# Patient Record
Sex: Male | Born: 1950 | Race: Black or African American | Hispanic: No | Marital: Married | Smoking: Former smoker
Health system: Southern US, Community
[De-identification: ages and names within clinical notes are randomized; demographics above are authoritative.]

## PROBLEM LIST (undated history)

## (undated) DIAGNOSIS — I2510 Atherosclerotic heart disease of native coronary artery without angina pectoris: Secondary | ICD-10-CM

## (undated) DIAGNOSIS — N529 Male erectile dysfunction, unspecified: Secondary | ICD-10-CM

## (undated) DIAGNOSIS — I739 Peripheral vascular disease, unspecified: Secondary | ICD-10-CM

## (undated) DIAGNOSIS — E785 Hyperlipidemia, unspecified: Secondary | ICD-10-CM

## (undated) DIAGNOSIS — M79609 Pain in unspecified limb: Secondary | ICD-10-CM

## (undated) DIAGNOSIS — I10 Essential (primary) hypertension: Secondary | ICD-10-CM

## (undated) DIAGNOSIS — K5900 Constipation, unspecified: Secondary | ICD-10-CM

## (undated) DIAGNOSIS — G709 Myoneural disorder, unspecified: Secondary | ICD-10-CM

## (undated) HISTORY — DX: Pain in unspecified limb: M79.609

## (undated) HISTORY — DX: Hyperlipidemia, unspecified: E78.5

## (undated) HISTORY — DX: Atherosclerotic heart disease of native coronary artery without angina pectoris: I25.10

## (undated) HISTORY — DX: Male erectile dysfunction, unspecified: N52.9

## (undated) HISTORY — DX: Essential (primary) hypertension: I10

## (undated) HISTORY — PX: BACK SURGERY: SHX140

## (undated) HISTORY — DX: Myoneural disorder, unspecified: G70.9

## (undated) HISTORY — DX: Constipation, unspecified: K59.00

## (undated) HISTORY — DX: Peripheral vascular disease, unspecified: I73.9

## (undated) HISTORY — PX: LUMBAR DISC SURGERY: SHX700

---

## 2002-03-02 ENCOUNTER — Encounter: Admission: RE | Admit: 2002-03-02 | Discharge: 2002-03-02 | Payer: Self-pay | Admitting: Family Medicine

## 2002-03-02 ENCOUNTER — Encounter: Payer: Self-pay | Admitting: Family Medicine

## 2002-03-08 ENCOUNTER — Encounter: Payer: Self-pay | Admitting: Family Medicine

## 2002-03-08 ENCOUNTER — Ambulatory Visit (HOSPITAL_COMMUNITY): Admission: RE | Admit: 2002-03-08 | Discharge: 2002-03-08 | Payer: Self-pay | Admitting: Family Medicine

## 2006-11-13 ENCOUNTER — Emergency Department (HOSPITAL_COMMUNITY): Admission: EM | Admit: 2006-11-13 | Discharge: 2006-11-13 | Payer: Self-pay | Admitting: Family Medicine

## 2010-04-09 ENCOUNTER — Emergency Department (HOSPITAL_COMMUNITY): Admission: EM | Admit: 2010-04-09 | Discharge: 2010-04-09 | Payer: Self-pay | Admitting: Family Medicine

## 2010-04-19 ENCOUNTER — Emergency Department (HOSPITAL_COMMUNITY): Admission: EM | Admit: 2010-04-19 | Discharge: 2010-04-19 | Payer: Self-pay | Admitting: Family Medicine

## 2010-05-01 ENCOUNTER — Ambulatory Visit (HOSPITAL_COMMUNITY): Admission: RE | Admit: 2010-05-01 | Discharge: 2010-05-01 | Payer: Self-pay | Admitting: *Deleted

## 2011-07-09 ENCOUNTER — Inpatient Hospital Stay (INDEPENDENT_AMBULATORY_CARE_PROVIDER_SITE_OTHER)
Admission: RE | Admit: 2011-07-09 | Discharge: 2011-07-09 | Disposition: A | Discharge status: Home / Self Care | Payer: BC Managed Care – PPO | Source: Ambulatory Visit | Attending: Family Medicine | Admitting: Family Medicine

## 2011-07-09 DIAGNOSIS — S058X9A Other injuries of unspecified eye and orbit, initial encounter: Secondary | ICD-10-CM

## 2013-05-17 ENCOUNTER — Encounter: Payer: Self-pay | Admitting: Vascular Surgery

## 2013-05-23 ENCOUNTER — Encounter: Payer: Self-pay | Admitting: Vascular Surgery

## 2013-05-24 ENCOUNTER — Encounter: Payer: Self-pay | Admitting: Vascular Surgery

## 2013-05-24 ENCOUNTER — Ambulatory Visit (INDEPENDENT_AMBULATORY_CARE_PROVIDER_SITE_OTHER): Payer: BC Managed Care – PPO | Admitting: Vascular Surgery

## 2013-05-24 VITALS — BP 139/86 | HR 87 | Resp 16 | Ht 68.0 in | Wt 155.0 lb

## 2013-05-24 DIAGNOSIS — I739 Peripheral vascular disease, unspecified: Secondary | ICD-10-CM

## 2013-05-24 DIAGNOSIS — I70219 Atherosclerosis of native arteries of extremities with intermittent claudication, unspecified extremity: Secondary | ICD-10-CM

## 2013-05-24 DIAGNOSIS — Z01810 Encounter for preprocedural cardiovascular examination: Secondary | ICD-10-CM

## 2013-05-24 NOTE — Progress Notes (Signed)
Subjective:     Patient ID: Tyrone Walton, male   DOB: 05/06/1951, 62 y.o.   MRN: [id number]  HPI this 62 year old male was referred by Kathrin Penner practitioner for evaluation of lower extremity occlusive disease. Patient states that he has been having bilateral calf weakness with ambulation over the last several years which has become worse in the past year. He denies rest pain but does have numbness and tingling in his feet frequently. He has no history of nonhealing ulcers or gangrene. He is able to ambulate 1-2 blocks and this does not seem to be limiting him currently. He also states that he does get chest pain on occasion while mowing the lawn which is relieved by rest. He has no diagnosis of coronary artery disease and has no history of myocardial infarction. He had ABIs performed at [facility] office but those are not available today and nor are they in Epic  Past Medical History  Diagnosis Date  . Peripheral vascular disease   . Hypertension   . Arthritis     Gout  . CAD (coronary artery disease)   . Hyperlipidemia   . Pain in limb   . Constipation   . ED (erectile dysfunction)     History  Substance Use Topics  . Smoking status: Not on file  . Smokeless tobacco: Not on file  . Alcohol Use: Not on file    No family history on file.  No Known Allergies  Current outpatient prescriptions:atorvastatin (LIPITOR) 10 MG tablet, Take 10 mg by mouth daily., Disp: , Rfl: ;  cholecalciferol (VITAMIN D) 400 UNITS TABS tablet, Take 400 Units by mouth every other day., Disp: , Rfl: ;  tadalafil (CIALIS) 20 MG tablet, Take 20 mg by mouth daily as needed for erectile dysfunction., Disp: , Rfl: ;  valsartan-hydrochlorothiazide (DIOVAN-HCT) 320-25 MG per tablet, Take 1 tablet by mouth daily., Disp: , Rfl:   BP 139/86  Pulse 87  Resp 16  Ht 5\' 8"  (1.727 m)  Wt 155 lb (70.308 kg)  BMI 23.57 kg/m2  Body mass index is 23.57 kg/(m^2).          Review of Systems  complains of mild chest discomfort with exertion but denies dyspnea on exertion, PND, orthopnea, hemoptysis, productive cough. All other systems are negative and a complete review of systems except for history of lumbar spine disease    Objective:   Physical Exam BP 139/86  Pulse 87  Resp 16  Ht 5\' 8"  (1.727 m)  Wt 155 lb (70.308 kg)  BMI 23.57 kg/m2  Gen.-alert and oriented x3 in no apparent distress HEENT normal for age Lungs no rhonchi or wheezing Cardiovascular regular rhythm no murmurs carotid pulses 3+ palpable no bruits audible Abdomen soft nontender no palpable masses Musculoskeletal free of  major deformities Skin clear -no rashes Neurologic normal Lower extremities 2+ femoral pulses palpable bilaterally. No popliteal or distal pulses palpable bilaterally. 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 Suspect coronary artery disease with possible angina pectoris    Plan:     #1 we'll obtain Cardiolite nuclear medicine scan #2 encourage patient to ambulate as much as possible over the next 3 months #3 encouraged patient to discontinue smoking because of the diffuse atherosclerosis which he  has #4 return in 3 months with repeat ABIs and duplex scan of aortoiliac and femoral popliteal system bilaterally If patient's symptoms become worse or he develops any infection or ulceration he will be in touch with [place] Patient is to notify Debria Garret nurse practitioner if he has any more chest discomfort for further evaluation and in the meantime we'll be obtaining the Cardiolite exam

## 2013-05-25 NOTE — Addendum Note (Signed)
Addended by: Sharee Pimple on: 05/25/2013 09:15 AM   Modules accepted: Orders

## 2013-06-01 ENCOUNTER — Ambulatory Visit (HOSPITAL_COMMUNITY): Payer: BC Managed Care – PPO | Attending: Cardiovascular Disease | Admitting: Radiology

## 2013-06-01 VITALS — BP 107/64 | Ht 68.0 in | Wt 148.0 lb

## 2013-06-01 DIAGNOSIS — F17200 Nicotine dependence, unspecified, uncomplicated: Secondary | ICD-10-CM | POA: Insufficient documentation

## 2013-06-01 DIAGNOSIS — I70219 Atherosclerosis of native arteries of extremities with intermittent claudication, unspecified extremity: Secondary | ICD-10-CM

## 2013-06-01 DIAGNOSIS — Z01810 Encounter for preprocedural cardiovascular examination: Secondary | ICD-10-CM | POA: Insufficient documentation

## 2013-06-01 DIAGNOSIS — R079 Chest pain, unspecified: Secondary | ICD-10-CM | POA: Insufficient documentation

## 2013-06-01 DIAGNOSIS — I10 Essential (primary) hypertension: Secondary | ICD-10-CM | POA: Insufficient documentation

## 2013-06-01 DIAGNOSIS — E785 Hyperlipidemia, unspecified: Secondary | ICD-10-CM | POA: Insufficient documentation

## 2013-06-01 DIAGNOSIS — I739 Peripheral vascular disease, unspecified: Secondary | ICD-10-CM | POA: Insufficient documentation

## 2013-06-01 MED ORDER — TECHNETIUM TC 99M SESTAMIBI GENERIC - CARDIOLITE
10.0000 | Freq: Once | INTRAVENOUS | Status: AC | PRN
  Administered 2013-06-01: 10 via INTRAVENOUS

## 2013-06-01 MED ORDER — TECHNETIUM TC 99M SESTAMIBI GENERIC - CARDIOLITE
30.0000 | Freq: Once | INTRAVENOUS | Status: AC | PRN
  Administered 2013-06-01: 30 via INTRAVENOUS

## 2013-06-01 MED ORDER — REGADENOSON 0.4 MG/5ML IV SOLN
0.4000 mg | Freq: Once | INTRAVENOUS | Status: AC
  Administered 2013-06-01: 0.4 mg via INTRAVENOUS

## 2013-06-01 NOTE — Progress Notes (Signed)
[facility] SITE 3 NUCLEAR MED [address] [place], [place] [address] [phone]    Cardiology Nuclear Med Study  Tyrone Walton is a 62 y.o. male     MRN : [id number]     DOB: 03/27/1951  Procedure Date: 06/01/2013  Nuclear Med Background Indication for Stress Test:  Evaluation for Ischemia, and Surgical Clearance for  Potential Lower Extremity Vascular Surgery by Josephina Gip, MD History:  No hx of CAD Cardiac Risk Factors: Claudication, Hypertension, Lipids, PVD and Smoker  Symptoms:  Chest Pain   Nuclear Pre-Procedure Caffeine/Decaff Intake:  None > 12 hrs NPO After: 8:30pm   Lungs:  clear O2 Sat: 99% on room air. IV 0.9% NS with Angio Cath:  20g  IV Site: R Antecubital x 1, tolerated well IV Started by:  Irean Hong, RN  Chest Size (in):  42 Cup Size: n/a  Height: 5\' 8"  (1.727 m)  Weight:  148 lb (67.132 kg)  BMI:  Body mass index is 22.51 kg/(m^2). Tech Comments:  n/a    Nuclear Med Study 1 or 2 day study: 1 day  Stress Test Type:  Lexiscan  Reading MD: Charlton Haws, MD  Order Authorizing Provider:  Josephina Gip, MD  Resting Radionuclide: Technetium 51m Sestamibi  Resting Radionuclide Dose: 11.0 mCi   Stress Radionuclide:  Technetium 58m Sestamibi  Stress Radionuclide Dose: 33.0 mCi           Stress Protocol Rest HR: 56 Stress HR: 112  Rest BP: 107/64 Stress BP: 124/59  Exercise Time (min): 2:00 METS: 1.6   Predicted Max HR: 158 bpm % Max HR: 72.15 bpm Rate Pressure Product: 69629   Dose of Adenosine (mg):  n/a Dose of Lexiscan: 0.4 mg  Dose of Atropine (mg): n/a Dose of Dobutamine: n/a mcg/kg/min (at max HR)  Stress Test Technologist: Milana Na, EMT-P  Nuclear Technologist:  Doyne Keel, CNMT     Rest Procedure:  Myocardial perfusion imaging was performed at rest 45 minutes following the intravenous administration of Technetium 37m Sestamibi. Rest ECG: NSR - Normal EKG  Stress Procedure:  The patient received IV Lexiscan 0.4 mg  over 15-seconds with concurrent low level exercise and then Technetium 99m Sestamibi was injected at 30-seconds while the patient continued walking one more minute.  Quantitative spect images were obtained after a 45-minute delay. Stress ECG: No significant change from baseline ECG  QPS Raw Data Images:  Normal; no motion artifact; normal heart/lung ratio. Stress Images:  Normal homogeneous uptake in all areas of the myocardium. Rest Images:  Normal homogeneous uptake in all areas of the myocardium. Subtraction (SDS):  No evidence of ischemia. Transient Ischemic Dilatation (Normal <1.22):  n/a Lung/Heart Ratio (Normal <0.45):  0.35  Quantitative Gated Spect Images QGS EDV:  85 ml QGS ESV:  37 ml  Impression Exercise Capacity:  Lexiscan with low level exercise. BP Response:  Normal blood pressure response. Clinical Symptoms:  Nausea and throat tightness ECG Impression:  No significant ST segment change suggestive of ischemia. Comparison with Prior Nuclear Study: No images to compare  Overall Impression:  Normal stress nuclear study.  LV Ejection Fraction: 56%.  LV Wall Motion:  NL LV Function; NL Wall Motion   Charlton Haws

## 2013-06-06 ENCOUNTER — Encounter (HOSPITAL_COMMUNITY): Payer: BC Managed Care – PPO

## 2013-06-06 ENCOUNTER — Other Ambulatory Visit: Payer: Self-pay | Admitting: *Deleted

## 2013-06-06 DIAGNOSIS — Z48812 Encounter for surgical aftercare following surgery on the circulatory system: Secondary | ICD-10-CM

## 2013-06-06 DIAGNOSIS — I739 Peripheral vascular disease, unspecified: Secondary | ICD-10-CM

## 2013-08-12 ENCOUNTER — Other Ambulatory Visit: Payer: Self-pay | Admitting: Vascular Surgery

## 2013-08-12 DIAGNOSIS — I70219 Atherosclerosis of native arteries of extremities with intermittent claudication, unspecified extremity: Secondary | ICD-10-CM

## 2013-08-23 ENCOUNTER — Ambulatory Visit: Payer: BC Managed Care – PPO | Admitting: Vascular Surgery

## 2013-08-23 ENCOUNTER — Encounter (HOSPITAL_COMMUNITY): Payer: BC Managed Care – PPO

## 2013-08-23 ENCOUNTER — Other Ambulatory Visit (HOSPITAL_COMMUNITY): Payer: BC Managed Care – PPO

## 2013-08-29 ENCOUNTER — Encounter: Payer: Self-pay | Admitting: Vascular Surgery

## 2013-08-30 ENCOUNTER — Ambulatory Visit (INDEPENDENT_AMBULATORY_CARE_PROVIDER_SITE_OTHER): Payer: BC Managed Care – PPO | Admitting: Vascular Surgery

## 2013-08-30 ENCOUNTER — Ambulatory Visit: Payer: BC Managed Care – PPO | Admitting: Vascular Surgery

## 2013-08-30 ENCOUNTER — Ambulatory Visit (INDEPENDENT_AMBULATORY_CARE_PROVIDER_SITE_OTHER)
Admission: RE | Admit: 2013-08-30 | Discharge: 2013-08-30 | Disposition: A | Discharge status: Home / Self Care | Payer: BC Managed Care – PPO | Source: Ambulatory Visit | Attending: Vascular Surgery | Admitting: Vascular Surgery

## 2013-08-30 ENCOUNTER — Ambulatory Visit (HOSPITAL_COMMUNITY)
Admission: RE | Admit: 2013-08-30 | Discharge: 2013-08-30 | Disposition: A | Discharge status: Home / Self Care | Payer: BC Managed Care – PPO | Source: Ambulatory Visit | Attending: Vascular Surgery | Admitting: Vascular Surgery

## 2013-08-30 ENCOUNTER — Encounter (INDEPENDENT_AMBULATORY_CARE_PROVIDER_SITE_OTHER): Payer: Self-pay

## 2013-08-30 ENCOUNTER — Encounter: Payer: Self-pay | Admitting: Vascular Surgery

## 2013-08-30 VITALS — BP 141/86 | HR 66 | Ht 68.0 in | Wt 154.5 lb

## 2013-08-30 DIAGNOSIS — I739 Peripheral vascular disease, unspecified: Secondary | ICD-10-CM

## 2013-08-30 DIAGNOSIS — I70219 Atherosclerosis of native arteries of extremities with intermittent claudication, unspecified extremity: Secondary | ICD-10-CM

## 2013-08-30 DIAGNOSIS — Z48812 Encounter for surgical aftercare following surgery on the circulatory system: Secondary | ICD-10-CM | POA: Insufficient documentation

## 2013-08-30 NOTE — Progress Notes (Signed)
Subjective:     Patient ID: Tyrone Walton, male   DOB: 03-Jul-1951, 62 y.o.   MRN: [id number]  HPI this 62 year old male returns for continued followup regarding his bilateral calf claudication. He states his right leg is worse in his left he is able to ambulate about one block. He denies rest pain or nonhealing ulcers. He does continue to smoke one pack of cigarettes per day. He does have a history of occasional chest discomfort but a Cardiolite study was obtained over the last few months and it was normal with a good ejection fraction and no evidence of ischemia.  Past Medical History  Diagnosis Date  . Peripheral vascular disease   . Hypertension   . Arthritis     Gout  . CAD (coronary artery disease)   . Hyperlipidemia   . Pain in limb   . Constipation   . ED (erectile dysfunction)     History  Substance Use Topics  . Smoking status: Current Every Day Smoker -- 1.00 packs/day for 47 years    Types: Cigarettes  . Smokeless tobacco: Never Used  . Alcohol Use: 7.2 oz/week    12 Cans of beer per week    No family history on file.  No Known Allergies  Current outpatient prescriptions:atorvastatin (LIPITOR) 10 MG tablet, Take 10 mg by mouth daily., Disp: , Rfl: ;  cholecalciferol (VITAMIN D) 400 UNITS TABS tablet, Take 400 Units by mouth every other day., Disp: , Rfl: ;  cyclobenzaprine (FLEXERIL) 5 MG tablet, Take 5 mg by mouth as needed for muscle spasms., Disp: , Rfl: ;  tadalafil (CIALIS) 20 MG tablet, Take 20 mg by mouth daily as needed for erectile dysfunction., Disp: , Rfl:  valsartan-hydrochlorothiazide (DIOVAN-HCT) 320-25 MG per tablet, Take 1 tablet by mouth daily., Disp: , Rfl:   BP 141/86  Pulse 66  Ht 5\' 8"  (1.727 m)  Wt 154 lb 8 oz (70.081 kg)  BMI 23.50 kg/m2  SpO2 100%  Body mass index is 23.5 kg/(m^2).           Review of Systems denies dyspnea on exertion hip or thigh claudication. Occasional chest discomfort. His pain left heel from a callus.  Other systems negative complete review of systems    Objective:   Physical Exam BP 141/86  Pulse 66  Ht 5\' 8"  (1.727 m)  Wt 154 lb 8 oz (70.081 kg)  BMI 23.50 kg/m2  SpO2 100%  Gen.-alert and oriented x3 in no apparent distress HEENT normal for age Lungs no rhonchi or wheezing Cardiovascular regular rhythm no murmurs carotid pulses 3+ palpable no bruits audible Abdomen soft nontender no palpable masses Musculoskeletal free of  major deformities Skin clear -no rashes Neurologic normal Lower extremities -2-3+ femoral pulses palpable bilaterally. No popliteal or distal pulses. No evidence of ischemia of lower extremities. No infection or gangrene.  Today I ordered bilateral ABIs and duplex scan of the lower extremities which are reviewed and interpreted. ABI on the right is 0.76 normal left is 0.6. It appears from duplex scanning of the left superficial femoral artery is totally occluded in the mid thigh in the right superficial femoral artery has diffuse disease less than 50% in severity. Aortoiliac system appears widely patent.        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 Patient will return in 6 months for  repeat ABIs unless his symptoms become disabling and will make concerted effort to discontinue smoking    Plan:

## 2013-09-07 NOTE — Addendum Note (Signed)
Addended by: Melodye Ped C on: 09/07/2013 11:45 AM   Modules accepted: Orders

## 2014-02-24 ENCOUNTER — Encounter: Payer: Self-pay | Admitting: Family

## 2014-02-27 ENCOUNTER — Ambulatory Visit: Payer: BC Managed Care – PPO | Admitting: Family

## 2014-02-27 ENCOUNTER — Encounter (HOSPITAL_COMMUNITY): Payer: BC Managed Care – PPO

## 2014-10-16 ENCOUNTER — Emergency Department (HOSPITAL_COMMUNITY)
Admission: EM | Admit: 2014-10-16 | Discharge: 2014-10-16 | Disposition: A | Discharge status: Home / Self Care | Payer: BC Managed Care – PPO | Attending: Emergency Medicine | Admitting: Emergency Medicine

## 2014-10-16 ENCOUNTER — Encounter (HOSPITAL_COMMUNITY): Payer: Self-pay | Admitting: Cardiology

## 2014-10-16 DIAGNOSIS — E785 Hyperlipidemia, unspecified: Secondary | ICD-10-CM | POA: Insufficient documentation

## 2014-10-16 DIAGNOSIS — Y9289 Other specified places as the place of occurrence of the external cause: Secondary | ICD-10-CM | POA: Insufficient documentation

## 2014-10-16 DIAGNOSIS — Y9389 Activity, other specified: Secondary | ICD-10-CM | POA: Insufficient documentation

## 2014-10-16 DIAGNOSIS — X58XXXA Exposure to other specified factors, initial encounter: Secondary | ICD-10-CM | POA: Insufficient documentation

## 2014-10-16 DIAGNOSIS — Z87438 Personal history of other diseases of male genital organs: Secondary | ICD-10-CM | POA: Insufficient documentation

## 2014-10-16 DIAGNOSIS — Z8719 Personal history of other diseases of the digestive system: Secondary | ICD-10-CM | POA: Diagnosis not present

## 2014-10-16 DIAGNOSIS — Y990 Civilian activity done for income or pay: Secondary | ICD-10-CM | POA: Diagnosis not present

## 2014-10-16 DIAGNOSIS — Z79899 Other long term (current) drug therapy: Secondary | ICD-10-CM | POA: Insufficient documentation

## 2014-10-16 DIAGNOSIS — I2510 Atherosclerotic heart disease of native coronary artery without angina pectoris: Secondary | ICD-10-CM | POA: Diagnosis not present

## 2014-10-16 DIAGNOSIS — Z720 Tobacco use: Secondary | ICD-10-CM | POA: Insufficient documentation

## 2014-10-16 DIAGNOSIS — S3992XA Unspecified injury of lower back, initial encounter: Secondary | ICD-10-CM | POA: Diagnosis present

## 2014-10-16 DIAGNOSIS — I10 Essential (primary) hypertension: Secondary | ICD-10-CM | POA: Insufficient documentation

## 2014-10-16 DIAGNOSIS — Z8739 Personal history of other diseases of the musculoskeletal system and connective tissue: Secondary | ICD-10-CM | POA: Insufficient documentation

## 2014-10-16 DIAGNOSIS — S29012A Strain of muscle and tendon of back wall of thorax, initial encounter: Secondary | ICD-10-CM | POA: Insufficient documentation

## 2014-10-16 MED ORDER — CYCLOBENZAPRINE HCL 10 MG PO TABS: 10.0000 mg | ORAL_TABLET | Freq: Two times a day (BID) | ORAL | Status: DC | PRN

## 2014-10-16 MED ORDER — NAPROXEN 250 MG PO TABS
500.0000 mg | ORAL_TABLET | Freq: Once | ORAL | Status: AC
  Administered 2014-10-16: 500 mg via ORAL
  Filled 2014-10-16: qty 2

## 2014-10-16 MED ORDER — NAPROXEN 500 MG PO TABS: 500.0000 mg | ORAL_TABLET | Freq: Two times a day (BID) | ORAL | Status: DC

## 2014-10-16 MED ORDER — CYCLOBENZAPRINE HCL 10 MG PO TABS
10.0000 mg | ORAL_TABLET | Freq: Once | ORAL | Status: AC
  Administered 2014-10-16: 10 mg via ORAL
  Filled 2014-10-16: qty 1

## 2014-10-16 NOTE — ED Notes (Signed)
Declined W/C at D/C and was escorted to lobby by RN. 

## 2014-10-16 NOTE — ED Provider Notes (Signed)
CSN: [id number]     Arrival date & time 10/16/14  1341 History  This chart was scribed for non-physician practitioner, Clemens Catholic, NP working with Veryl Speak, MD, by Erling Conte, ED Scribe. This patient was seen in room TR05C/TR05C and the patient's care was started at 2:18 PM.    Chief Complaint  Patient presents with  . Back Pain    The history is provided by the patient. No language interpreter was used.    HPI Comments: Tyrone Walton is a 64 y.o. male with a h/o PVD, HTN, arthritis, CAD, and hyperlipidemia, who presents to the Emergency Department complaining of intermittent, moderate, "sharp", "7/10", back pain that began 10 days ago. Pt states that he felt his muscle pull at work 10 days ago while he was moving to pick something up. He states that the pain is exacerbated with certain movements. He notes he took some BC powder which provided relief. He notes that he has been moving furniture over the past week and he went to work yesterday and the back pain began to hurt again. Pt has a h/o swollen prostate He has been taking muscle relaxer's around 4 hours ago, that he got from his brother in law with relief. . He has also been applying a heating pad with some relief. Pt is able to walk. He denies any numbness, weakness, fever, chills, urinary or bowel incontinence, or saddle paresthesia.      Past Medical History  Diagnosis Date  . Peripheral vascular disease   . Hypertension   . Arthritis     Gout  . CAD (coronary artery disease)   . Hyperlipidemia   . Pain in limb   . Constipation   . ED (erectile dysfunction)    Past Surgical History  Procedure Laterality Date  . Back surgery     History reviewed. No pertinent family history. History  Substance Use Topics  . Smoking status: Current Every Day Smoker -- 1.00 packs/day for 47 years    Types: Cigarettes  . Smokeless tobacco: Never Used  . Alcohol Use: 7.2 oz/week    12 Cans of beer per week    Review of  Systems  Constitutional: Negative for fever and chills.  Gastrointestinal:       No bowel incontinence  Genitourinary:       No urinary incontinence  Musculoskeletal: Positive for back pain. Negative for gait problem.  Neurological: Negative for weakness and numbness.      Allergies  Review of patient's allergies indicates no known allergies.  Home Medications   Prior to Admission medications   Medication Sig Start Date End Date Taking? Authorizing Provider  atorvastatin (LIPITOR) 10 MG tablet Take 10 mg by mouth daily.    Historical Provider, MD  cholecalciferol (VITAMIN D) 400 UNITS TABS tablet Take 400 Units by mouth every other day.    Historical Provider, MD  cyclobenzaprine (FLEXERIL) 5 MG tablet Take 5 mg by mouth as needed for muscle spasms.    Historical Provider, MD  tadalafil (CIALIS) 20 MG tablet Take 20 mg by mouth daily as needed for erectile dysfunction.    Historical Provider, MD  valsartan-hydrochlorothiazide (DIOVAN-HCT) 320-25 MG per tablet Take 1 tablet by mouth daily.    Historical Provider, MD   Triage Vitals: BP 142/88 mmHg  Pulse 86  Temp(Src) 97.9 F (36.6 C) (Oral)  Resp 18  Ht 5\' 8"  (1.727 m)  Wt 150 lb (68.04 kg)  BMI 22.81 kg/m2  SpO2 100%  Physical Exam  Constitutional: He is oriented to person, place, and time. He appears well-developed and well-nourished. No distress.  HENT:  Head: Normocephalic and atraumatic.  Eyes: Conjunctivae and EOM are normal.  Neck: Neck supple. No tracheal deviation present.  Cardiovascular: Normal rate.   Pulmonary/Chest: Effort normal. No respiratory distress.  Musculoskeletal: Normal range of motion.  Tenderness to palpation of left latissimus dorsi. 5/5 strength plantar and dorsiflexion  Neurological: He is alert and oriented to person, place, and time.  Skin: Skin is warm and dry.  Psychiatric: He has a normal mood and affect. His behavior is normal.  Nursing note and vitals reviewed.   ED Course   Procedures (including critical care time)  DIAGNOSTIC STUDIES: Oxygen Saturation is 100% on RA, normal by my interpretation.    COORDINATION OF CARE: 2:24 PM-  Advised pt to take prescribed medications and continue applying heat.  Pt advised of plan for treatment and pt agrees.   Labs Review Labs Reviewed - No data to display  Imaging Review No results found.    EKG Interpretation None      MDM   Final diagnoses:  Strain of latissimus dorsi muscle, initial encounter   64 yo with history and exam consistent with muscle strain.  He has exquisite and reproducible tenderness to left latissimus dorsi muscle.  He has no radiation of pain, no abd pain, no bony tenderness, and no complaints of pain elsewhere. Discussed rest, NSAIDS and muscle relaxant.  Pt is well-appearing, in no acute distress and vital signs are stable.  They appear safe to be discharged.  Discharge include follow-up with their PCP.  Return precautions provided.   I personally performed the services described in this documentation, which was scribed in my presence. The recorded information has been reviewed and is accurate.  Filed Vitals:   10/16/14 1351  BP: 142/88  Pulse: 86  Temp: 97.9 F (36.6 C)  TempSrc: Oral  Resp: 18  Height: 5\' 8"  (1.727 m)  Weight: 150 lb (68.04 kg)  SpO2: 100%   Meds given in ED:  Medications  naproxen (NAPROSYN) tablet 500 mg (500 mg Oral Given 10/16/14 1448)  cyclobenzaprine (FLEXERIL) tablet 10 mg (10 mg Oral Given 10/16/14 1448)    Discharge Medication List as of 10/16/2014  2:36 PM    START taking these medications   Details  !! cyclobenzaprine (FLEXERIL) 10 MG tablet Take 1 tablet (10 mg total) by mouth 2 (two) times daily as needed for muscle spasms., Starting 10/16/2014, Until Discontinued, Print    naproxen (NAPROSYN) 500 MG tablet Take 1 tablet (500 mg total) by mouth 2 (two) times daily., Starting 10/16/2014, Until Discontinued, Print     !! - Potential duplicate  medications found. Please discuss with provider.         Britt Bottom, NP 10/18/14 2358  Veryl Speak, MD 10/20/14 0102

## 2014-10-16 NOTE — Discharge Instructions (Signed)
Please follow the directions provided. Be sure to follow-up with your primary care doctor to ensure you're getting better. You may take the naproxen twice a day to help with inflammation. You may take the Flexeril as needed twice a day for muscle spasms. Don't hesitate to return for any new, worsening, or concerning symptoms.  SEEK MEDICAL CARE IF:  You have increasing pain or swelling in the injured area.  You have numbness, tingling, or a significant loss of strength in the injured area.

## 2014-10-16 NOTE — ED Notes (Signed)
Pt reports that he pulled a muscle in his back yesterday at work and has tried OTC medication without much help.

## 2015-02-09 ENCOUNTER — Telehealth (HOSPITAL_COMMUNITY): Payer: Self-pay | Admitting: *Deleted

## 2015-02-15 ENCOUNTER — Telehealth (HOSPITAL_COMMUNITY): Payer: Self-pay | Admitting: *Deleted

## 2015-02-16 ENCOUNTER — Other Ambulatory Visit (HOSPITAL_COMMUNITY): Payer: Self-pay | Admitting: Foot & Ankle Surgery

## 2015-02-16 DIAGNOSIS — I739 Peripheral vascular disease, unspecified: Secondary | ICD-10-CM

## 2015-02-19 ENCOUNTER — Ambulatory Visit (HOSPITAL_COMMUNITY)
Admission: RE | Admit: 2015-02-19 | Discharge: 2015-02-19 | Disposition: A | Discharge status: Home / Self Care | Payer: BC Managed Care – PPO | Source: Ambulatory Visit | Attending: Cardiology | Admitting: Cardiology

## 2015-02-19 DIAGNOSIS — I739 Peripheral vascular disease, unspecified: Secondary | ICD-10-CM | POA: Diagnosis not present

## 2015-02-19 DIAGNOSIS — Z01810 Encounter for preprocedural cardiovascular examination: Secondary | ICD-10-CM | POA: Insufficient documentation

## 2015-02-19 DIAGNOSIS — F17200 Nicotine dependence, unspecified, uncomplicated: Secondary | ICD-10-CM | POA: Diagnosis not present

## 2015-02-19 NOTE — Progress Notes (Signed)
ABI Completed. Bilateral abnormal ABIs with the right side 0.61 and the left side 0.53. Oda Cogan, BS, RDMS, RVT

## 2015-03-13 ENCOUNTER — Encounter: Payer: Self-pay | Admitting: Cardiovascular Disease

## 2015-03-13 ENCOUNTER — Ambulatory Visit (INDEPENDENT_AMBULATORY_CARE_PROVIDER_SITE_OTHER): Payer: BC Managed Care – PPO | Admitting: Cardiovascular Disease

## 2015-03-13 VITALS — BP 132/90 | HR 88 | Ht 68.0 in | Wt 156.2 lb

## 2015-03-13 DIAGNOSIS — I739 Peripheral vascular disease, unspecified: Secondary | ICD-10-CM

## 2015-03-13 DIAGNOSIS — I10 Essential (primary) hypertension: Secondary | ICD-10-CM | POA: Insufficient documentation

## 2015-03-13 DIAGNOSIS — Z720 Tobacco use: Secondary | ICD-10-CM | POA: Insufficient documentation

## 2015-03-13 DIAGNOSIS — E785 Hyperlipidemia, unspecified: Secondary | ICD-10-CM | POA: Insufficient documentation

## 2015-03-13 HISTORY — DX: Tobacco use: Z72.0

## 2015-03-13 NOTE — Patient Instructions (Signed)
  We will see you back in follow up after the tests.   Dr Gwenlyn Found has ordered: Lower extremity arterial doppler- During this test, ultrasound is used to evaluate arterial blood flow in the legs. Allow approximately one hour for this exam.

## 2015-03-13 NOTE — Progress Notes (Signed)
03/13/2015 Tyrone Walton   31-Jul-1951  [id number]  Primary Physician Tyrone Walton ANN, NP Primary Cardiologist: Tyrone Harp MD Tyrone Walton   HPI:  Tyrone Walton is a 64 year old married African-American male who by his wife today. He was referred by Dr. Melony Walton , from [facility] podiatry, for peripheral vascular evaluation. He has a history of treated hypertension and hyperlipidemia. He has seen Dr. Kellie Walton in the past for peripheral evaluation because of claudication but Dopplers were unrevealing. He's had a negative Myoview stress test performed 06/02/13. His cardiac risk factors include treated hypertension, hyponatremia and tobacco abuse smoking one pack per day. His brother died of a myocardial infarction at age 30. He has never had a heart attack or stroke and denies chest pain or shortness of breath. He does complain of right greater than left lower extremity claudication which is lifestyle limiting of less than 100 yards. He has an abnormal right fifth toe nail and was scheduled to have this evaluated by Dr. Melony Walton mobility did not feel good peripheral pulses and referred him here for further evaluation.   Current Outpatient Prescriptions  Medication Sig Dispense Refill  . atorvastatin (LIPITOR) 10 MG tablet Take 10 mg by mouth daily.    . cholecalciferol (VITAMIN D) 400 UNITS TABS tablet Take 400 Units by mouth every other day.    . cyclobenzaprine (FLEXERIL) 5 MG tablet Take 5 mg by mouth as needed for muscle spasms.    . Hydrocodone-Acetaminophen 5-300 MG TABS Take 5-300 mg by mouth 4 (four) times daily as needed. Take 1 tab as needed up to 4 times daily  0  . magnesium oxide (MAG-OX) 400 MG tablet Take 400 mg by mouth daily.    . Multiple Vitamins-Minerals (ONE-A-DAY MENS 50+ ADVANTAGE) TABS Take 1 tablet by mouth daily.    . naproxen (NAPROSYN) 500 MG tablet Take 1 tablet (500 mg total) by mouth 2 (two) times daily. 30 tablet 0  . tadalafil (CIALIS) 20 MG tablet Take  20 mg by mouth daily as needed for erectile dysfunction.    . tamsulosin (FLOMAX) 0.4 MG CAPS capsule Take 0.4 mg by mouth at bedtime.  0  . valsartan-hydrochlorothiazide (DIOVAN-HCT) 320-25 MG per tablet Take 1 tablet by mouth daily.     No current facility-administered medications for this visit.    No Known Allergies  History   Social History  . Marital Status: Married    Spouse Name: N/A  . Number of Children: N/A  . Years of Education: N/A   Occupational History  . Not on file.   Social History Main Topics  . Smoking status: Current Every Day Smoker -- 1.00 packs/day for 47 years    Types: Cigarettes  . Smokeless tobacco: Never Used  . Alcohol Use: 7.2 oz/week    12 Cans of beer per week  . Drug Use: No  . Sexual Activity: Not on file   Other Topics Concern  . Not on file   Social History Narrative     Review of Systems: General: negative for chills, fever, night sweats or weight changes.  Cardiovascular: negative for chest pain, dyspnea on exertion, edema, orthopnea, palpitations, paroxysmal nocturnal dyspnea or shortness of breath Dermatological: negative for rash Respiratory: negative for cough or wheezing Urologic: negative for hematuria Abdominal: negative for nausea, vomiting, diarrhea, bright red blood per rectum, melena, or hematemesis Neurologic: negative for visual changes, syncope, or dizziness All other systems reviewed and are otherwise negative except as noted above.  Blood pressure 132/90, pulse 88, height 5\' 8"  (1.727 m), weight 156 lb 3.2 oz (70.852 kg).  General appearance: alert and no distress Neck: no adenopathy, no carotid bruit, no JVD, supple, symmetrical, trachea midline and thyroid not enlarged, symmetric, no tenderness/mass/nodules Lungs: clear to auscultation bilaterally Heart: regular rate and rhythm, S1, S2 normal, no murmur, click, rub or gallop Extremities: 2+ femorals with loud left and softer right common femoral bruits with  absent pedal pulses  EKG not performed today  ASSESSMENT AND PLAN:   PAD (peripheral artery disease) [name] what was referred to me by Dr. Melony Walton , [facility] podiatry, for evaluation and peripheral arterial disease. He has a right fifth toenail that requires local care.he does complain of right greater than left lower extremity lifestyle limiting claudication. He had Dopplers done in our office 02/19/15 revealed a right ABI 0.6 and a left upward 53. TBI's were not obtainable due to flattened waveforms. He had Dopplers performed 08/30/13 by Dr. Kellie Walton for symptoms of claudication but no significant obstructive lesions were noted. He does have a negative Myoview stress test performed 06/02/13. His risk factors include continued tobacco abuse one pack per day, treated hypertension and hyperlipidemia. I do hear bruits in both groins left lateral right with diminished pedal pulses. I am going to get lower extremity arterial Doppler studies and we'll see him back for further evaluation. In the interim, I do not think any procedure should be performed on his feet as I do not think he has adequate blood flow to heal.       Tyrone Harp MD [facility] 03/13/2015 11:35 AM

## 2015-03-13 NOTE — Assessment & Plan Note (Signed)
Tyrone Walton what was referred to me by Dr. Melony Overly , [facility] podiatry, for evaluation and peripheral arterial disease. He has a right fifth toenail that requires local care.he does complain of right greater than left lower extremity lifestyle limiting claudication. He had Dopplers done in our office 02/19/15 revealed a right ABI 0.6 and a left upward 53. TBI's were not obtainable due to flattened waveforms. He had Dopplers performed 08/30/13 by Dr. Kellie Simmering for symptoms of claudication but no significant obstructive lesions were noted. He does have a negative Myoview stress test performed 06/02/13. His risk factors include continued tobacco abuse one pack per day, treated hypertension and hyperlipidemia. I do hear bruits in both groins left lateral right with diminished pedal pulses. I am going to get lower extremity arterial Doppler studies and we'll see him back for further evaluation. In the interim, I do not think any procedure should be performed on his feet as I do not think he has adequate blood flow to heal.

## 2015-04-06 ENCOUNTER — Ambulatory Visit (HOSPITAL_COMMUNITY)
Admission: RE | Admit: 2015-04-06 | Discharge: 2015-04-06 | Disposition: A | Discharge status: Home / Self Care | Payer: BC Managed Care – PPO | Source: Ambulatory Visit | Attending: Cardiovascular Disease | Admitting: Cardiovascular Disease

## 2015-04-06 DIAGNOSIS — I70213 Atherosclerosis of native arteries of extremities with intermittent claudication, bilateral legs: Secondary | ICD-10-CM | POA: Insufficient documentation

## 2015-04-06 DIAGNOSIS — I739 Peripheral vascular disease, unspecified: Secondary | ICD-10-CM | POA: Diagnosis not present

## 2015-04-09 ENCOUNTER — Telehealth: Payer: Self-pay | Admitting: Cardiovascular Disease

## 2015-04-09 NOTE — Telephone Encounter (Signed)
Pt reminded of upcoming appt. At 11 am on 6/28 with Dr. Gwenlyn Found. Pt told that results had not been finalized for duplex completed on 6/24.  Pt agreed and stated he would work into his schedule to be there.

## 2015-04-09 NOTE — Telephone Encounter (Signed)
Pt returning call from earlier today. He thinks it is concerning his test results.

## 2015-04-10 ENCOUNTER — Encounter: Payer: Self-pay | Admitting: Cardiovascular Disease

## 2015-04-10 ENCOUNTER — Ambulatory Visit (INDEPENDENT_AMBULATORY_CARE_PROVIDER_SITE_OTHER): Payer: BC Managed Care – PPO | Admitting: Cardiovascular Disease

## 2015-04-10 VITALS — BP 118/72 | HR 80 | Ht 68.0 in | Wt 159.0 lb

## 2015-04-10 DIAGNOSIS — Z01818 Encounter for other preprocedural examination: Secondary | ICD-10-CM | POA: Diagnosis not present

## 2015-04-10 DIAGNOSIS — I739 Peripheral vascular disease, unspecified: Secondary | ICD-10-CM

## 2015-04-10 DIAGNOSIS — R5383 Other fatigue: Secondary | ICD-10-CM

## 2015-04-10 DIAGNOSIS — D689 Coagulation defect, unspecified: Secondary | ICD-10-CM | POA: Diagnosis not present

## 2015-04-10 DIAGNOSIS — I70219 Atherosclerosis of native arteries of extremities with intermittent claudication, unspecified extremity: Secondary | ICD-10-CM

## 2015-04-10 LAB — CBC
HCT: 43.5 % (ref 39.0–52.0)
Hemoglobin: 14.5 g/dL (ref 13.0–17.0)
MCH: 30 pg (ref 26.0–34.0)
MCHC: 33.3 g/dL (ref 30.0–36.0)
MCV: 90.1 fL (ref 78.0–100.0)
MPV: 9.1 fL (ref 8.6–12.4)
Platelets: 211 10*3/uL (ref 150–400)
RBC: 4.83 MIL/uL (ref 4.22–5.81)
RDW: 14.1 % (ref 11.5–15.5)
WBC: 7.6 10*3/uL (ref 4.0–10.5)

## 2015-04-10 LAB — BASIC METABOLIC PANEL
BUN: 15 mg/dL (ref 6–23)
CALCIUM: 9.3 mg/dL (ref 8.4–10.5)
CHLORIDE: 102 meq/L (ref 96–112)
CO2: 27 mEq/L (ref 19–32)
CREATININE: 0.99 mg/dL (ref 0.50–1.35)
Glucose, Bld: 83 mg/dL (ref 70–99)
POTASSIUM: 4.2 meq/L (ref 3.5–5.3)
SODIUM: 140 meq/L (ref 135–145)

## 2015-04-10 LAB — TSH: TSH: 0.929 u[IU]/mL (ref 0.350–4.500)

## 2015-04-10 NOTE — Assessment & Plan Note (Signed)
Tyrone Walton returns today for follow-up of his lower extremity Doppler studies which were performed 04/06/15 revealing ABIs in the 0.5-0.6 range with occluded SFAs bilaterally. He is symptomatic.I'm going to arrange for him to undergo peripheral angiography and endovascular therapy for lifestyle limiting claudication.

## 2015-04-10 NOTE — Progress Notes (Signed)
Mr Bodmer returns today for follow-up of his lower extremity Doppler studies which were performed 04/06/15 revealing ABIs in the 0.5-0.6 range with occluded SFAs bilaterally. He is symptomatic.I'm going to arrange for him to undergo peripheral angiography and endovascular therapy for lifestyle limiting claudication. The risks and benefits of the procedure have been explained to the patient's wife and they agree to proceed  Lorretta Harp, M.D., [place], [facility], [place], [place] [address] [place], [place]  [address]  [phone] 04/10/2015 11:46 AM

## 2015-04-10 NOTE — Patient Instructions (Addendum)
Dr. Gwenlyn Found has ordered a peripheral angiogram to be done at [facility] on thursday.  This procedure is going to look at the bloodflow in your lower extremities.  If Dr. Gwenlyn Found is able to open up the arteries, you will have to spend one night in the hospital.  If he is not able to open the arteries, you will be able to go home that same day.   After the procedure, you will not be allowed to drive for 3 days or push, pull, or lift anything greater than 10 lbs for one week.    You will be required to have the following tests prior to the procedure: 1. Blood work-the blood work can be done no more than 7 days prior to the procedure.  It can be done at any [facility] lab.  There is one downstairs on the first floor of this building and one on [employer] ([address]; phone number-[phone]) 2. Chest Xray-the chest xray order has already been placed at [place] [facility].     If you have any concerns regarding cost or copay please contact this number [phone].  *REPS Scott  right groin access

## 2015-04-11 ENCOUNTER — Ambulatory Visit
Admission: RE | Admit: 2015-04-11 | Discharge: 2015-04-11 | Disposition: A | Discharge status: Home / Self Care | Payer: BC Managed Care – PPO | Source: Ambulatory Visit | Attending: Cardiovascular Disease | Admitting: Cardiovascular Disease

## 2015-04-11 ENCOUNTER — Ambulatory Visit: Payer: BC Managed Care – PPO | Admitting: Cardiovascular Disease

## 2015-04-11 DIAGNOSIS — Z01818 Encounter for other preprocedural examination: Secondary | ICD-10-CM

## 2015-04-11 LAB — PROTIME-INR
INR: 0.91 (ref <1.50)
PROTHROMBIN TIME: 12.3 s (ref 11.6–15.2)

## 2015-04-11 LAB — APTT: aPTT: 28 seconds (ref 24–37)

## 2015-04-12 ENCOUNTER — Encounter (HOSPITAL_COMMUNITY)
Admission: RE | Disposition: A | Discharge status: Home / Self Care | Payer: BC Managed Care – PPO | Source: Ambulatory Visit | Attending: Cardiovascular Disease

## 2015-04-12 ENCOUNTER — Encounter (HOSPITAL_COMMUNITY): Payer: Self-pay | Admitting: Cardiovascular Disease

## 2015-04-12 ENCOUNTER — Ambulatory Visit (HOSPITAL_COMMUNITY)
Admission: RE | Admit: 2015-04-12 | Discharge: 2015-04-13 | Disposition: A | Discharge status: Home / Self Care | Payer: BC Managed Care – PPO | Source: Ambulatory Visit | Attending: Cardiovascular Disease | Admitting: Cardiovascular Disease

## 2015-04-12 DIAGNOSIS — I739 Peripheral vascular disease, unspecified: Secondary | ICD-10-CM | POA: Diagnosis present

## 2015-04-12 DIAGNOSIS — I70212 Atherosclerosis of native arteries of extremities with intermittent claudication, left leg: Secondary | ICD-10-CM | POA: Diagnosis not present

## 2015-04-12 DIAGNOSIS — I10 Essential (primary) hypertension: Secondary | ICD-10-CM | POA: Diagnosis present

## 2015-04-12 DIAGNOSIS — Z959 Presence of cardiac and vascular implant and graft, unspecified: Secondary | ICD-10-CM

## 2015-04-12 DIAGNOSIS — Z720 Tobacco use: Secondary | ICD-10-CM | POA: Diagnosis present

## 2015-04-12 DIAGNOSIS — E785 Hyperlipidemia, unspecified: Secondary | ICD-10-CM | POA: Diagnosis present

## 2015-04-12 DIAGNOSIS — I70219 Atherosclerosis of native arteries of extremities with intermittent claudication, unspecified extremity: Secondary | ICD-10-CM | POA: Diagnosis present

## 2015-04-12 HISTORY — PX: PERIPHERAL VASCULAR CATHETERIZATION: SHX172C

## 2015-04-12 LAB — POCT ACTIVATED CLOTTING TIME
ACTIVATED CLOTTING TIME: 177 s
ACTIVATED CLOTTING TIME: 270 s
Activated Clotting Time: 171 seconds
Activated Clotting Time: 233 seconds
Activated Clotting Time: 190 seconds
Activated Clotting Time: 165 seconds

## 2015-04-12 SURGERY — LOWER EXTREMITY ANGIOGRAPHY
Anesthesia: LOCAL

## 2015-04-12 MED ORDER — LIDOCAINE HCL (PF) 1 % IJ SOLN
INTRAMUSCULAR | Status: AC
  Filled 2015-04-12: qty 30

## 2015-04-12 MED ORDER — HYDROCODONE-ACETAMINOPHEN 5-325 MG PO TABS: 1.0000 | ORAL_TABLET | ORAL | Status: DC | PRN

## 2015-04-12 MED ORDER — IODIXANOL 320 MG/ML IV SOLN
INTRAVENOUS | Status: DC | PRN
Start: 2015-04-12 — End: 2015-04-12
  Administered 2015-04-12: 260 mL via INTRA_ARTERIAL

## 2015-04-12 MED ORDER — LIDOCAINE HCL (PF) 1 % IJ SOLN
INTRAMUSCULAR | Status: DC | PRN
  Administered 2015-04-12: 30 mL via INTRADERMAL

## 2015-04-12 MED ORDER — SODIUM CHLORIDE 0.9 % IV SOLN
INTRAVENOUS | Status: AC
  Administered 2015-04-12: 18:00:00 via INTRAVENOUS

## 2015-04-12 MED ORDER — ANGIOPLASTY BOOK
Freq: Once | Status: AC
  Administered 2015-04-12: 20:00:00
  Filled 2015-04-12: qty 1

## 2015-04-12 MED ORDER — ASPIRIN EC 325 MG PO TBEC
325.0000 mg | DELAYED_RELEASE_TABLET | Freq: Every day | ORAL | Status: DC
  Administered 2015-04-12 – 2015-04-13 (×2): 325 mg via ORAL
  Filled 2015-04-12 (×2): qty 1

## 2015-04-12 MED ORDER — CYCLOBENZAPRINE HCL 10 MG PO TABS: 5.0000 mg | ORAL_TABLET | ORAL | Status: DC | PRN

## 2015-04-12 MED ORDER — SODIUM CHLORIDE 0.9 % IJ SOLN: 3.0000 mL | INTRAMUSCULAR | Status: DC | PRN

## 2015-04-12 MED ORDER — NITROGLYCERIN 1 MG/10 ML FOR IR/CATH LAB
INTRA_ARTERIAL | Status: AC
  Filled 2015-04-12: qty 10

## 2015-04-12 MED ORDER — VARENICLINE TARTRATE 1 MG PO TABS
1.0000 mg | ORAL_TABLET | Freq: Two times a day (BID) | ORAL | Status: DC
  Administered 2015-04-12 – 2015-04-13 (×3): 1 mg via ORAL
  Filled 2015-04-12 (×4): qty 1

## 2015-04-12 MED ORDER — MORPHINE SULFATE 2 MG/ML IJ SOLN: 2.0000 mg | INTRAMUSCULAR | Status: DC | PRN

## 2015-04-12 MED ORDER — SODIUM CHLORIDE 0.9 % WEIGHT BASED INFUSION: 1.0000 mL/kg/h | INTRAVENOUS | Status: DC

## 2015-04-12 MED ORDER — FENTANYL CITRATE (PF) 100 MCG/2ML IJ SOLN
INTRAMUSCULAR | Status: AC
  Filled 2015-04-12: qty 2

## 2015-04-12 MED ORDER — CLOPIDOGREL BISULFATE 75 MG PO TABS
75.0000 mg | ORAL_TABLET | Freq: Every day | ORAL | Status: DC
  Administered 2015-04-13: 75 mg via ORAL
  Filled 2015-04-12: qty 1

## 2015-04-12 MED ORDER — HEPARIN SODIUM (PORCINE) 1000 UNIT/ML IJ SOLN
INTRAMUSCULAR | Status: DC | PRN
  Administered 2015-04-12: 3000 [IU] via INTRAVENOUS
  Administered 2015-04-12: 5000 [IU] via INTRAVENOUS
  Administered 2015-04-12: 3000 [IU] via INTRAVENOUS

## 2015-04-12 MED ORDER — CLOPIDOGREL BISULFATE 300 MG PO TABS
ORAL_TABLET | ORAL | Status: DC | PRN
  Administered 2015-04-12: 300 mg via ORAL

## 2015-04-12 MED ORDER — HEPARIN SODIUM (PORCINE) 1000 UNIT/ML IJ SOLN
INTRAMUSCULAR | Status: AC
  Filled 2015-04-12: qty 1

## 2015-04-12 MED ORDER — MIDAZOLAM HCL 2 MG/2ML IJ SOLN
INTRAMUSCULAR | Status: DC | PRN
  Administered 2015-04-12: 1 mg via INTRAVENOUS

## 2015-04-12 MED ORDER — HEPARIN (PORCINE) IN NACL 2-0.9 UNIT/ML-% IJ SOLN
INTRAMUSCULAR | Status: AC
  Filled 2015-04-12: qty 1000

## 2015-04-12 MED ORDER — NITROGLYCERIN 1 MG/10 ML FOR IR/CATH LAB
INTRA_ARTERIAL | Status: DC | PRN
  Administered 2015-04-12: 200 ug via INTRA_ARTERIAL

## 2015-04-12 MED ORDER — TAMSULOSIN HCL 0.4 MG PO CAPS
0.4000 mg | ORAL_CAPSULE | Freq: Every day | ORAL | Status: DC
  Administered 2015-04-12: 0.4 mg via ORAL
  Filled 2015-04-12: qty 1

## 2015-04-12 MED ORDER — ASPIRIN 81 MG PO CHEW
CHEWABLE_TABLET | ORAL | Status: AC
  Filled 2015-04-12: qty 1

## 2015-04-12 MED ORDER — MIDAZOLAM HCL 2 MG/2ML IJ SOLN
INTRAMUSCULAR | Status: AC
  Filled 2015-04-12: qty 2

## 2015-04-12 MED ORDER — ONDANSETRON HCL 4 MG/2ML IJ SOLN: 4.0000 mg | Freq: Four times a day (QID) | INTRAMUSCULAR | Status: DC | PRN

## 2015-04-12 MED ORDER — CLOPIDOGREL BISULFATE 300 MG PO TABS
ORAL_TABLET | ORAL | Status: AC
  Filled 2015-04-12: qty 1

## 2015-04-12 MED ORDER — SODIUM CHLORIDE 0.9 % WEIGHT BASED INFUSION
3.0000 mL/kg/h | INTRAVENOUS | Status: DC
  Administered 2015-04-12: 3 mL/kg/h via INTRAVENOUS

## 2015-04-12 MED ORDER — FENTANYL CITRATE (PF) 100 MCG/2ML IJ SOLN
INTRAMUSCULAR | Status: DC | PRN
  Administered 2015-04-12: 25 ug via INTRAVENOUS

## 2015-04-12 MED ORDER — ACETAMINOPHEN 325 MG PO TABS: 650.0000 mg | ORAL_TABLET | ORAL | Status: DC | PRN

## 2015-04-12 MED ORDER — ASPIRIN 81 MG PO CHEW
81.0000 mg | CHEWABLE_TABLET | ORAL | Status: AC
  Administered 2015-04-12: 81 mg via ORAL

## 2015-04-12 MED ORDER — ATORVASTATIN CALCIUM 10 MG PO TABS
10.0000 mg | ORAL_TABLET | Freq: Every day | ORAL | Status: DC
  Administered 2015-04-12 – 2015-04-13 (×2): 10 mg via ORAL
  Filled 2015-04-12 (×3): qty 1

## 2015-04-12 SURGICAL SUPPLY — 25 items
BALLOON POWERFLX PRO 5X20X80 (BALLOONS) ×3 IMPLANT
BALLOON POWERFLX PRO 7X40X135 (BALLOONS) ×1 IMPLANT
CATH ANGIO 5F PIGTAIL 65CM (CATHETERS) ×4 IMPLANT
CATH CROSS OVER TEMPO 5F (CATHETERS) ×2 IMPLANT
CATH STRAIGHT 5FR 65CM (CATHETERS) ×1 IMPLANT
CATH VIANCE CROSS STAND 150CM (MICROCATHETER) ×4
CATH VIANCE CROSS STD 150CM (MICROCATHETER) ×2 IMPLANT
GUIDEWIRE ASTATO XS 20G 300CM (WIRE) ×1 IMPLANT
HAND CONTROLLER AVANTA (MISCELLANEOUS) ×1 IMPLANT
KIT ENCORE 26 ADVANTAGE (KITS) ×1 IMPLANT
KIT PV (KITS) ×4 IMPLANT
SET AVANTA MULTI PATIENT (MISCELLANEOUS) IMPLANT
SHEATH AVANTA HAND CONTROLLER (MISCELLANEOUS) ×1 IMPLANT
SHEATH HIGHFLEX ANSEL 7FR 55CM (SHEATH) ×6 IMPLANT
SHEATH PINNACLE 5F 10CM (SHEATH) ×2 IMPLANT
SHEATH PINNACLE 7F 10CM (SHEATH) ×1 IMPLANT
STENT SMART CONTROL 8X40X120 (Permanent Stent) ×2 IMPLANT
STOPCOCK MORSE 400PSI 3WAY (MISCELLANEOUS) ×2 IMPLANT
TAPE RADIOPAQUE TURBO (MISCELLANEOUS) ×1 IMPLANT
TRANSDUCER W/STOPCOCK (MISCELLANEOUS) ×4 IMPLANT
TRAY PV CATH (CUSTOM PROCEDURE TRAY) ×4 IMPLANT
TUBING CIL FLEX 10 FLL-RA (TUBING) ×1 IMPLANT
WIRE HI TORQ VERSACORE J 260CM (WIRE) ×2 IMPLANT
WIRE HITORQ VERSACORE ST 145CM (WIRE) ×2 IMPLANT
WIRE VERSACORE LOC 115CM (WIRE) ×1 IMPLANT

## 2015-04-12 NOTE — H&P (View-Only) (Signed)
Tyrone Walton returns today for follow-up of his lower extremity Doppler studies which were performed 04/06/15 revealing ABIs in the 0.5-0.6 range with occluded SFAs bilaterally. He is symptomatic.I'm going to arrange for him to undergo peripheral angiography and endovascular therapy for lifestyle limiting claudication. The risks and benefits of the procedure have been explained to the patient's wife and they agree to proceed  Lorretta Harp, M.D., [place], [facility], [place], [address] [place], [place]  [address]  [phone] 04/10/2015 11:46 AM

## 2015-04-12 NOTE — Interval H&P Note (Signed)
History and Physical Interval Note:  04/12/2015 7:39 AM  Tyrone Walton  has presented today for surgery, with the diagnosis of pvd  The various methods of treatment have been discussed with the patient and family. After consideration of risks, benefits and other options for treatment, the patient has consented to  Procedure(s): Lower Extremity Angiography (N/A) as a surgical intervention .  The patient's history has been reviewed, patient examined, no change in status, stable for surgery.  I have reviewed the patient's chart and labs.  Questions were answered to the patient's satisfaction.     Quay Burow

## 2015-04-12 NOTE — Research (Signed)
SAFE-DCB Informed Consent   Subject Name: Tyrone Walton  Subject met inclusion and exclusion criteria.  The informed consent form, study requirements and expectations were reviewed with the subject and questions and concerns were addressed prior to the signing of the consent form.  The subject verbalized understanding of the trial requirements.  The subject agreed to participate in the SAFE-DCB trial and signed the informed consent.  The informed consent was obtained prior to performance of any protocol-specific procedures for the subject.  A copy of the signed informed consent was given to the subject and a copy was placed in the subject's medical record.  [name] 04/12/2015, 7:05 AM

## 2015-04-12 NOTE — Progress Notes (Signed)
Site area: right groin a 7 french sheath was moved  Site Prior to Removal:  Level 0  Pressure Applied For 20 MINUTES    Minutes Beginning at 1145a  Manual:   Yes.    Patient Status During Pull:  stable  Post Pull Groin Site:  Level 0  Post Pull Instructions Given:  Yes.    Post Pull Pulses Present:  Yes.    Dressing Applied:  Yes.    Comments:  VS remain stable during sheath pull.  Pt denies any this comfort at this time.

## 2015-04-13 ENCOUNTER — Encounter (HOSPITAL_COMMUNITY): Payer: Self-pay | Admitting: Cardiology

## 2015-04-13 DIAGNOSIS — I739 Peripheral vascular disease, unspecified: Secondary | ICD-10-CM

## 2015-04-13 DIAGNOSIS — Z959 Presence of cardiac and vascular implant and graft, unspecified: Secondary | ICD-10-CM

## 2015-04-13 DIAGNOSIS — I10 Essential (primary) hypertension: Secondary | ICD-10-CM

## 2015-04-13 DIAGNOSIS — I70219 Atherosclerosis of native arteries of extremities with intermittent claudication, unspecified extremity: Secondary | ICD-10-CM

## 2015-04-13 DIAGNOSIS — I70212 Atherosclerosis of native arteries of extremities with intermittent claudication, left leg: Secondary | ICD-10-CM | POA: Diagnosis not present

## 2015-04-13 LAB — CBC
HCT: 39.5 % (ref 39.0–52.0)
Hemoglobin: 13.4 g/dL (ref 13.0–17.0)
MCH: 29.6 pg (ref 26.0–34.0)
MCHC: 33.9 g/dL (ref 30.0–36.0)
MCV: 87.4 fL (ref 78.0–100.0)
Platelets: 186 10*3/uL (ref 150–400)
RBC: 4.52 MIL/uL (ref 4.22–5.81)
RDW: 14.3 % (ref 11.5–15.5)
WBC: 8.4 10*3/uL (ref 4.0–10.5)

## 2015-04-13 LAB — BASIC METABOLIC PANEL
Anion gap: 6 (ref 5–15)
BUN: 8 mg/dL (ref 6–20)
CALCIUM: 9 mg/dL (ref 8.9–10.3)
CO2: 26 mmol/L (ref 22–32)
Chloride: 107 mmol/L (ref 101–111)
Creatinine, Ser: 0.88 mg/dL (ref 0.61–1.24)
GFR calc Af Amer: >60 mL/min (ref >60)
GFR calc non Af Amer: >60 mL/min (ref >60)
Glucose, Bld: 107 mg/dL — ABNORMAL HIGH (ref 65–99)
POTASSIUM: 3.6 mmol/L (ref 3.5–5.1)
SODIUM: 139 mmol/L (ref 135–145)

## 2015-04-13 MED ORDER — CLOPIDOGREL BISULFATE 75 MG PO TABS: 75.0000 mg | ORAL_TABLET | Freq: Every day | ORAL | Status: DC

## 2015-04-13 MED ORDER — ACETAMINOPHEN 325 MG PO TABS: 650.0000 mg | ORAL_TABLET | ORAL | Status: AC | PRN

## 2015-04-13 MED ORDER — ASPIRIN 325 MG PO TBEC
325.0000 mg | DELAYED_RELEASE_TABLET | Freq: Every day | ORAL | Status: DC
Start: 2015-04-13 — End: 2015-05-11

## 2015-04-13 MED FILL — Heparin Sodium (Porcine) 2 Unit/ML in Sodium Chloride 0.9%: INTRAMUSCULAR | Qty: 1000 | Status: AC

## 2015-04-13 NOTE — Discharge Summary (Signed)
Physician Discharge Summary       Patient ID: Tyrone Walton MRN: [id number] DOB/AGE: 06/17/1951 64 y.o.  Admit date: 04/12/2015 Discharge date: 04/13/2015 Primary Cardiologist:Dr. Gwenlyn Walton   Discharge Diagnoses:  Principal Problem:   Claudication Active Problems:   PAD (peripheral artery disease)   S/P arterial stent-04/12/15 PTA/Stent of Lt ext iliac   Atherosclerosis of native arteries of extremity with intermittent claudication   Essential hypertension   Hyperlipidemia   Tobacco abuse   Discharged Condition: good  Procedures: 04/12/15 PV angiogram by Dr. Gwenlyn Walton and successful PTA and stenting of the left external iliac artery with failed attempt at crossing the left SFA CTO because of heavy calcification.  Plans to return to discuss potential intervention on his right SFA.   Hospital Course: 64 year old married African-American male who by his wife today. He was referred by Dr. Melony Walton , from [facility] podiatry, for peripheral vascular evaluation. He has a history of treated hypertension and hyperlipidemia. He has seen Dr. Kellie Walton in the past for peripheral evaluation because of claudication but Dopplers were unrevealing. He's had a negative Myoview stress test performed 06/02/13. His cardiac risk factors include treated hypertension, hyponatremia and tobacco abuse smoking one pack per day. His brother died of a myocardial infarction at age 57. He has never had a heart attack or stroke and denies chest pain or shortness of breath. He does complain of right greater than left lower extremity claudication which is lifestyle limiting of less than 100 yards. He has an abnormal right fifth toe nail and was scheduled to have this evaluated by Dr. Melony Walton did not feel good peripheral pulses and referred him for further evaluation. Lower extremity arterial Doppler studies in our office confirmed that he had occluded SFAs bilaterally. Based on this, it was decided to proceed with angiography and potential  intervention for lifestyle limiting claudication. Pt underwent successful PTA and stenting of the left external iliac artery with failed attempt at crossing the left SFA CTO because of heavy calcification   Consults: None  Significant Diagnostic Studies:  BMP Latest Ref Rng 04/13/2015 04/10/2015  Glucose 65 - 99 mg/dL 107(H) 83  BUN 6 - 20 mg/dL 8 15  Creatinine 0.61 - 1.24 mg/dL 0.88 0.99  Sodium 135 - 145 mmol/L 139 140  Potassium 3.5 - 5.1 mmol/L 3.6 4.2  Chloride 101 - 111 mmol/L 107 102  CO2 22 - 32 mmol/L 26 27  Calcium 8.9 - 10.3 mg/dL 9.0 9.3   CBC Latest Ref Rng 04/13/2015 04/10/2015  WBC 4.0 - 10.5 K/uL 8.4 7.6  Hemoglobin 13.0 - 17.0 g/dL 13.4 14.5  Hematocrit 39.0 - 52.0 % 39.5 43.5  Platelets 150 - 400 K/uL 186 211    PV ANGIOGRAM: Angiographic Data:   1: Abdominal aorta-renal arteries were widely patent. The infrarenal about aorta was free of significant atherosclerotic changes.  2: Left lower extremity-80% focal left external iliac artery stenosis, occluded left SFA over the proximal and mid vessel. This was highly calcified fluoroscopically. The distal SFA filled by collaterals from the profunda femoris in the abductor canal. There was two-vessel runoff with occluded anterior tibial  3: Right lower extremity-there was a 50% right common and external iliac artery stenosis. Pullback gradient using a 5 [place] and VERY after intra-articular left) did not show a significant gradient. The right SFA was occluded over midportion was fluoroscopically calcified. The distal SFA in the adductor canal filled by profunda femoris collaterals. There is two-vessel runoff with occluded anterior tibial  IMPRESSION:Tyrone Walton  has a high-grade left external iliac artery which we will proceed with PTA and stenting followed by occluded SFAs bilaterally with heavy calcium fluoroscopically. We will attempt to cross the left SFA using a Viance CTO catheter and a successful perform directional  atherectomy, PTA plus or minus stenting.  He had successful PTA and stenting of the left external iliac artery with failed attempt at crossing the left SFA CTO because of heavy calcification.   Discharge Exam: Blood pressure 101/57, pulse 82, temperature 97.9 F (36.6 C), temperature source Oral, resp. rate 18, height 5\' 8"  (1.727 m), weight 160 lb 0.9 oz (72.6 kg), SpO2 97 %.  Disposition: 01-Home or Self Care     Medication List    ASK your doctor about these medications        atorvastatin 10 MG tablet  Commonly known as:  LIPITOR  Take 10 mg by mouth daily.     CHANTIX 0.5 MG tablet  Generic drug:  varenicline  Take 2 tablets by mouth 2 (two) times daily.     cholecalciferol 400 UNITS Tabs tablet  Commonly known as:  VITAMIN D  Take 400 Units by mouth daily.     FLEXERIL 5 MG tablet  Generic drug:  cyclobenzaprine  Take 5 mg by mouth as needed for muscle spasms.     Hydrocodone-Acetaminophen 5-300 MG Tabs  Take 5-300 mg by mouth 4 (four) times daily as needed. Take 1 tab as needed up to 4 times daily     magnesium oxide 400 MG tablet  Commonly known as:  MAG-OX  Take 400 mg by mouth daily.     naproxen 500 MG tablet  Commonly known as:  NAPROSYN  Take 1 tablet (500 mg total) by mouth 2 (two) times daily.     ONE-A-DAY MENS 50+ ADVANTAGE Tabs  Take 1 tablet by mouth daily.     tadalafil 20 MG tablet  Commonly known as:  CIALIS  Take 20 mg by mouth daily as needed for erectile dysfunction.     tamsulosin 0.4 MG Caps capsule  Commonly known as:  FLOMAX  Take 0.4 mg by mouth at bedtime.     valsartan-hydrochlorothiazide 320-25 MG per tablet  Commonly known as:  DIOVAN-HCT  Take 1 tablet by mouth daily.          Discharge Instructions: Call [place] at [phone] if any bleeding, swelling or drainage at cath site.  May shower, no tub baths for 48 hours for groin sticks. No lifting over 5 pounds for 3 days.  No Driving for 3 days.     Heart Healthy diet  Stop smoking  Follow up with Dr. Adora Walton to discuss another procedure.  We started you on Plavix and ASA to keep the stent open.  It is better not to take naprosyn in addition and you need the ASA and plavix, better to use tylenol for arthritis pain.     Signed: Isaiah Serge Nurse Practitioner-Certified [facility]: [facility] 04/13/2015, 8:24 AM  Time spent on discharge : > 30 minutes.

## 2015-04-13 NOTE — Discharge Instructions (Signed)
Call [place] at [phone] if any bleeding, swelling or drainage at cath site.  May shower, no tub baths for 48 hours for groin sticks. No lifting over 5 pounds for 3 days.  No Driving for 3 days.    Heart Healthy diet  Stop smoking  Follow up with Dr. Adora Fridge to discuss another procedure.  We started you on Plavix and ASA to keep the stent open.  It is better not to take naprosyn in addition and you need the ASA and plavix, better to use tylenol for arthritis pain.

## 2015-04-13 NOTE — Progress Notes (Signed)
64 year old married African-American Walton who by his wife today. He was referred by Dr. Melony Overly , from [facility] podiatry, for peripheral vascular evaluation. He has a history of treated hypertension and hyperlipidemia. He has seen Dr. Kellie Simmering in the past for peripheral evaluation because of claudication but Dopplers were unrevealing. He's had a negative Myoview stress test performed 06/02/13. His cardiac risk factors include treated hypertension, hyponatremia and tobacco abuse smoking one pack per day. His brother died of a myocardial infarction at age 14. He has never had a heart attack or stroke and denies chest pain or shortness of breath. He does complain of right greater than left lower extremity claudication which is lifestyle limiting of less than 100 yards. He has an abnormal right fifth toe nail and was scheduled to have this evaluated by Dr. Melony Overly did not feel good peripheral pulses and referred him for further evaluation. Lower extremity arterial Doppler studies in our office confirmed that he had occluded SFAs bilaterally. Based on this, it was decided to proceed with angiography and potential intervention for lifestyle limiting claudication.  Pt underwent successful PTA and stenting of the left external iliac artery with failed attempt at crossing the left SFA CTO because of heavy calcification    Subjective: No complaints  Objective: Vital signs in last 24 hours: Temp:  [97.5 F (36.4 C)-98 F (36.7 C)] 97.8 F (36.6 C) (07/01 0437) Pulse Rate:  [54-89] 81 (07/01 0437) Resp:  [9-24] 18 (07/01 0437) BP: (101-149)/(61-89) 105/62 mmHg (07/01 0437) SpO2:  [96 %-100 %] 98 % (07/01 0437) Weight:  [160 lb 0.9 oz (72.6 kg)] 160 lb 0.9 oz (72.6 kg) (07/01 0030) Weight change: 10 lb 0.9 oz (4.56 kg)   Intake/Output from previous day:  -558 06/30 0701 - 07/01 0700 In: 941.3 [P.O.:360; I.V.:581.3] Out: 1500 [Urine:1500] Intake/Output this shift:    PE: General:Pleasant affect,  NAD Skin:Warm and dry, brisk capillary refill HEENT:normocephalic, sclera clear, mucus membranes moist Heart:S1S2 RRR without murmur, gallup, rub or click Lungs:clear without rales, rhonchi, or wheezes VQQ:VZDG, non tender, + BS, do not palpate liver spleen or masses Ext:no lower ext edema, ?+ pedal pulses bil, 2+ radial pulses, rt groin cath site without hematoma.   Neuro:alert and oriented X 3, MAE, follows commands, + facial symmetry  tele:  SR with PACs  Lab Results:  Recent Labs  04/10/15 1209 04/13/15 0246  WBC 7.6 8.4  HGB 14.5 13.4  HCT 43.5 39.5  PLT 211 186   BMET  Recent Labs  04/10/15 1209 04/13/15 0246  NA 140 139  K 4.2 3.6  CL 102 107  CO2 27 26  GLUCOSE 83 107*  BUN 15 8  CREATININE 0.99 0.88  CALCIUM 9.3 9.0   No results for input(s): TROPONINI in the last 72 hours.  Invalid input(s): CK, MB  No results found for: CHOL, HDL, LDLCALC, LDLDIRECT, TRIG, CHOLHDL No results found for: HGBA1C   Lab Results  Component Value Date   TSH 0.929 04/10/2015      Studies/Results: Dg Chest 2 View  04/11/2015   CLINICAL DATA:  Pre operative respiratory exam.  EXAM: CHEST  2 VIEW  COMPARISON:  None.  FINDINGS: The heart size and mediastinal contours are within normal limits. Both lungs are clear. The visualized skeletal structures are unremarkable. Slight calcification in the thoracic aorta.  IMPRESSION: No significant abnormality.   Electronically Signed   By: Lorriane Shire M.D.   On: 04/11/2015 09:24    Medications:  I have reviewed the patient's current medications. Scheduled Meds: . aspirin EC  325 mg Oral Daily  . atorvastatin  10 mg Oral Daily  . clopidogrel  75 mg Oral Q breakfast  . tamsulosin  0.4 mg Oral QHS  . varenicline  1 mg Oral BID   Continuous Infusions:  PRN Meds:.acetaminophen, cyclobenzaprine, HYDROcodone-acetaminophen, morphine injection, ondansetron (ZOFRAN) IV  Assessment/Plan: Active Problems:   PAD (peripheral artery  disease)   Atherosclerosis of native arteries of extremity with intermittent claudication   Essential hypertension   Hyperlipidemia   Tobacco abuse   Claudication   S/P arterial stent-04/12/15 PTA/Stent of Lt ext iliac     He'll be discharged today and a follow-up visit will be arranged with Dr. Adora Fridge  in one to 2 weeks. At that time they will discuss the possibility of performing potential intervention on his right SFA.  Dopplers will be done after second procedure.    Time spent with pt. : 15 minutes. [facility]  Nurse Practitioner Certified Pager [phone] or after 5pm and on weekends call [phone] 04/13/2015, 7:22 AM

## 2015-04-19 ENCOUNTER — Encounter: Payer: Self-pay | Admitting: *Deleted

## 2015-04-24 ENCOUNTER — Ambulatory Visit: Payer: BC Managed Care – PPO | Admitting: Cardiovascular Disease

## 2015-04-27 ENCOUNTER — Encounter: Payer: Self-pay | Admitting: Cardiovascular Disease

## 2015-04-27 ENCOUNTER — Ambulatory Visit (INDEPENDENT_AMBULATORY_CARE_PROVIDER_SITE_OTHER): Payer: BC Managed Care – PPO | Admitting: Cardiovascular Disease

## 2015-04-27 VITALS — BP 118/72 | HR 84 | Ht 68.0 in | Wt 161.3 lb

## 2015-04-27 DIAGNOSIS — I739 Peripheral vascular disease, unspecified: Secondary | ICD-10-CM

## 2015-04-27 DIAGNOSIS — D689 Coagulation defect, unspecified: Secondary | ICD-10-CM | POA: Diagnosis not present

## 2015-04-27 DIAGNOSIS — Z9889 Other specified postprocedural states: Secondary | ICD-10-CM

## 2015-04-27 DIAGNOSIS — Z01818 Encounter for other preprocedural examination: Secondary | ICD-10-CM

## 2015-04-27 DIAGNOSIS — Z959 Presence of cardiac and vascular implant and graft, unspecified: Secondary | ICD-10-CM

## 2015-04-27 DIAGNOSIS — R5381 Other malaise: Secondary | ICD-10-CM | POA: Diagnosis not present

## 2015-04-27 DIAGNOSIS — R5383 Other fatigue: Secondary | ICD-10-CM

## 2015-04-27 NOTE — Assessment & Plan Note (Signed)
History of lifestyle limiting claudication with Dopplers performed 04/06/15 revealing ABIs in the 0.5-0.6 range bilaterally with occluded SFAs. I angiogram to him 04/12/15 revealing a high-grade left external iliac artery stenosis which I stented. He had a long segmental calcified occluded left SFA which I was unable to cross. He does have a segmented segmentally occluded right SFA is calcified as well. Since his procedure his left lower external claudication has improved. He has continued right lower extremity claudication which is less than style limiting. I'm going to arrange for him to undergo potential intervention in 2 weeks

## 2015-04-27 NOTE — Patient Instructions (Addendum)
Dr. Gwenlyn Found has ordered a peripheral angiogram to be done at [facility] on Thursday, July 28th.  This procedure is going to look at the bloodflow in your lower extremities.  If Dr. Gwenlyn Found is able to open up the arteries, you will have to spend one night in the hospital.  If he is not able to open the arteries, you will be able to go home that same day.    After the procedure, you will not be allowed to drive for 3 days or push, pull, or lift anything greater than 10 lbs for one week.    You will be required to have the following tests prior to the procedure:  1. Blood work-the blood work can be done no more than 7 days prior to the procedure.  It can be done at any [facility] lab.  There is one downstairs on the first floor of this building and one on [place] ([address]. phone - [phone]   *REPS scott   right groin access

## 2015-04-27 NOTE — Progress Notes (Signed)
04/27/2015 Merryl Hacker   [birth year]  [id number]  Primary Physician Cletis Athens ANN, NP Primary Cardiologist: Lorretta Harp MD Renae Gloss   HPI:  Mr. Hinze is a 64 year old married African-American male who by his wife today. He was referred by Dr. Melony Overly , from [facility] podiatry, for peripheral vascular evaluation. He has a history of treated hypertension and hyperlipidemia. He has seen Dr. Kellie Simmering in the past for peripheral evaluation because of claudication but Dopplers were unrevealing. He's had a negative Myoview stress test performed 06/02/13. His cardiac risk factors include treated hypertension, hyponatremia and tobacco abuse smoking one pack per day. His brother died of a myocardial infarction at age 74. He has never had a heart attack or stroke and denies chest pain or shortness of breath. He does complain of right greater than left lower extremity claudication which is lifestyle limiting of less than 100 yards. He has an abnormal right fifth toe nail and was scheduled to have this evaluated by Dr. Melony Overly did not feel good peripheral pulses and referred him for further evaluation. Lower extremity arterial Doppler studies in our office confirmed that he had occluded SFAs bilaterally. Based on this, it was decided to proceed with angiography and potential intervention for lifestyle limiting claudication. This occurred on 04/12/15. I did end up spending his left external iliac artery reducing an 80% stenosis to 0% residual. He long segmentally occluded calcified left SFA which I was unable to cross. Since discharge his left lower external claudication has significantly improved. He has residual right lower extremity discomfort with ambulation and has a segmentally occluded and calcified right SFA. We will plan on staging his right SFA intervention for several weeks from now.   Current Outpatient Prescriptions  Medication Sig Dispense Refill  . acetaminophen (TYLENOL) 325  MG tablet Take 2 tablets (650 mg total) by mouth every 4 (four) hours as needed for headache or mild pain.    Marland Kitchen aspirin EC 325 MG EC tablet Take 1 tablet (325 mg total) by mouth daily. 30 tablet 0  . atorvastatin (LIPITOR) 10 MG tablet Take 10 mg by mouth daily.    . CHANTIX 0.5 MG tablet Take 2 tablets by mouth 2 (two) times daily.  0  . cholecalciferol (VITAMIN D) 400 UNITS TABS tablet Take 400 Units by mouth daily.     . clopidogrel (PLAVIX) 75 MG tablet Take 1 tablet (75 mg total) by mouth daily with breakfast. 30 tablet 6  . cyclobenzaprine (FLEXERIL) 5 MG tablet Take 5 mg by mouth as needed for muscle spasms.    . Hydrocodone-Acetaminophen 5-300 MG TABS Take 5-300 mg by mouth 4 (four) times daily as needed. Take 1 tab as needed up to 4 times daily  0  . magnesium oxide (MAG-OX) 400 MG tablet Take 400 mg by mouth daily.    . Multiple Vitamins-Minerals (ONE-A-DAY MENS 50+ ADVANTAGE) TABS Take 1 tablet by mouth daily.    . tadalafil (CIALIS) 20 MG tablet Take 20 mg by mouth daily as needed for erectile dysfunction.    . tamsulosin (FLOMAX) 0.4 MG CAPS capsule Take 0.4 mg by mouth at bedtime.  0  . valsartan-hydrochlorothiazide (DIOVAN-HCT) 320-25 MG per tablet Take 1 tablet by mouth daily.     No current facility-administered medications for this visit.    No Known Allergies  History   Social History  . Marital Status: Married    Spouse Name: N/A  . Number of Children: N/A  . Years  of Education: N/A   Occupational History  . Not on file.   Social History Main Topics  . Smoking status: Current Every Day Smoker -- 0.50 packs/day for 49 years    Types: Cigarettes  . Smokeless tobacco: Never Used  . Alcohol Use: 7.2 oz/week    12 Cans of beer per week     Comment: 04/12/2015 "I only drink on the weekends"  . Drug Use: No  . Sexual Activity: Yes   Other Topics Concern  . Not on file   Social History Narrative     Review of Systems: General: negative for chills, fever,  night sweats or weight changes.  Cardiovascular: negative for chest pain, dyspnea on exertion, edema, orthopnea, palpitations, paroxysmal nocturnal dyspnea or shortness of breath Dermatological: negative for rash Respiratory: negative for cough or wheezing Urologic: negative for hematuria Abdominal: negative for nausea, vomiting, diarrhea, bright red blood per rectum, melena, or hematemesis Neurologic: negative for visual changes, syncope, or dizziness All other systems reviewed and are otherwise negative except as noted above.    Blood pressure 118/72, pulse 84, height 5\' 8"  (1.727 m), weight 161 lb 4.8 oz (73.165 kg).  General appearance: alert and no distress Neck: no adenopathy, no carotid bruit, no JVD, supple, symmetrical, trachea midline and thyroid not enlarged, symmetric, no tenderness/mass/nodules Lungs: clear to auscultation bilaterally Heart: regular rate and rhythm, S1, S2 normal, no murmur, click, rub or gallop Extremities: extremities normal, atraumatic, no cyanosis or edema  EKG not performed today  ASSESSMENT AND PLAN:   S/P arterial stent-04/12/15 PTA/Stent of Lt ext iliac History of lifestyle limiting claudication with Dopplers performed 04/06/15 revealing ABIs in the 0.5-0.6 range bilaterally with occluded SFAs. I angiogram to him 04/12/15 revealing a high-grade left external iliac artery stenosis which I stented. He had a long segmental calcified occluded left SFA which I was unable to cross. He does have a segmented segmentally occluded right SFA is calcified as well. Since his procedure his left lower external claudication has improved. He has continued right lower extremity claudication which is less than style limiting. I'm going to arrange for him to undergo potential intervention in 2 weeks      Lorretta Harp MD [facility], [facility] 04/27/2015 10:46 AM

## 2015-05-04 ENCOUNTER — Telehealth: Payer: Self-pay | Admitting: Cardiovascular Disease

## 2015-05-04 NOTE — Telephone Encounter (Signed)
Please call,pt is on aspirin and Plavix, he \\woke  up with 2 bruises on his arm. He also have been getting dizzy.He scheduled for another procedure on his leg,Thursday(05-10-15).She wants to know what to do about his blood thinner.

## 2015-05-04 NOTE — Telephone Encounter (Signed)
Returned call to patient's wife she wanted to let Dr.Berry know husband has 1 bruise on each arm appox size of half dollar.Advised patient will bruise more easily on plavix and aspirin.Wife reassured.Advised to continue as prescribed.Stated he was having dizziness after taking plavix, but he started eating before taking and dizziness better.Advised to keep appointment for PV angiogram as planned.Message sent to [facility] for review.

## 2015-05-09 LAB — PROTIME-INR
INR: 0.98 (ref <1.50)
PROTHROMBIN TIME: 13 s (ref 11.6–15.2)

## 2015-05-09 LAB — APTT: aPTT: 29 seconds (ref 24–37)

## 2015-05-10 ENCOUNTER — Ambulatory Visit (HOSPITAL_COMMUNITY)
Admission: RE | Admit: 2015-05-10 | Discharge: 2015-05-11 | Disposition: A | Discharge status: Home / Self Care | Payer: BC Managed Care – PPO | Source: Ambulatory Visit | Attending: Cardiovascular Disease | Admitting: Cardiovascular Disease

## 2015-05-10 ENCOUNTER — Encounter (HOSPITAL_COMMUNITY): Payer: Self-pay | Admitting: Cardiovascular Disease

## 2015-05-10 ENCOUNTER — Encounter (HOSPITAL_COMMUNITY)
Admission: RE | Disposition: A | Discharge status: Home / Self Care | Payer: BC Managed Care – PPO | Source: Ambulatory Visit | Attending: Cardiovascular Disease

## 2015-05-10 DIAGNOSIS — I10 Essential (primary) hypertension: Secondary | ICD-10-CM | POA: Diagnosis not present

## 2015-05-10 DIAGNOSIS — I70211 Atherosclerosis of native arteries of extremities with intermittent claudication, right leg: Secondary | ICD-10-CM | POA: Diagnosis not present

## 2015-05-10 DIAGNOSIS — I708 Atherosclerosis of other arteries: Secondary | ICD-10-CM | POA: Insufficient documentation

## 2015-05-10 DIAGNOSIS — Z79891 Long term (current) use of opiate analgesic: Secondary | ICD-10-CM | POA: Insufficient documentation

## 2015-05-10 DIAGNOSIS — Z7902 Long term (current) use of antithrombotics/antiplatelets: Secondary | ICD-10-CM | POA: Diagnosis not present

## 2015-05-10 DIAGNOSIS — Z720 Tobacco use: Secondary | ICD-10-CM | POA: Diagnosis present

## 2015-05-10 DIAGNOSIS — Z79899 Other long term (current) drug therapy: Secondary | ICD-10-CM | POA: Insufficient documentation

## 2015-05-10 DIAGNOSIS — Z7982 Long term (current) use of aspirin: Secondary | ICD-10-CM | POA: Insufficient documentation

## 2015-05-10 DIAGNOSIS — F17210 Nicotine dependence, cigarettes, uncomplicated: Secondary | ICD-10-CM | POA: Diagnosis not present

## 2015-05-10 DIAGNOSIS — Z959 Presence of cardiac and vascular implant and graft, unspecified: Secondary | ICD-10-CM

## 2015-05-10 DIAGNOSIS — I739 Peripheral vascular disease, unspecified: Secondary | ICD-10-CM | POA: Diagnosis present

## 2015-05-10 DIAGNOSIS — E785 Hyperlipidemia, unspecified: Secondary | ICD-10-CM | POA: Diagnosis not present

## 2015-05-10 DIAGNOSIS — R0602 Shortness of breath: Secondary | ICD-10-CM | POA: Diagnosis not present

## 2015-05-10 DIAGNOSIS — I70212 Atherosclerosis of native arteries of extremities with intermittent claudication, left leg: Secondary | ICD-10-CM | POA: Insufficient documentation

## 2015-05-10 HISTORY — PX: PERIPHERAL VASCULAR CATHETERIZATION: SHX172C

## 2015-05-10 LAB — CBC
HEMATOCRIT: 38.1 % — AB (ref 39.0–52.0)
Hemoglobin: 12.5 g/dL — ABNORMAL LOW (ref 13.0–17.0)
MCH: 29.8 pg (ref 26.0–34.0)
MCHC: 32.8 g/dL (ref 30.0–36.0)
MCV: 90.9 fL (ref 78.0–100.0)
MPV: 8.6 fL (ref 8.6–12.4)
PLATELETS: 208 10*3/uL (ref 150–400)
RBC: 4.19 MIL/uL — ABNORMAL LOW (ref 4.22–5.81)
RDW: 15.1 % (ref 11.5–15.5)
WBC: 6.5 10*3/uL (ref 4.0–10.5)

## 2015-05-10 LAB — BASIC METABOLIC PANEL
BUN: 15 mg/dL (ref 7–25)
CHLORIDE: 103 meq/L (ref 98–110)
CO2: 26 mEq/L (ref 20–31)
CREATININE: 1.03 mg/dL (ref 0.70–1.25)
Calcium: 9.3 mg/dL (ref 8.6–10.3)
GLUCOSE: 97 mg/dL (ref 65–99)
Potassium: 4 mEq/L (ref 3.5–5.3)
Sodium: 141 mEq/L (ref 135–146)

## 2015-05-10 LAB — POCT ACTIVATED CLOTTING TIME
ACTIVATED CLOTTING TIME: 196 s
ACTIVATED CLOTTING TIME: 177 s
Activated Clotting Time: 226 seconds

## 2015-05-10 SURGERY — PERIPHERAL VASCULAR INTERVENTION
Laterality: Right

## 2015-05-10 MED ORDER — SODIUM CHLORIDE 0.9 % IV SOLN: 250.0000 mL | INTRAVENOUS | Status: DC | PRN

## 2015-05-10 MED ORDER — CHOLECALCIFEROL 10 MCG (400 UNIT) PO TABS
400.0000 [IU] | ORAL_TABLET | Freq: Every day | ORAL | Status: DC
  Administered 2015-05-11: 09:00:00 400 [IU] via ORAL
  Filled 2015-05-10: qty 1

## 2015-05-10 MED ORDER — CLOPIDOGREL BISULFATE 75 MG PO TABS: 75.0000 mg | ORAL_TABLET | Freq: Every day | ORAL | Status: DC

## 2015-05-10 MED ORDER — HEPARIN SODIUM (PORCINE) 1000 UNIT/ML IJ SOLN
INTRAMUSCULAR | Status: AC
Start: 2015-05-10 — End: 2015-05-10
  Filled 2015-05-10: qty 1

## 2015-05-10 MED ORDER — SODIUM CHLORIDE 0.9 % IJ SOLN: 3.0000 mL | Freq: Two times a day (BID) | INTRAMUSCULAR | Status: DC

## 2015-05-10 MED ORDER — CLOPIDOGREL BISULFATE 75 MG PO TABS
75.0000 mg | ORAL_TABLET | Freq: Every day | ORAL | Status: DC
  Administered 2015-05-11: 75 mg via ORAL
  Filled 2015-05-10 (×2): qty 1

## 2015-05-10 MED ORDER — MORPHINE SULFATE 2 MG/ML IJ SOLN: 2.0000 mg | INTRAMUSCULAR | Status: DC | PRN

## 2015-05-10 MED ORDER — ASPIRIN 81 MG PO CHEW
81.0000 mg | CHEWABLE_TABLET | Freq: Every day | ORAL | Status: DC
  Administered 2015-05-11: 09:00:00 81 mg via ORAL
  Filled 2015-05-10: qty 1

## 2015-05-10 MED ORDER — ONDANSETRON HCL 4 MG/2ML IJ SOLN: 4.0000 mg | Freq: Four times a day (QID) | INTRAMUSCULAR | Status: DC | PRN

## 2015-05-10 MED ORDER — ACETAMINOPHEN 325 MG PO TABS: 650.0000 mg | ORAL_TABLET | ORAL | Status: DC | PRN

## 2015-05-10 MED ORDER — ASPIRIN 81 MG PO CHEW
CHEWABLE_TABLET | ORAL | Status: AC
  Administered 2015-05-10: 81 mg via ORAL
  Filled 2015-05-10: qty 1

## 2015-05-10 MED ORDER — SODIUM CHLORIDE 0.9 % IJ SOLN: 3.0000 mL | INTRAMUSCULAR | Status: DC | PRN

## 2015-05-10 MED ORDER — HEPARIN SODIUM (PORCINE) 1000 UNIT/ML IJ SOLN
INTRAMUSCULAR | Status: DC | PRN
  Administered 2015-05-10: 5000 [IU] via INTRAVENOUS
  Administered 2015-05-10: 3000 [IU] via INTRAVENOUS

## 2015-05-10 MED ORDER — CYCLOBENZAPRINE HCL 10 MG PO TABS: 5.0000 mg | ORAL_TABLET | ORAL | Status: DC | PRN

## 2015-05-10 MED ORDER — VARENICLINE TARTRATE 0.5 MG PO TABS
0.5000 mg | ORAL_TABLET | Freq: Two times a day (BID) | ORAL | Status: DC
  Administered 2015-05-10 – 2015-05-11 (×2): 0.5 mg via ORAL
  Filled 2015-05-10 (×3): qty 1

## 2015-05-10 MED ORDER — SODIUM CHLORIDE 0.9 % WEIGHT BASED INFUSION
3.0000 mL/kg/h | INTRAVENOUS | Status: DC
  Administered 2015-05-10: 3 mL/kg/h via INTRAVENOUS

## 2015-05-10 MED ORDER — ASPIRIN 81 MG PO CHEW
81.0000 mg | CHEWABLE_TABLET | ORAL | Status: AC
  Administered 2015-05-10: 81 mg via ORAL

## 2015-05-10 MED ORDER — VALSARTAN-HYDROCHLOROTHIAZIDE 320-25 MG PO TABS
1.0000 | ORAL_TABLET | Freq: Every day | ORAL | Status: DC
  Administered 2015-05-11: 10:00:00 1 via ORAL
  Filled 2015-05-10: qty 1

## 2015-05-10 MED ORDER — HEPARIN (PORCINE) IN NACL 2-0.9 UNIT/ML-% IJ SOLN
INTRAMUSCULAR | Status: AC
  Filled 2015-05-10: qty 1000

## 2015-05-10 MED ORDER — SODIUM CHLORIDE 0.9 % WEIGHT BASED INFUSION: 3.0000 mL/kg/h | INTRAVENOUS | Status: AC

## 2015-05-10 MED ORDER — FENTANYL CITRATE (PF) 100 MCG/2ML IJ SOLN
INTRAMUSCULAR | Status: DC | PRN
  Administered 2015-05-10: 25 ug via INTRAVENOUS

## 2015-05-10 MED ORDER — ATORVASTATIN CALCIUM 10 MG PO TABS
10.0000 mg | ORAL_TABLET | Freq: Every day | ORAL | Status: DC
  Administered 2015-05-11: 10 mg via ORAL
  Filled 2015-05-10: qty 1

## 2015-05-10 MED ORDER — MIDAZOLAM HCL 2 MG/2ML IJ SOLN
INTRAMUSCULAR | Status: AC
  Filled 2015-05-10: qty 2

## 2015-05-10 MED ORDER — LIDOCAINE HCL (PF) 1 % IJ SOLN
INTRAMUSCULAR | Status: DC | PRN
  Administered 2015-05-10: 30 mL

## 2015-05-10 MED ORDER — TAMSULOSIN HCL 0.4 MG PO CAPS
0.4000 mg | ORAL_CAPSULE | Freq: Every day | ORAL | Status: DC
  Administered 2015-05-10: 22:00:00 0.4 mg via ORAL
  Filled 2015-05-10: qty 1

## 2015-05-10 MED ORDER — ASPIRIN EC 325 MG PO TBEC: 325.0000 mg | DELAYED_RELEASE_TABLET | Freq: Every day | ORAL | Status: DC

## 2015-05-10 MED ORDER — LIDOCAINE HCL (PF) 1 % IJ SOLN
INTRAMUSCULAR | Status: AC
  Filled 2015-05-10: qty 30

## 2015-05-10 MED ORDER — MIDAZOLAM HCL 2 MG/2ML IJ SOLN
INTRAMUSCULAR | Status: DC | PRN
  Administered 2015-05-10: 1 mg via INTRAVENOUS

## 2015-05-10 MED ORDER — SODIUM CHLORIDE 0.9 % WEIGHT BASED INFUSION: 1.0000 mL/kg/h | INTRAVENOUS | Status: DC

## 2015-05-10 MED ORDER — FENTANYL CITRATE (PF) 100 MCG/2ML IJ SOLN
INTRAMUSCULAR | Status: AC
  Filled 2015-05-10: qty 2

## 2015-05-10 SURGICAL SUPPLY — 19 items
BALLN POWERFLX PRO 6X40X80 (BALLOONS) ×3 IMPLANT
BALLOON POWERFLX PRO 4X40X80 (BALLOONS) ×4 IMPLANT
CATH CROSS OVER TEMPO 5F (CATHETERS) ×3 IMPLANT
CATH STRAIGHT 5FR 65CM (CATHETERS) ×3 IMPLANT
GUIDEWIRE ANGLED .035X150CM (WIRE) ×4 IMPLANT
KIT ENCORE 26 ADVANTAGE (KITS) ×3 IMPLANT
KIT PV (KITS) ×4 IMPLANT
SHEATH HIGHFLEX ANSEL 7FR 55CM (SHEATH) ×3 IMPLANT
SHEATH PINNACLE 5F 10CM (SHEATH) ×4 IMPLANT
SHEATH PINNACLE 7F 10CM (SHEATH) ×3 IMPLANT
STENT SMART CONTROL 8X40X120 (Permanent Stent) ×3 IMPLANT
STENT SMART CONTROL 8X80X120 (Permanent Stent) ×4 IMPLANT
SYR MEDRAD MARK V 150ML (SYRINGE) ×4 IMPLANT
TAPE RADIOPAQUE TURBO (MISCELLANEOUS) ×4 IMPLANT
TRANSDUCER W/STOPCOCK (MISCELLANEOUS) ×4 IMPLANT
TRAY PV CATH (CUSTOM PROCEDURE TRAY) ×4 IMPLANT
WIRE HITORQ VERSACORE ST 145CM (WIRE) ×3 IMPLANT
WIRE ROSEN-J .035X180CM (WIRE) ×3 IMPLANT
WIRE VERSACORE LOC 115CM (WIRE) ×8 IMPLANT

## 2015-05-10 NOTE — H&P (View-Only) (Signed)
04/27/2015 Tyrone Walton   02-04-1951  [id number]  Primary Physician Tyrone Walton ANN, NP Primary Cardiologist: Tyrone Harp MD Tyrone Walton   HPI:  [name] is a 64 year old married African-American male who by his wife today. He was referred by Dr. Melony Walton , from [facility] podiatry, for peripheral vascular evaluation. He has a history of treated hypertension and hyperlipidemia. He has seen Dr. Kellie Walton in the past for peripheral evaluation because of claudication but Dopplers were unrevealing. He's had a negative Myoview stress test performed 06/02/13. His cardiac risk factors include treated hypertension, hyponatremia and tobacco abuse smoking one pack per day. His brother died of a myocardial infarction at age 62. He has never had a heart attack or stroke and denies chest pain or shortness of breath. He does complain of right greater than left lower extremity claudication which is lifestyle limiting of less than 100 yards. He has an abnormal right fifth toe nail and was scheduled to have this evaluated by Dr. Melony Walton did not feel good peripheral pulses and referred him for further evaluation. Lower extremity arterial Doppler studies in our office confirmed that he had occluded SFAs bilaterally. Based on this, it was decided to proceed with angiography and potential intervention for lifestyle limiting claudication. This occurred on 04/12/15. I did end up spending his left external iliac artery reducing an 80% stenosis to 0% residual. He long segmentally occluded calcified left SFA which I was unable to cross. Since discharge his left lower external claudication has significantly improved. He has residual right lower extremity discomfort with ambulation and has a segmentally occluded and calcified right SFA. We will plan on staging his right SFA intervention for several weeks from now.   Current Outpatient Prescriptions  Medication Sig Dispense Refill  . acetaminophen (TYLENOL) 325  MG tablet Take 2 tablets (650 mg total) by mouth every 4 (four) hours as needed for headache or mild pain.    Marland Kitchen aspirin EC 325 MG EC tablet Take 1 tablet (325 mg total) by mouth daily. 30 tablet 0  . atorvastatin (LIPITOR) 10 MG tablet Take 10 mg by mouth daily.    . CHANTIX 0.5 MG tablet Take 2 tablets by mouth 2 (two) times daily.  0  . cholecalciferol (VITAMIN D) 400 UNITS TABS tablet Take 400 Units by mouth daily.     . clopidogrel (PLAVIX) 75 MG tablet Take 1 tablet (75 mg total) by mouth daily with breakfast. 30 tablet 6  . cyclobenzaprine (FLEXERIL) 5 MG tablet Take 5 mg by mouth as needed for muscle spasms.    . Hydrocodone-Acetaminophen 5-300 MG TABS Take 5-300 mg by mouth 4 (four) times daily as needed. Take 1 tab as needed up to 4 times daily  0  . magnesium oxide (MAG-OX) 400 MG tablet Take 400 mg by mouth daily.    . Multiple Vitamins-Minerals (ONE-A-DAY MENS 50+ ADVANTAGE) TABS Take 1 tablet by mouth daily.    . tadalafil (CIALIS) 20 MG tablet Take 20 mg by mouth daily as needed for erectile dysfunction.    . tamsulosin (FLOMAX) 0.4 MG CAPS capsule Take 0.4 mg by mouth at bedtime.  0  . valsartan-hydrochlorothiazide (DIOVAN-HCT) 320-25 MG per tablet Take 1 tablet by mouth daily.     No current facility-administered medications for this visit.    No Known Allergies  History   Social History  . Marital Status: Married    Spouse Name: N/A  . Number of Children: N/A  . Years  of Education: N/A   Occupational History  . Not on file.   Social History Main Topics  . Smoking status: Current Every Day Smoker -- 0.50 packs/day for 49 years    Types: Cigarettes  . Smokeless tobacco: Never Used  . Alcohol Use: 7.2 oz/week    12 Cans of beer per week     Comment: 04/12/2015 "I only drink on the weekends"  . Drug Use: No  . Sexual Activity: Yes   Other Topics Concern  . Not on file   Social History Narrative     Review of Systems: General: negative for chills, fever,  night sweats or weight changes.  Cardiovascular: negative for chest pain, dyspnea on exertion, edema, orthopnea, palpitations, paroxysmal nocturnal dyspnea or shortness of breath Dermatological: negative for rash Respiratory: negative for cough or wheezing Urologic: negative for hematuria Abdominal: negative for nausea, vomiting, diarrhea, bright red blood per rectum, melena, or hematemesis Neurologic: negative for visual changes, syncope, or dizziness All other systems reviewed and are otherwise negative except as noted above.    Blood pressure 118/72, pulse 84, height 5\' 8"  (1.727 m), weight 161 lb 4.8 oz (73.165 kg).  General appearance: alert and no distress Neck: no adenopathy, no carotid bruit, no JVD, supple, symmetrical, trachea midline and thyroid not enlarged, symmetric, no tenderness/mass/nodules Lungs: clear to auscultation bilaterally Heart: regular rate and rhythm, S1, S2 normal, no murmur, click, rub or gallop Extremities: extremities normal, atraumatic, no cyanosis or edema  EKG not performed today  ASSESSMENT AND PLAN:   S/P arterial stent-04/12/15 PTA/Stent of Lt ext iliac History of lifestyle limiting claudication with Dopplers performed 04/06/15 revealing ABIs in the 0.5-0.6 range bilaterally with occluded SFAs. I angiogram to him 04/12/15 revealing a high-grade left external iliac artery stenosis which I stented. He had a long segmental calcified occluded left SFA which I was unable to cross. He does have a segmented segmentally occluded right SFA is calcified as well. Since his procedure his left lower external claudication has improved. He has continued right lower extremity claudication which is less than style limiting. I'm going to arrange for him to undergo potential intervention in 2 weeks      Tyrone Harp MD [facility], [facility] 04/27/2015 10:46 AM

## 2015-05-10 NOTE — Progress Notes (Signed)
Eating [place] sandwich. Wife visiting. Waiting on 6500 bed.

## 2015-05-10 NOTE — Progress Notes (Signed)
Site area: lt groin Site Prior to Removal:  Level 0 Pressure Applied For:  30 minutes Manual:   yes Patient Status During Pull:  stable Post Pull Site:  Level  0 Post Pull Instructions Given:  yes Post Pull Pulses Present: yes Dressing Applied:  tegaderm Bedrest begins @  1010 Comments:  0

## 2015-05-10 NOTE — Interval H&P Note (Signed)
History and Physical Interval Note:  05/10/2015 7:30 AM  Merryl Hacker  has presented today for surgery, with the diagnosis of pvd   The various methods of treatment have been discussed with the patient and family. After consideration of risks, benefits and other options for treatment, the patient has consented to  Procedure(s): Lower Extremity Angiography (N/A) as a surgical intervention .  The patient's history has been reviewed, patient examined, no change in status, stable for surgery.  I have reviewed the patient's chart and labs.  Questions were answered to the patient's satisfaction.     Quay Burow

## 2015-05-10 NOTE — Research (Signed)
SAFE-DCB research study reviewed with patient, questions encouraged and answered. Patient declined to participate.

## 2015-05-11 ENCOUNTER — Other Ambulatory Visit: Payer: Self-pay | Admitting: Physician Assistant

## 2015-05-11 DIAGNOSIS — I708 Atherosclerosis of other arteries: Secondary | ICD-10-CM | POA: Diagnosis not present

## 2015-05-11 DIAGNOSIS — E785 Hyperlipidemia, unspecified: Secondary | ICD-10-CM | POA: Diagnosis not present

## 2015-05-11 DIAGNOSIS — Z9889 Other specified postprocedural states: Secondary | ICD-10-CM

## 2015-05-11 DIAGNOSIS — I10 Essential (primary) hypertension: Secondary | ICD-10-CM | POA: Diagnosis not present

## 2015-05-11 DIAGNOSIS — I739 Peripheral vascular disease, unspecified: Secondary | ICD-10-CM | POA: Diagnosis not present

## 2015-05-11 DIAGNOSIS — I70212 Atherosclerosis of native arteries of extremities with intermittent claudication, left leg: Secondary | ICD-10-CM | POA: Diagnosis not present

## 2015-05-11 LAB — BASIC METABOLIC PANEL
Anion gap: 6 (ref 5–15)
BUN: 9 mg/dL (ref 6–20)
CALCIUM: 8.8 mg/dL — AB (ref 8.9–10.3)
CHLORIDE: 107 mmol/L (ref 101–111)
CO2: 25 mmol/L (ref 22–32)
CREATININE: 0.84 mg/dL (ref 0.61–1.24)
GFR calc Af Amer: >60 mL/min (ref >60)
Glucose, Bld: 96 mg/dL (ref 65–99)
Potassium: 3.8 mmol/L (ref 3.5–5.1)
SODIUM: 138 mmol/L (ref 135–145)

## 2015-05-11 LAB — CBC
HCT: 34.8 % — ABNORMAL LOW (ref 39.0–52.0)
Hemoglobin: 11.6 g/dL — ABNORMAL LOW (ref 13.0–17.0)
MCH: 29.8 pg (ref 26.0–34.0)
MCHC: 33.3 g/dL (ref 30.0–36.0)
MCV: 89.5 fL (ref 78.0–100.0)
Platelets: 191 10*3/uL (ref 150–400)
RBC: 3.89 MIL/uL — AB (ref 4.22–5.81)
RDW: 15.3 % (ref 11.5–15.5)
WBC: 8.7 10*3/uL (ref 4.0–10.5)

## 2015-05-11 MED ORDER — ASPIRIN 81 MG PO CHEW: 81.0000 mg | CHEWABLE_TABLET | Freq: Every day | ORAL | Status: AC

## 2015-05-11 MED FILL — Heparin Sodium (Porcine) 2 Unit/ML in Sodium Chloride 0.9%: INTRAMUSCULAR | Qty: 1000 | Status: AC

## 2015-05-11 NOTE — Progress Notes (Signed)
Patient Name: Tyrone Walton Date of Encounter: 05/11/2015  Primary Cardiologist: Dr. Gwenlyn Found   Active Problems:   Claudication   S/P arterial stent-04/12/15 PTA/Stent of Lt ext iliac    SUBJECTIVE  Denies significant CP, mild SOB.   CURRENT MEDS . aspirin  81 mg Oral Daily  . atorvastatin  10 mg Oral Daily  . cholecalciferol  400 Units Oral Daily  . clopidogrel  75 mg Oral Q breakfast  . sodium chloride  3 mL Intravenous Q12H  . tamsulosin  0.4 mg Oral QHS  . valsartan-hydrochlorothiazide  1 tablet Oral Daily  . varenicline  0.5 mg Oral BID    OBJECTIVE  Filed Vitals:   05/10/15 1601 05/10/15 2036 05/11/15 0023 05/11/15 0432  BP: 110/66 104/61 86/57 98/48   Pulse: 83 73 68 71  Temp:  97.8 F (36.6 C) 97.8 F (36.6 C) 98.1 F (36.7 C)  TempSrc:  Oral Oral Oral  Resp: 18 17 16 20   Height:      Weight:   162 lb 11.2 oz (73.8 kg)   SpO2: 100% 98% 98% 96%    Intake/Output Summary (Last 24 hours) at 05/11/15 0701 Last data filed at 05/11/15 0634  Gross per 24 hour  Intake   1476 ml  Output   2200 ml  Net   -724 ml   Filed Weights   05/10/15 0551 05/11/15 0023  Weight: 161 lb (73.029 kg) 162 lb 11.2 oz (73.8 kg)    PHYSICAL EXAM  General: Pleasant, NAD. Neuro: Alert and oriented X 3. Moves all extremities spontaneously. Psych: Normal affect. HEENT:  Normal  Neck: Supple without bruits or JVD. Lungs:  Resp regular and unlabored. Decreased breath sound in L base, intermittent rale in R base. Heart: RRR no s3, s4, or murmurs. L groin cath site stable without bleeding or hematoma Abdomen: Soft, non-tender, non-distended, BS + x 4.  Extremities: No clubbing, cyanosis or edema. DP/PT/Radials 2+ and equal bilaterally.  Accessory Clinical Findings  CBC  Recent Labs  05/09/15 0836 05/11/15 0305  WBC 6.5 8.7  HGB 12.5* 11.6*  HCT 38.1* 34.8*  MCV 90.9 89.5  PLT 208 993   Basic Metabolic Panel  Recent Labs  05/09/15 0836 05/11/15 0305  NA 141 138    K 4.0 3.8  CL 103 107  CO2 26 25  GLUCOSE 97 96  BUN 15 9  CREATININE 1.03 0.84  CALCIUM 9.3 8.8*    TELE NSR    ECG  NSR without significant ST-T wave changes   Radiology/Studies  Dg Chest 2 View  04/11/2015   CLINICAL DATA:  Pre operative respiratory exam.  EXAM: CHEST  2 VIEW  COMPARISON:  None.  FINDINGS: The heart size and mediastinal contours are within normal limits. Both lungs are clear. The visualized skeletal structures are unremarkable. Slight calcification in the thoracic aorta.  IMPRESSION: No significant abnormality.   Electronically Signed   By: Lorriane Shire M.D.   On: 04/11/2015 09:24    ASSESSMENT AND PLAN  64 yo AA male with PMH of HTN and HLD referred to Dr. Gwenlyn Found for evaluation of PAD. Office LE arterial doppler revealed occluded SFAs bilaterally. Proceeded with outpatient LE angiography on 04/12/2015 stenting to L external iliac artery with long segmentally occluded calcified L SFA which unable to cross. Underwent staged R LE angiography on 7/28  1. PVD with claudication  - RLE angiography s/p PTA and stents to R external iliac artery overlapping smart nitinol self expanding stents (  8 mm x 80 mm, 8 mm x 40 mm)  - continue ASA, plavix, lipitor, Diovan-HCT  - stable for discharge, will order LE arterial doppler in 1 wk and 2-3 weeks cardiology followup  2. HTN  3. HLD  4. Tobacco abuse: counseled on cessation  5. Intermittent rale in R base: likely related to IVF perioperatively, his last myoview in 2014 showed EF 56%. He is on Diovan-HCTZ, expect symptom to improve after resume home med.   Signed, Almyra Deforest PA-C Pager: [phone]

## 2015-05-11 NOTE — Discharge Instructions (Signed)
No driving for 24 hours. No lifting over 5 lbs for 1 week. No sexual activity for 1 week. Keep procedure site clean & dry. If you notice increased pain, swelling, bleeding or pus, call/return!  You may shower, but no soaking baths/hot tubs/pools for 1 week.  ° ° °

## 2015-05-11 NOTE — Discharge Summary (Signed)
Discharge Summary   Patient ID: Tyrone Walton,  MRN: [id number], DOB/AGE: September 16, 1951 64 y.o.  Admit date: 05/10/2015 Discharge date: 05/11/2015  Primary Care Provider: [name] Primary Cardiologist: Dr. Gwenlyn Found  Discharge Diagnoses Principal Problem:   Claudication Active Problems:   PAD (peripheral artery disease)   Essential hypertension   Hyperlipidemia   Tobacco abuse   S/P arterial stent-04/12/15 PTA/Stent of Lt ext iliac   Allergies No Known Allergies  Procedures  Cardiac catheterization Procedures Performed: 1. Right iliac angiogram 2. PTA and stent right external iliac artery  IMPRESSION:successful right external iliac artery PTA and stenting of high-grade tandem stenoses with mitral supple extending stents. The patient was already on stool and type of therapy. The sheath was withdrawn across the bifurcation and exchanged over an 035 wire for a short 7 [place] sheath. The sheath will be removed once yesterday. Below 170 and pressure held. He'll be hydrated overnight and discharged home in the morning. He will get follow-up lower extremity arterial Doppler studies in our [facility] office next week. I will see him back 2-3 weeks thereafter    Hospital Course  The patient is a 64 year old African-American male with PMH of HTN, HLD and PVD. He was seen by Dr. Kellie Simmering in the past for PAD because of claudication, however Dopplers at the time were unrevealing. He had a negative Myoview on 06/02/2013 which showed EF 56% with no wall motion abnormality. He continued to smoke one pack per day. He had arterial Doppler study done at [facility] office which showed occluded SFAs bilaterally. He underwent diagnostic lower extremity angiography in June. He ended up with stent placement in his left external iliac artery, he continued to have residual long segmental occluded calcified left SFA which Dr. Gwenlyn Found was unable to cross last time. He presented  to [facility] on [facility]/2015 for staged right lower extremity angiography.  He underwent the PTCA and stents to R external iliac artery overlapping smart nitinol self expanding stents (8 mm x 80 mm, 8 mm x 40 mm). He was seen on the following day on 7/29, at which time he denies any significant discomfort at the cath site, he denies any chest discomfort. He have mild shortness of breath likely related to IVF received perioperatively, however his O2 saturation has been stable. He is deemed stable for discharge from cardiology perspective. I will arrange one week bilateral lower extremity arterial ultrasound and follow-up ultrasound after the last intervention on the left side on 06/12/2015. He will need 2-3 weeks follow-up with Dr. Gwenlyn Found in the office. I have encouraged him to stop smoking.   Only medication change during this admission is decreased dose of aspirin from 325 to 81 mg.  Discharge Vitals Blood pressure 98/48, pulse 75, temperature 98.5 F (36.9 C), temperature source Oral, resp. rate 19, height 5\' 8"  (1.727 m), weight 162 lb 11.2 oz (73.8 kg), SpO2 96 %.  Filed Weights   05/10/15 0551 05/11/15 0023  Weight: 161 lb (73.029 kg) 162 lb 11.2 oz (73.8 kg)    Labs  CBC  Recent Labs  05/09/15 0836 05/11/15 0305  WBC 6.5 8.7  HGB 12.5* 11.6*  HCT 38.1* 34.8*  MCV 90.9 89.5  PLT 208 081   Basic Metabolic Panel  Recent Labs  05/09/15 0836 05/11/15 0305  NA 141 138  K 4.0 3.8  CL 103 107  CO2 26 25  GLUCOSE 97 96  BUN 15 9  CREATININE 1.03 0.84  CALCIUM 9.3 8.8*  Disposition  Pt is being discharged home today in good condition.  Follow-up Plans & Appointments      Follow-up Information    Follow up with Quay Burow, MD On 05/22/2015.   Specialties:  Cardiology, Radiology   Why:  3:45pm   Contact information:   [address] [place] [address] [phone]       Follow up with [facility].   Specialty:  Cardiology    Why:  Office scheduler will contact you to arrange 1 week LE arterial doppler after your recent stent, please give [place] a call if you do not hear from [place] in 2 business days   Contact information:   [address] [place] [phone]      Discharge Medications    Medication List    STOP taking these medications        aspirin 325 MG EC tablet  Replaced by:  aspirin 81 MG chewable tablet      TAKE these medications        acetaminophen 325 MG tablet  Commonly known as:  TYLENOL  Take 2 tablets (650 mg total) by mouth every 4 (four) hours as needed for headache or mild pain.     aspirin 81 MG chewable tablet  Chew 1 tablet (81 mg total) by mouth daily.     atorvastatin 10 MG tablet  Commonly known as:  LIPITOR  Take 10 mg by mouth daily.     CHANTIX 0.5 MG tablet  Generic drug:  varenicline  Take 0.5 mg by mouth 2 (two) times daily.     cholecalciferol 400 UNITS Tabs tablet  Commonly known as:  VITAMIN D  Take 400 Units by mouth daily.     clopidogrel 75 MG tablet  Commonly known as:  PLAVIX  Take 1 tablet (75 mg total) by mouth daily with breakfast.     FLEXERIL 5 MG tablet  Generic drug:  cyclobenzaprine  Take 5 mg by mouth as needed for muscle spasms.     magnesium oxide 400 MG tablet  Commonly known as:  MAG-OX  Take 400 mg by mouth daily.     ONE-A-DAY MENS 50+ ADVANTAGE Tabs  Take 1 tablet by mouth daily.     tamsulosin 0.4 MG Caps capsule  Commonly known as:  FLOMAX  Take 0.4 mg by mouth at bedtime.     valsartan-hydrochlorothiazide 320-25 MG per tablet  Commonly known as:  DIOVAN-HCT  Take 1 tablet by mouth daily.        Outstanding Labs/Studies  Outpatient LE arterial doppler  Duration of Discharge Encounter   Greater than 30 minutes including physician time.  Hilbert Corrigan PA-C Pager: [phone] 05/11/2015, 8:18 AM

## 2015-05-11 NOTE — Progress Notes (Signed)
Pt with order to discharge. Discharge instructions, educations,dr appointment, groin site care  provided. Walked in the hallway without complications. V/S stable. IV removed.Discharge pt with wife at bedside.

## 2015-05-11 NOTE — Progress Notes (Signed)
   Doing well.  No chest or leg pain.  Ready for DC.

## 2015-05-15 ENCOUNTER — Other Ambulatory Visit: Payer: Self-pay | Admitting: Physician Assistant

## 2015-05-15 DIAGNOSIS — I739 Peripheral vascular disease, unspecified: Secondary | ICD-10-CM

## 2015-05-18 ENCOUNTER — Ambulatory Visit (HOSPITAL_BASED_OUTPATIENT_CLINIC_OR_DEPARTMENT_OTHER)
Admission: RE | Admit: 2015-05-18 | Discharge: 2015-05-18 | Disposition: A | Discharge status: Home / Self Care | Payer: BC Managed Care – PPO | Source: Ambulatory Visit | Attending: Cardiovascular Disease | Admitting: Cardiovascular Disease

## 2015-05-18 ENCOUNTER — Ambulatory Visit (HOSPITAL_COMMUNITY)
Admission: RE | Admit: 2015-05-18 | Discharge: 2015-05-18 | Disposition: A | Discharge status: Home / Self Care | Payer: BC Managed Care – PPO | Source: Ambulatory Visit | Attending: Physician Assistant | Admitting: Physician Assistant

## 2015-05-18 DIAGNOSIS — I739 Peripheral vascular disease, unspecified: Secondary | ICD-10-CM | POA: Insufficient documentation

## 2015-05-22 ENCOUNTER — Encounter: Payer: Self-pay | Admitting: Cardiovascular Disease

## 2015-05-22 ENCOUNTER — Ambulatory Visit (INDEPENDENT_AMBULATORY_CARE_PROVIDER_SITE_OTHER): Payer: BC Managed Care – PPO | Admitting: Cardiovascular Disease

## 2015-05-22 VITALS — BP 126/76 | HR 68 | Ht 68.0 in | Wt 163.0 lb

## 2015-05-22 DIAGNOSIS — Z720 Tobacco use: Secondary | ICD-10-CM

## 2015-05-22 DIAGNOSIS — I10 Essential (primary) hypertension: Secondary | ICD-10-CM

## 2015-05-22 DIAGNOSIS — Z9889 Other specified postprocedural states: Secondary | ICD-10-CM | POA: Diagnosis not present

## 2015-05-22 DIAGNOSIS — E785 Hyperlipidemia, unspecified: Secondary | ICD-10-CM

## 2015-05-22 DIAGNOSIS — Z959 Presence of cardiac and vascular implant and graft, unspecified: Secondary | ICD-10-CM

## 2015-05-22 NOTE — Progress Notes (Signed)
05/22/2015 Tyrone Walton   07-25-1951  [id number]  Primary Physician [name] Primary Cardiologist: Lorretta Harp MD Renae Gloss   HPI:  Tyrone Walton is a 64 year old married African-American male who by his wife today. He was referred by Dr. Melony Overly , from [facility] podiatry, for peripheral vascular evaluation. He has a history of treated hypertension and hyperlipidemia. He has seen Dr. Kellie Simmering in the past for peripheral evaluation because of claudication but Dopplers were unrevealing. He's had a negative Myoview stress test performed 06/02/13. His cardiac risk factors include treated hypertension, hyponatremia and tobacco abuse smoking one pack per day. His brother died of a myocardial infarction at age 43. He has never had a heart attack or stroke and denies chest pain or shortness of breath. He does complain of right greater than left lower extremity claudication which is lifestyle limiting of less than 100 yards. He has an abnormal right fifth toe nail and was scheduled to have this evaluated by Dr. Melony Overly did not feel good peripheral pulses and referred him for further evaluation. Lower extremity arterial Doppler studies in our office confirmed that he had occluded SFAs bilaterally. Based on this, it was decided to proceed with angiography and potential intervention for lifestyle limiting claudication. This occurred on 04/12/15. I did end up spending his left external iliac artery reducing an 80% stenosis to 0% residual. He long segmentally occluded calcified left SFA which I was unable to cross. Since discharge his left lower external claudication has significantly improved. He had residual right lower; occasion after his left external iliac artery intervention. On 05/10/15 I re-angiogram him with the intent of fixing his right SFA however, I discovered significant disease in the entire right external iliac artery which I stented. This resulted in marked improvement in his  claudication and increase in his right ABI from 0.6 1.--->>> 71. He no longer is limited by claudication and is able to perform his job without limitation. He is trying to stop smoking.  Current Outpatient Prescriptions  Medication Sig Dispense Refill  . acetaminophen (TYLENOL) 325 MG tablet Take 2 tablets (650 mg total) by mouth every 4 (four) hours as needed for headache or mild pain.    Marland Kitchen aspirin 81 MG chewable tablet Chew 1 tablet (81 mg total) by mouth daily.    Marland Kitchen atorvastatin (LIPITOR) 10 MG tablet Take 10 mg by mouth daily.    . CHANTIX 0.5 MG tablet Take 0.5 mg by mouth 2 (two) times daily.   0  . cholecalciferol (VITAMIN D) 400 UNITS TABS tablet Take 400 Units by mouth daily.     . clopidogrel (PLAVIX) 75 MG tablet Take 1 tablet (75 mg total) by mouth daily with breakfast. 30 tablet 6  . cyclobenzaprine (FLEXERIL) 5 MG tablet Take 5 mg by mouth as needed for muscle spasms.    . magnesium oxide (MAG-OX) 400 MG tablet Take 400 mg by mouth daily. Every other day    . Multiple Vitamins-Minerals (ONE-A-DAY MENS 50+ ADVANTAGE) TABS Take 1 tablet by mouth daily.    . tamsulosin (FLOMAX) 0.4 MG CAPS capsule Take 0.4 mg by mouth at bedtime.  0  . valsartan-hydrochlorothiazide (DIOVAN-HCT) 320-25 MG per tablet Take 1 tablet by mouth daily.     No current facility-administered medications for this visit.    No Known Allergies  History   Social History  . Marital Status: Married    Spouse Name: N/A  . Number of Children: N/A  . Years of  Education: N/A   Occupational History  . Not on file.   Social History Main Topics  . Smoking status: Current Every Day Smoker -- 0.50 packs/day for 49 years    Types: Cigarettes  . Smokeless tobacco: Never Used  . Alcohol Use: 3.6 oz/week    6 Cans of beer per week     Comment: 05/10/2015 "I only drink on the weekends; down from 12 to 6 cans of beer"  . Drug Use: No  . Sexual Activity: Yes   Other Topics Concern  . Not on file   Social  History Narrative     Review of Systems: General: negative for chills, fever, night sweats or weight changes.  Cardiovascular: negative for chest pain, dyspnea on exertion, edema, orthopnea, palpitations, paroxysmal nocturnal dyspnea or shortness of breath Dermatological: negative for rash Respiratory: negative for cough or wheezing Urologic: negative for hematuria Abdominal: negative for nausea, vomiting, diarrhea, bright red blood per rectum, melena, or hematemesis Neurologic: negative for visual changes, syncope, or dizziness All other systems reviewed and are otherwise negative except as noted above.    Blood pressure 126/76, pulse 68, height 5\' 8"  (1.727 m), weight 163 lb (73.936 kg).  General appearance: alert and no distress Neck: no adenopathy, no carotid bruit, no JVD, supple, symmetrical, trachea midline and thyroid not enlarged, symmetric, no tenderness/mass/nodules Lungs: clear to auscultation bilaterally Heart: regular rate and rhythm, S1, S2 normal, no murmur, click, rub or gallop Extremities: extremities normal, atraumatic, no cyanosis or edema  EKG not performed today  ASSESSMENT AND PLAN:   Tobacco abuse Continues to smoke 5 cigarettes a day ago is attempting to stop  S/P arterial stent-04/12/15 PTA/Stent of Lt ext iliac History of peripheral arterial disease status post initial angiogram on 04/12/15 for claudication. He had occluded SFAs bilaterally with iliac disease as well. He had 2 vessel runoff bilaterally. Stenting his left external iliac artery with a favorable clinical results. I brought him back in a staged fashion on 05/10/15 with the intent of fixing his right SFA. At the time of the procedure I noted that his right external iliac artery had hemodynamically stable he can't disease and ended up stenting the entire vessel. This has resulted in marked improvement in his claudication. His right ABI has improved from 0.61 up to .71. At this point, I do not feel  compelled to pursue revascularization of his SFA since he is not limited by claudication. He is on dual antiplatelets therapy. I will recheck lower extremity until Doppler studies in 6 months and I will see him back after that for further evaluation.  Hyperlipidemia History of hyperlipidemia or torsed and 10 mg today followed by his PCP  Essential hypertension History of hypertension blood pressure measured at 126/76. He is on Diovan and hydrochlorothiazide. Continue current meds at current dosing      Lorretta Harp MD [facility], [facility] 05/22/2015 4:06 PM

## 2015-05-22 NOTE — Assessment & Plan Note (Signed)
History of hyperlipidemia or torsed and 10 mg today followed by his PCP

## 2015-05-22 NOTE — Patient Instructions (Signed)
Your physician wants you to follow-up in: 6 months after your dopplers.  You will receive a reminder letter in the mail two months in advance. If you don't receive a letter, please call our office to schedule the follow-up appointment.

## 2015-05-22 NOTE — Assessment & Plan Note (Signed)
History of hypertension blood pressure measured at 126/76. He is on Diovan and hydrochlorothiazide. Continue current meds at current dosing

## 2015-05-22 NOTE — Assessment & Plan Note (Signed)
Continues to smoke 5 cigarettes a day ago is attempting to stop

## 2015-05-22 NOTE — Assessment & Plan Note (Signed)
History of peripheral arterial disease status post initial angiogram on 04/12/15 for claudication. He had occluded SFAs bilaterally with iliac disease as well. He had 2 vessel runoff bilaterally. Stenting his left external iliac artery with a favorable clinical results. I brought him back in a staged fashion on 05/10/15 with the intent of fixing his right SFA. At the time of the procedure I noted that his right external iliac artery had hemodynamically stable he can't disease and ended up stenting the entire vessel. This has resulted in marked improvement in his claudication. His right ABI has improved from 0.61 up to .71. At this point, I do not feel compelled to pursue revascularization of his SFA since he is not limited by claudication. He is on dual antiplatelets therapy. I will recheck lower extremity until Doppler studies in 6 months and I will see him back after that for further evaluation.

## 2015-10-10 IMAGING — CR DG CHEST 2V
2 series · 2 of 2 positions shown · non-contrast
Comparison: None.

CLINICAL DATA: Pre operative respiratory exam.

EXAM:
CHEST  2 VIEW

[w chest pa]
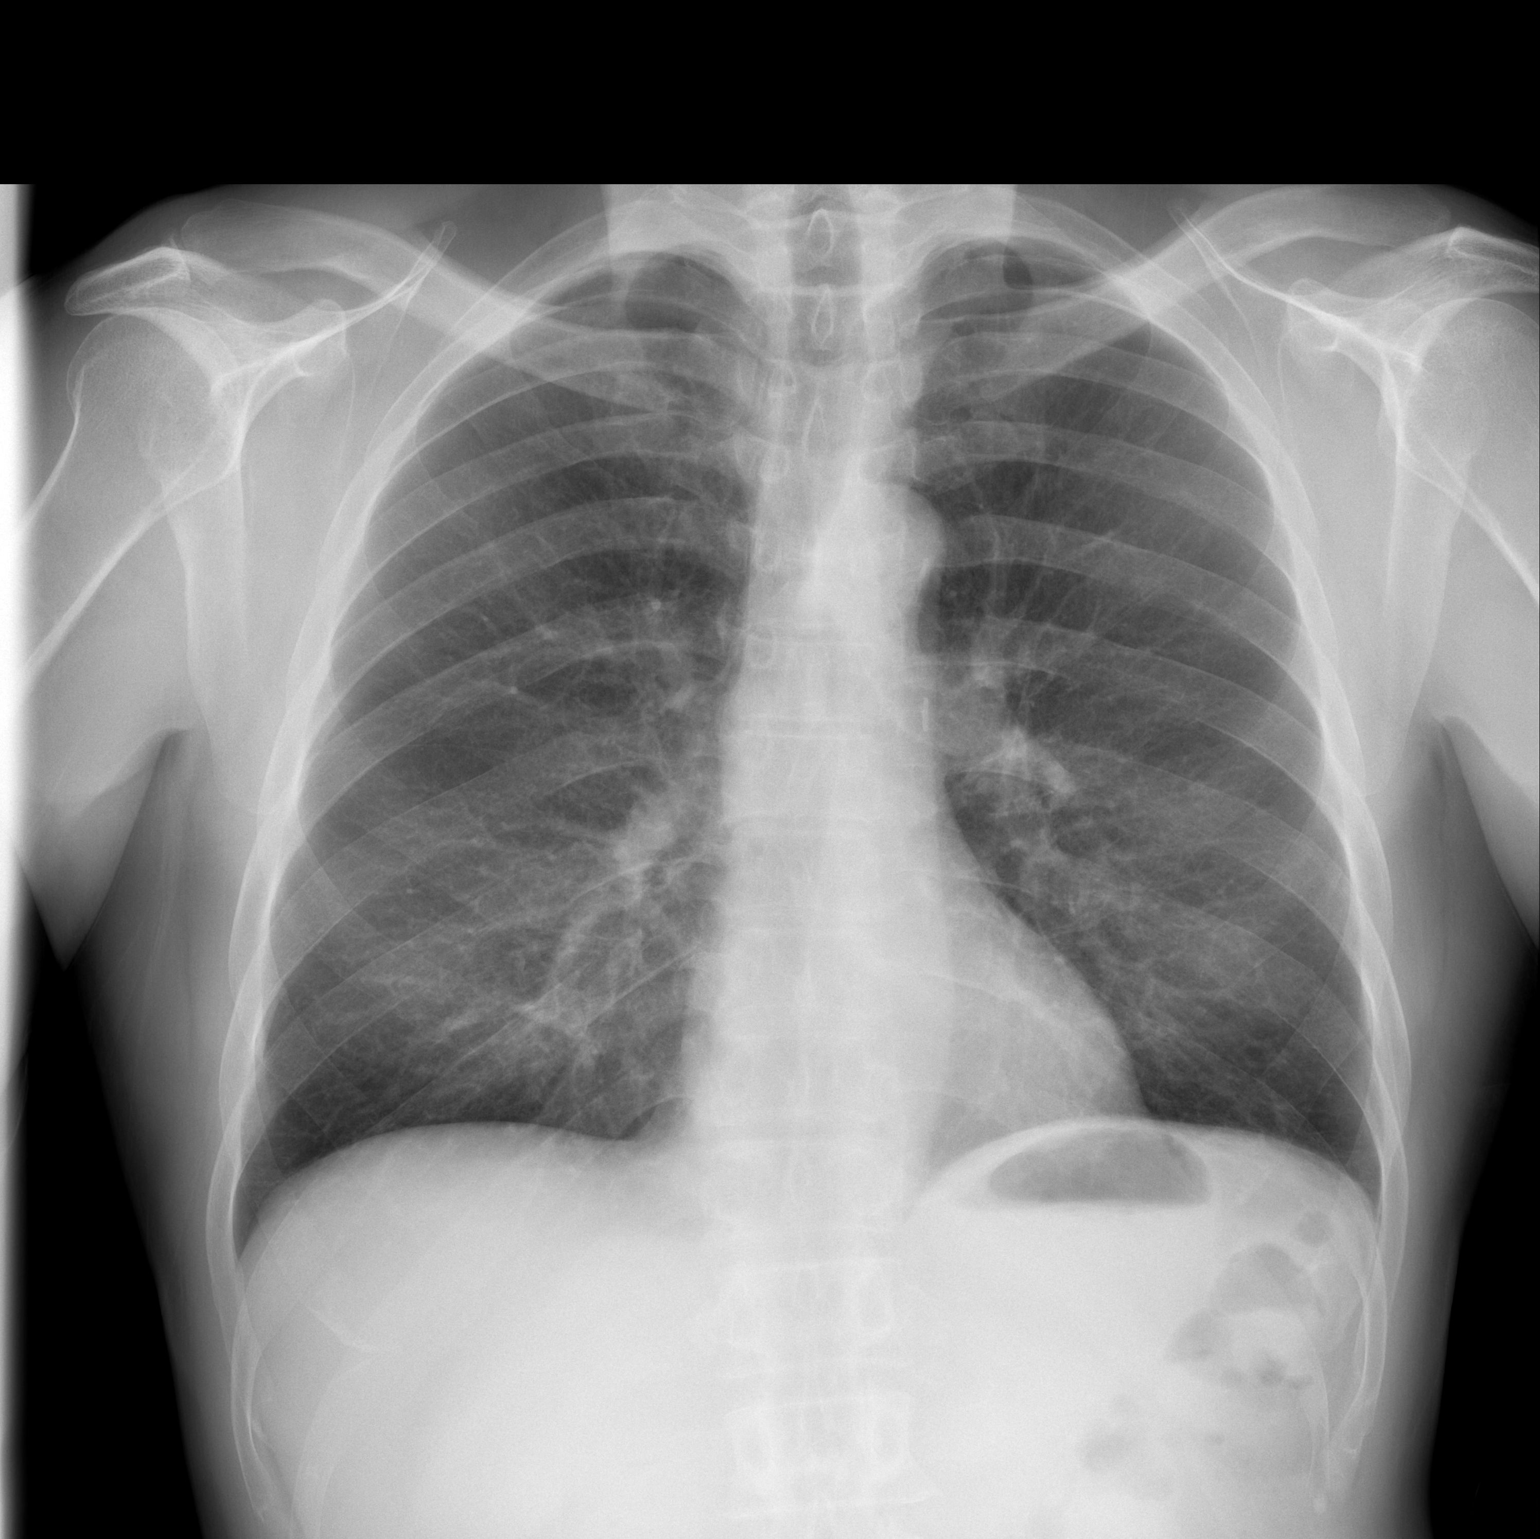

[w chest lat]
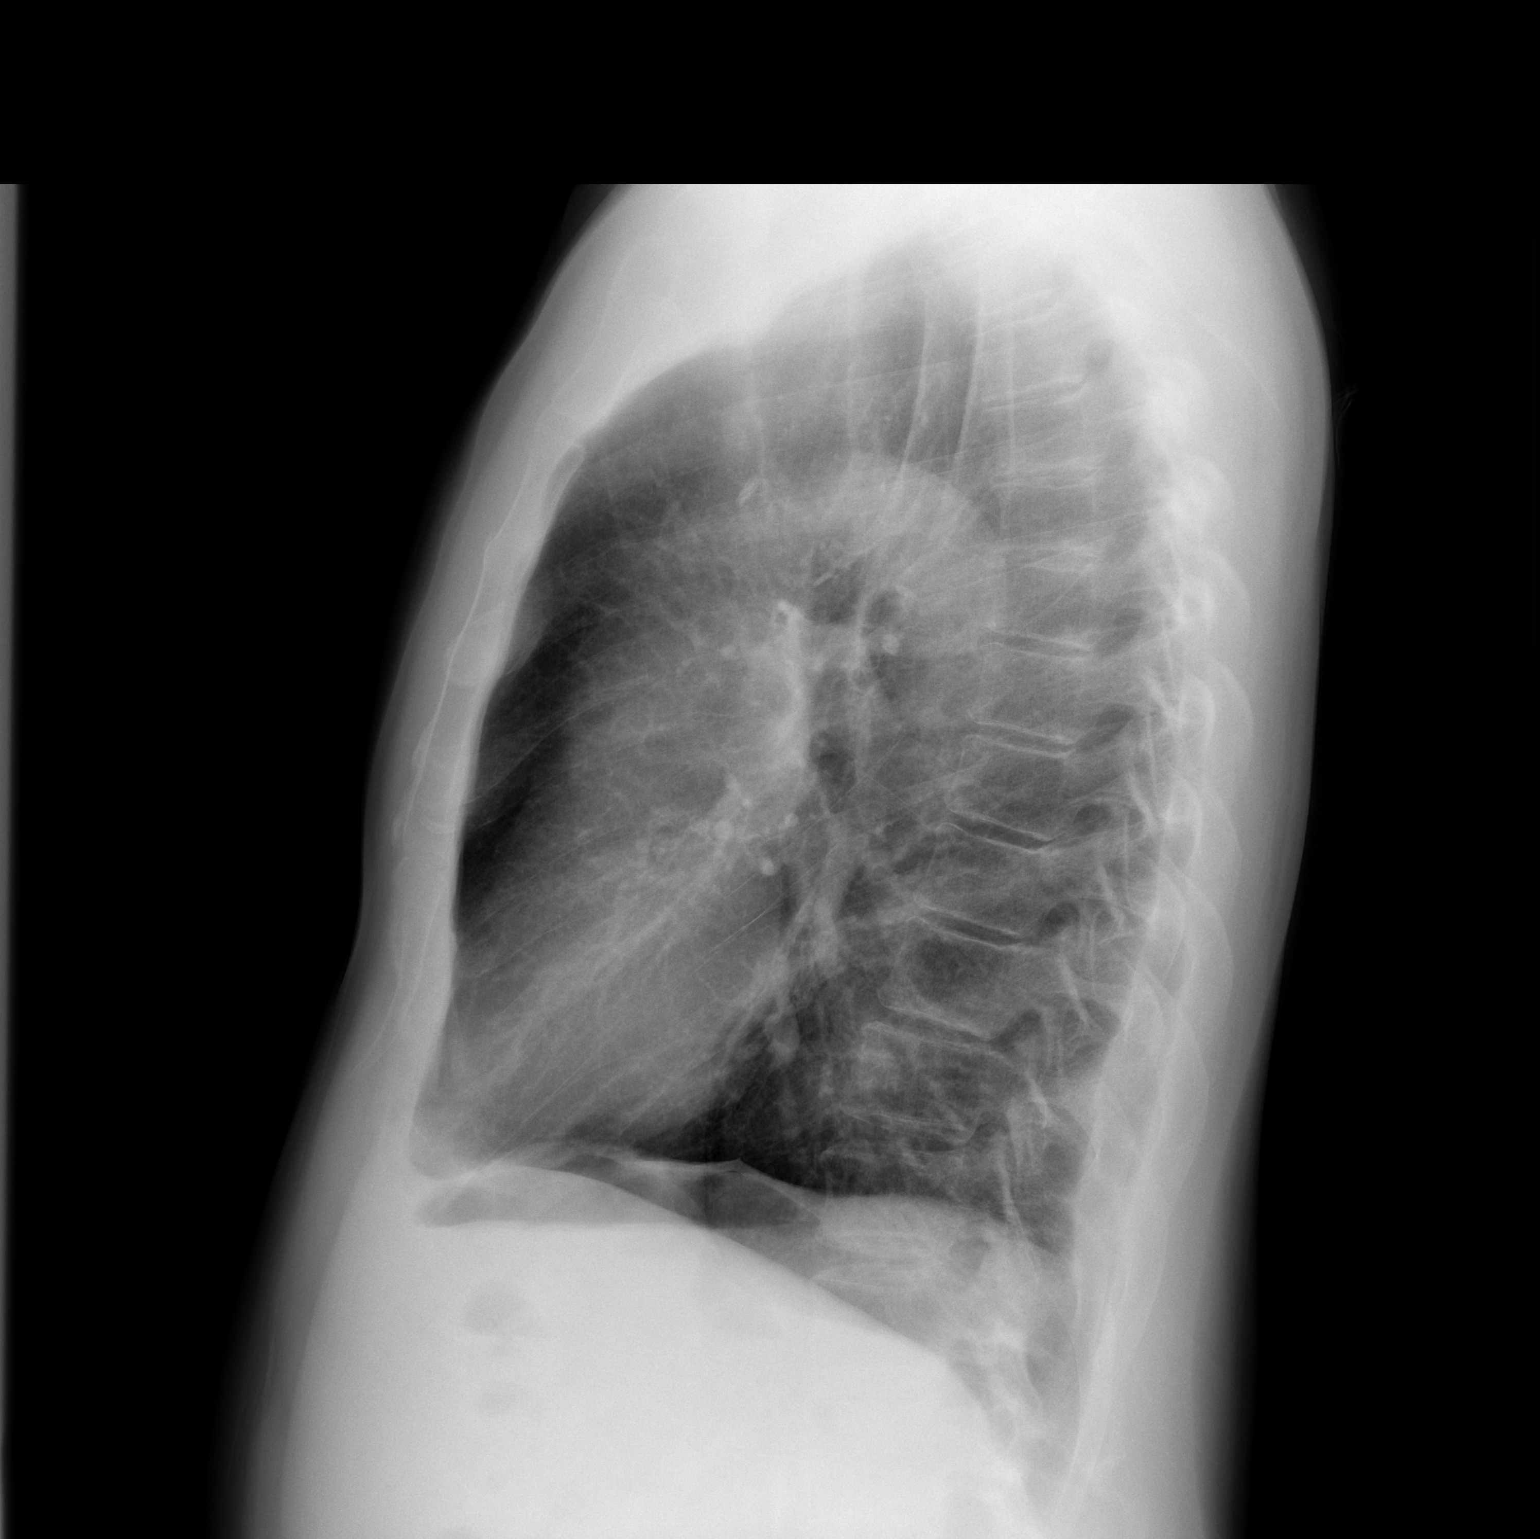

[2 of 2 positions shown; findings below may reference images not displayed]

FINDINGS: The heart size and mediastinal contours are within normal limits.
Both lungs are clear. The visualized skeletal structures are
unremarkable. Slight calcification in the thoracic aorta.
IMPRESSION: No significant abnormality.

## 2015-11-13 DIAGNOSIS — Z1211 Encounter for screening for malignant neoplasm of colon: Secondary | ICD-10-CM | POA: Diagnosis not present

## 2015-11-13 DIAGNOSIS — K5900 Constipation, unspecified: Secondary | ICD-10-CM | POA: Diagnosis not present

## 2015-11-14 ENCOUNTER — Telehealth: Payer: Self-pay | Admitting: Cardiovascular Disease

## 2015-11-14 NOTE — Telephone Encounter (Signed)
Returned call to patient's wife no answer.LMTC. 

## 2015-11-14 NOTE — Telephone Encounter (Signed)
°*  STAT* If patient is at the pharmacy, call can be transferred to refill team.   1. Which medications need to be refilled? (please list name of each medication and dose if known) Plavix don't know mg  2. Which pharmacy/location (including street and city if local pharmacy) is medication to be sent to? [facility]/[address]  3. Do they need a 30 day or 90 day supply? Don't know supply

## 2015-11-15 MED ORDER — CLOPIDOGREL BISULFATE 75 MG PO TABS: 75.0000 mg | ORAL_TABLET | Freq: Every day | ORAL | Status: DC

## 2015-11-15 NOTE — Telephone Encounter (Signed)
Wife took call. Informed her refills would be sent to pharmacy today, she is aware. Also acknowledged pt due for appt - pt will call later today or this week to set up.

## 2015-11-15 NOTE — Telephone Encounter (Signed)
Follow up      Returning a call to the nurse to get plavix 75mg  refilled at [facility] at [address].  Please call wife.

## 2015-11-28 DIAGNOSIS — Z1212 Encounter for screening for malignant neoplasm of rectum: Secondary | ICD-10-CM | POA: Diagnosis not present

## 2015-11-28 DIAGNOSIS — Z1211 Encounter for screening for malignant neoplasm of colon: Secondary | ICD-10-CM | POA: Diagnosis not present

## 2016-01-08 ENCOUNTER — Ambulatory Visit (INDEPENDENT_AMBULATORY_CARE_PROVIDER_SITE_OTHER): Payer: BC Managed Care – PPO | Admitting: Cardiovascular Disease

## 2016-01-08 ENCOUNTER — Encounter: Payer: Self-pay | Admitting: Cardiovascular Disease

## 2016-01-08 VITALS — BP 100/72 | HR 79 | Ht 68.0 in | Wt 156.4 lb

## 2016-01-08 DIAGNOSIS — E785 Hyperlipidemia, unspecified: Secondary | ICD-10-CM

## 2016-01-08 DIAGNOSIS — I739 Peripheral vascular disease, unspecified: Secondary | ICD-10-CM

## 2016-01-08 DIAGNOSIS — I10 Essential (primary) hypertension: Secondary | ICD-10-CM | POA: Diagnosis not present

## 2016-01-08 NOTE — Assessment & Plan Note (Signed)
History of peripheral arterial disease angiographically documented by myself in June and July of last year. He has occluded SFAs bilaterally status post bilateral iliac stenting on 04/12/15 of his left external iliac artery and 05/10/15 of his right external iliac artery he did have 2 vessel runoff bilaterally with occluded anterior tibials. Since I started his iliac arteries as claudication is markedly improved. His most recent Doppler studies performed 05/18/15 revealed widely patent iliac stents.

## 2016-01-08 NOTE — Assessment & Plan Note (Signed)
History of hypertension blood pressure measured at 100/72. He is on Diovan and hydrochlorothiazide. Continue current meds at current dosing

## 2016-01-08 NOTE — Assessment & Plan Note (Signed)
History of hyperlipidemia on atorvastatin followed by his PCP 

## 2016-01-08 NOTE — Progress Notes (Signed)
01/08/2016 Tyrone Walton   08-16-1951  [id number]  Primary Physician [name] Primary Cardiologist: Lorretta Harp MD Renae Gloss   HPI:  Mr. Tyrone Walton is a 65 year old married African-American male who by his wife today. He was referred by Dr. Melony Overly , from [facility] podiatry, for peripheral vascular evaluation. I last saw him in the office 05/22/15.He has a history of treated hypertension and hyperlipidemia. He has seen Dr. Kellie Simmering in the past for peripheral evaluation because of claudication but Dopplers were unrevealing. He's had a negative Myoview stress test performed 06/02/13. His cardiac risk factors include treated hypertension, hyponatremia and tobacco abuse smoking one pack per day. His brother died of a myocardial infarction at age 61. He has never had a heart attack or stroke and denies chest pain or shortness of breath. He does complain of right greater than left lower extremity claudication which is lifestyle limiting of less than 100 yards. He has an abnormal right fifth toe nail and was scheduled to have this evaluated by Dr. Melony Overly did not feel good peripheral pulses and referred him for further evaluation. Lower extremity arterial Doppler studies in our office confirmed that he had occluded SFAs bilaterally. Based on this, it was decided to proceed with angiography and potential intervention for lifestyle limiting claudication. This occurred on 04/12/15. I did end up spending his left external iliac artery reducing an 80% stenosis to 0% residual. He long segmentally occluded calcified left SFA which I was unable to cross. Since discharge his left lower external claudication has significantly improved. He had residual right lower; occasion after his left external iliac artery intervention. On 05/10/15 I re-angiogram him with the intent of fixing his right SFA however, I discovered significant disease in the entire right external iliac artery which I stented. This resulted in  marked improvement in his claudication and increase in his right ABI from 0.6 1.--->>> 71. He no longer is limited by claudication and is able to perform his job without limitation. He has stopped smoking since I last saw him.   Current Outpatient Prescriptions  Medication Sig Dispense Refill  . acetaminophen (TYLENOL) 325 MG tablet Take 2 tablets (650 mg total) by mouth every 4 (four) hours as needed for headache or mild pain.    Marland Kitchen aspirin 81 MG chewable tablet Chew 1 tablet (81 mg total) by mouth daily.    Marland Kitchen atorvastatin (LIPITOR) 10 MG tablet Take 10 mg by mouth daily.    . cholecalciferol (VITAMIN D) 400 UNITS TABS tablet Take 400 Units by mouth daily.     . clopidogrel (PLAVIX) 75 MG tablet Take 1 tablet (75 mg total) by mouth daily with breakfast. 30 tablet 6  . cyclobenzaprine (FLEXERIL) 5 MG tablet Take 5 mg by mouth 2 (two) times daily as needed for muscle spasms.     Marland Kitchen gabapentin (NEURONTIN) 100 MG capsule Take 500 mg by mouth at bedtime.  0  . magnesium oxide (MAG-OX) 400 MG tablet Take 400 mg by mouth daily. Every other day    . Multiple Vitamins-Minerals (ONE-A-DAY MENS 50+ ADVANTAGE) TABS Take 1 tablet by mouth daily.    . tamsulosin (FLOMAX) 0.4 MG CAPS capsule Take 0.4 mg by mouth at bedtime.  0  . valsartan-hydrochlorothiazide (DIOVAN-HCT) 320-25 MG per tablet Take 1 tablet by mouth daily.     No current facility-administered medications for this visit.    No Known Allergies  Social History   Social History  . Marital Status: Married  Spouse Name: N/A  . Number of Children: N/A  . Years of Education: N/A   Occupational History  . Not on file.   Social History Main Topics  . Smoking status: Former Smoker -- 0.50 packs/day for 49 years    Types: Cigarettes    Quit date: 04/13/2015  . Smokeless tobacco: Never Used     Comment: Currently use Vapor  . Alcohol Use: 3.6 oz/week    6 Cans of beer per week     Comment: 05/10/2015 "I only drink on the weekends; down  from 12 to 6 cans of beer"  . Drug Use: No  . Sexual Activity: Yes   Other Topics Concern  . Not on file   Social History Narrative     Review of Systems: General: negative for chills, fever, night sweats or weight changes.  Cardiovascular: negative for chest pain, dyspnea on exertion, edema, orthopnea, palpitations, paroxysmal nocturnal dyspnea or shortness of breath Dermatological: negative for rash Respiratory: negative for cough or wheezing Urologic: negative for hematuria Abdominal: negative for nausea, vomiting, diarrhea, bright red blood per rectum, melena, or hematemesis Neurologic: negative for visual changes, syncope, or dizziness All other systems reviewed and are otherwise negative except as noted above.    Blood pressure 100/72, pulse 79, height 5\' 8"  (1.727 m), weight 156 lb 6.4 oz (70.943 kg).  General appearance: alert and no distress Neck: no adenopathy, no carotid bruit, no JVD, supple, symmetrical, trachea midline and thyroid not enlarged, symmetric, no tenderness/mass/nodules Lungs: clear to auscultation bilaterally Heart: regular rate and rhythm, S1, S2 normal, no murmur, click, rub or gallop Extremities: extremities normal, atraumatic, no cyanosis or edema  EKG not performed today  ASSESSMENT AND PLAN:   PAD (peripheral artery disease) History of peripheral arterial disease angiographically documented by myself in June and July of last year. He has occluded SFAs bilaterally status post bilateral iliac stenting on 04/12/15 of his left external iliac artery and 05/10/15 of his right external iliac artery he did have 2 vessel runoff bilaterally with occluded anterior tibials. Since I started his iliac arteries as claudication is markedly improved. His most recent Doppler studies performed 05/18/15 revealed widely patent iliac stents.  Essential hypertension History of hypertension blood pressure measured at 100/72. He is on Diovan and hydrochlorothiazide. Continue  current meds at current dosing  Hyperlipidemia History of hyperlipidemia on atorvastatin followed by his PCP      Lorretta Harp MD [facility], [facility] 01/08/2016 12:42 PM

## 2016-01-08 NOTE — Patient Instructions (Addendum)
Medication Instructions:  Your physician recommends that you continue on your current medications as directed. Please refer to the Current Medication list given to you today.   Labwork: NONE  Testing/Procedures: Your physician has requested that you have a lower extremity arterial doppler- During this test, ultrasound is used to evaluate arterial blood flow in the legs. Allow approximately one hour for this exam. SCHEDULED FOR AUGUST 2017   Follow-Up: Your physician wants you to follow-up in: [place]. You will receive a reminder letter in the mail two months in advance. If you don't receive a letter, please call our office to schedule the follow-up appointment.   Any Other Special Instructions Will Be Listed Below (If Applicable).  PT CLEARED FOR COLONOSCOPY AND OK TO STOP PLAVIX PER DR BERRY.    If you need a refill on your cardiac medications before your next appointment, please call your pharmacy.

## 2016-02-15 ENCOUNTER — Telehealth: Payer: Self-pay | Admitting: Cardiovascular Disease

## 2016-02-15 DIAGNOSIS — Z1211 Encounter for screening for malignant neoplasm of colon: Secondary | ICD-10-CM | POA: Diagnosis not present

## 2016-02-15 DIAGNOSIS — D120 Benign neoplasm of cecum: Secondary | ICD-10-CM | POA: Diagnosis not present

## 2016-02-15 LAB — HM COLONOSCOPY

## 2016-02-15 NOTE — Telephone Encounter (Signed)
Butch Penny calling from [facility].  Dr Collene Mares removed a small polyp (<1cm) from the cecum.  Dr Collene Mares wants to know how long can patient stay off of plavix. She stated that they usually keep patient's off antiplatelet therapy for 2-4 weeks.  Please advise.

## 2016-02-15 NOTE — Telephone Encounter (Signed)
New message    Patient had procedure today colonoscopy   Request for surgical clearance:  1. What type of surgery is being performed? colonoscopy  2. When is this surgery scheduled? Today right now finished pt in recovery  3. Are there any medications that need to be held prior to surgery and how long? Plavix Dr. Gwenlyn Found approved the pt to be off medication before the procedure     4. Name of physician performing surgery? Dr. Youlanda Mighty    5. What is your office phone and fax number? [phone]

## 2016-02-15 NOTE — Telephone Encounter (Signed)
OK to stay off plavix for 2-4 weeks. Last procedure was in June 2016 for PAD.

## 2016-02-15 NOTE — Telephone Encounter (Signed)
Returned call to Butch Penny at [facility] (with Dr Collene Mares).  Advised her of Dr Lurline Del recommendations.

## 2016-06-02 ENCOUNTER — Other Ambulatory Visit: Payer: Self-pay | Admitting: Cardiovascular Disease

## 2016-06-02 DIAGNOSIS — I739 Peripheral vascular disease, unspecified: Secondary | ICD-10-CM

## 2016-06-04 ENCOUNTER — Encounter (HOSPITAL_COMMUNITY): Payer: Medicare Other

## 2016-06-09 ENCOUNTER — Other Ambulatory Visit: Payer: Self-pay | Admitting: *Deleted

## 2016-06-09 ENCOUNTER — Ambulatory Visit (HOSPITAL_COMMUNITY)
Admission: RE | Admit: 2016-06-09 | Discharge: 2016-06-09 | Disposition: A | Discharge status: Home / Self Care | Payer: Medicare Other | Source: Ambulatory Visit | Attending: Cardiovascular Disease | Admitting: Cardiovascular Disease

## 2016-06-09 DIAGNOSIS — I10 Essential (primary) hypertension: Secondary | ICD-10-CM

## 2016-06-09 DIAGNOSIS — I739 Peripheral vascular disease, unspecified: Secondary | ICD-10-CM

## 2016-06-09 DIAGNOSIS — E785 Hyperlipidemia, unspecified: Secondary | ICD-10-CM | POA: Insufficient documentation

## 2016-06-09 DIAGNOSIS — R938 Abnormal findings on diagnostic imaging of other specified body structures: Secondary | ICD-10-CM | POA: Diagnosis not present

## 2016-06-09 DIAGNOSIS — I708 Atherosclerosis of other arteries: Secondary | ICD-10-CM | POA: Insufficient documentation

## 2016-06-09 DIAGNOSIS — I700 Atherosclerosis of aorta: Secondary | ICD-10-CM | POA: Diagnosis not present

## 2016-06-09 DIAGNOSIS — Z720 Tobacco use: Secondary | ICD-10-CM | POA: Insufficient documentation

## 2016-06-09 DIAGNOSIS — Z959 Presence of cardiac and vascular implant and graft, unspecified: Secondary | ICD-10-CM

## 2016-07-07 ENCOUNTER — Other Ambulatory Visit: Payer: Self-pay | Admitting: Cardiovascular Disease

## 2017-02-07 ENCOUNTER — Other Ambulatory Visit: Payer: Self-pay | Admitting: Cardiovascular Disease

## 2017-02-09 NOTE — Telephone Encounter (Signed)
REFILL 

## 2017-03-13 ENCOUNTER — Telehealth: Payer: Self-pay | Admitting: Cardiovascular Disease

## 2017-03-13 MED ORDER — CLOPIDOGREL BISULFATE 75 MG PO TABS: ORAL_TABLET | ORAL | 3 refills | Status: DC

## 2017-03-13 NOTE — Telephone Encounter (Signed)
°  New Prob   *STAT* If patient is at the pharmacy, call can be transferred to refill team.   1. Which medications need to be refilled? (please list name of each medication and dose if known): clopidogrel (PLAVIX) 75 MG tablet  2. Which pharmacy/location (including street and city if local pharmacy) is medication to be sent to? [place] [address].  3. Do they need a 30 day or 90 day supply? 90 days

## 2017-03-13 NOTE — Telephone Encounter (Signed)
Refill sent to the pharmacy electronically.  

## 2017-07-10 ENCOUNTER — Emergency Department (HOSPITAL_COMMUNITY): Payer: Medicare Other

## 2017-07-10 ENCOUNTER — Encounter (HOSPITAL_COMMUNITY): Payer: Self-pay | Admitting: Emergency Medicine

## 2017-07-10 ENCOUNTER — Emergency Department (HOSPITAL_COMMUNITY)
Admission: EM | Admit: 2017-07-10 | Discharge: 2017-07-10 | Disposition: A | Discharge status: Home / Self Care | Payer: Medicare Other | Attending: Emergency Medicine | Admitting: Emergency Medicine

## 2017-07-10 DIAGNOSIS — I10 Essential (primary) hypertension: Secondary | ICD-10-CM | POA: Diagnosis not present

## 2017-07-10 DIAGNOSIS — K828 Other specified diseases of gallbladder: Secondary | ICD-10-CM

## 2017-07-10 DIAGNOSIS — K839 Disease of biliary tract, unspecified: Secondary | ICD-10-CM | POA: Insufficient documentation

## 2017-07-10 DIAGNOSIS — R1011 Right upper quadrant pain: Secondary | ICD-10-CM | POA: Diagnosis present

## 2017-07-10 DIAGNOSIS — Z7982 Long term (current) use of aspirin: Secondary | ICD-10-CM | POA: Insufficient documentation

## 2017-07-10 DIAGNOSIS — Z79899 Other long term (current) drug therapy: Secondary | ICD-10-CM | POA: Diagnosis not present

## 2017-07-10 DIAGNOSIS — Z87891 Personal history of nicotine dependence: Secondary | ICD-10-CM | POA: Insufficient documentation

## 2017-07-10 DIAGNOSIS — E785 Hyperlipidemia, unspecified: Secondary | ICD-10-CM | POA: Insufficient documentation

## 2017-07-10 DIAGNOSIS — I2510 Atherosclerotic heart disease of native coronary artery without angina pectoris: Secondary | ICD-10-CM | POA: Insufficient documentation

## 2017-07-10 LAB — HEPATIC FUNCTION PANEL
ALK PHOS: 67 U/L (ref 38–126)
ALT: 21 U/L (ref 17–63)
AST: 25 U/L (ref 15–41)
Albumin: 4.1 g/dL (ref 3.5–5.0)
BILIRUBIN TOTAL: 0.5 mg/dL (ref 0.3–1.2)
Total Protein: 7.3 g/dL (ref 6.5–8.1)

## 2017-07-10 LAB — I-STAT TROPONIN, ED: TROPONIN I, POC: 0.01 ng/mL (ref 0.00–0.08)

## 2017-07-10 LAB — URINALYSIS, ROUTINE W REFLEX MICROSCOPIC
BILIRUBIN URINE: NEGATIVE
GLUCOSE, UA: NEGATIVE mg/dL
HGB URINE DIPSTICK: NEGATIVE
KETONES UR: NEGATIVE mg/dL
Leukocytes, UA: NEGATIVE
NITRITE: NEGATIVE
PH: 5 (ref 5.0–8.0)
Protein, ur: NEGATIVE mg/dL
Specific Gravity, Urine: 1.013 (ref 1.005–1.030)

## 2017-07-10 LAB — CBC
HCT: 41.2 % (ref 39.0–52.0)
HEMOGLOBIN: 13.8 g/dL (ref 13.0–17.0)
MCH: 29.5 pg (ref 26.0–34.0)
MCHC: 33.5 g/dL (ref 30.0–36.0)
MCV: 88 fL (ref 78.0–100.0)
PLATELETS: 205 10*3/uL (ref 150–400)
RBC: 4.68 MIL/uL (ref 4.22–5.81)
RDW: 14.8 % (ref 11.5–15.5)
WBC: 9.2 10*3/uL (ref 4.0–10.5)

## 2017-07-10 LAB — BASIC METABOLIC PANEL
ANION GAP: 8 (ref 5–15)
BUN: 14 mg/dL (ref 6–20)
CALCIUM: 9.4 mg/dL (ref 8.9–10.3)
CO2: 26 mmol/L (ref 22–32)
Chloride: 103 mmol/L (ref 101–111)
Creatinine, Ser: 1.02 mg/dL (ref 0.61–1.24)
GLUCOSE: 103 mg/dL — AB (ref 65–99)
Potassium: 4.5 mmol/L (ref 3.5–5.1)
Sodium: 137 mmol/L (ref 135–145)

## 2017-07-10 LAB — PROTIME-INR
INR: 0.94
Prothrombin Time: 12.5 seconds (ref 11.4–15.2)

## 2017-07-10 LAB — LIPASE, BLOOD: Lipase: 68 U/L — ABNORMAL HIGH (ref 11–51)

## 2017-07-10 LAB — D-DIMER, QUANTITATIVE (NOT AT ARMC): D DIMER QUANT: 0.29 ug{FEU}/mL (ref 0.00–0.50)

## 2017-07-10 MED ORDER — OMEPRAZOLE 40 MG PO CPDR: 40.0000 mg | DELAYED_RELEASE_CAPSULE | Freq: Every day | ORAL | 1 refills | Status: DC

## 2017-07-10 MED ORDER — HYDROCODONE-ACETAMINOPHEN 5-325 MG PO TABS: 1.0000 | ORAL_TABLET | Freq: Four times a day (QID) | ORAL | 0 refills | Status: DC | PRN

## 2017-07-10 MED ORDER — ONDANSETRON 4 MG PO TBDP: 4.0000 mg | ORAL_TABLET | Freq: Three times a day (TID) | ORAL | 0 refills | Status: DC | PRN

## 2017-07-10 MED ORDER — IOPAMIDOL (ISOVUE-300) INJECTION 61%
INTRAVENOUS | Status: AC
  Administered 2017-07-10: 100 mL
  Filled 2017-07-10: qty 100

## 2017-07-10 MED ORDER — MORPHINE SULFATE (PF) 4 MG/ML IV SOLN
4.0000 mg | Freq: Once | INTRAVENOUS | Status: AC
  Administered 2017-07-10: 4 mg via INTRAVENOUS
  Filled 2017-07-10: qty 1

## 2017-07-10 MED ORDER — SODIUM CHLORIDE 0.9 % IV SOLN
INTRAVENOUS | Status: DC
  Administered 2017-07-10: 03:00:00 via INTRAVENOUS

## 2017-07-10 MED ORDER — ONDANSETRON HCL 4 MG/2ML IJ SOLN
4.0000 mg | Freq: Once | INTRAMUSCULAR | Status: AC
  Administered 2017-07-10: 4 mg via INTRAVENOUS
  Filled 2017-07-10: qty 2

## 2017-07-10 NOTE — ED Provider Notes (Signed)
TIME SEEN: 2:46 AM  CHIEF COMPLAINT: Right upper quadrant abdominal pain  HPI: Patient is a 66 year old male with history of hypertension, hyperlipidemia, peripheral vascular disease with stents in both legs on Plavix and aspirin who presents to the emergency department with right [place] abdominal pain. He describes it as feeling like "gas". He states that symptoms started around 4 PM yesterday afternoon. Reports he had a beer, [place] and soda for lunch around noon and then take a nap. Shortly after waking up from his nap he started having pain. He has had similar symptoms before and they have resolved on there and but today symptoms are not improving. Worse whenever he is lying flat and has to take a big deep breath. Denies fevers, nausea, vomiting, diarrhea. Tried Pepto-Bismol and Gas-X at home without relief. He denies to me any chest pain. No history of abdominal surgery.  ROS: See HPI Constitutional: no fever  Eyes: no drainage  ENT: no runny nose   Cardiovascular:  no chest pain  Resp: no SOB  GI: no vomiting GU: no dysuria Integumentary: no rash  Allergy: no hives  Musculoskeletal: no leg swelling  Neurological: no slurred speech ROS otherwise negative  PAST MEDICAL HISTORY/PAST SURGICAL HISTORY:  Past Medical History:  Diagnosis Date  . CAD (coronary artery disease)   . Constipation   . ED (erectile dysfunction)   . Hyperlipidemia   . Hypertension   . Pain in limb   . Peripheral vascular disease ([place])    claudication    MEDICATIONS:  Prior to Admission medications   Medication Sig Start Date End Date Taking? Authorizing Provider  acetaminophen (TYLENOL) 325 MG tablet Take 2 tablets (650 mg total) by mouth every 4 (four) hours as needed for headache or mild pain. 04/13/15   Isaiah Serge, NP  aspirin 81 MG chewable tablet Chew 1 tablet (81 mg total) by mouth daily. 05/11/15   Almyra Deforest, PA  atorvastatin (LIPITOR) 10 MG tablet Take 10 mg by mouth daily.    [provider]  cholecalciferol (VITAMIN D) 400 UNITS TABS tablet Take 400 Units by mouth daily.     [provider]  clopidogrel (PLAVIX) 75 MG tablet take 1 tablet by mouth once daily WITH BREAKFAST 03/13/17   Lorretta Harp, MD  cyclobenzaprine (FLEXERIL) 5 MG tablet Take 5 mg by mouth 2 (two) times daily as needed for muscle spasms.     [provider]  gabapentin (NEURONTIN) 100 MG capsule Take 500 mg by mouth at bedtime. 11/09/15   [provider]  magnesium oxide (MAG-OX) 400 MG tablet Take 400 mg by mouth daily. Every other day    [provider]  Multiple Vitamins-Minerals (ONE-A-DAY MENS 50+ ADVANTAGE) TABS Take 1 tablet by mouth daily.    [provider]  tamsulosin (FLOMAX) 0.4 MG CAPS capsule Take 0.4 mg by mouth at bedtime. 02/28/15   [provider]  valsartan-hydrochlorothiazide (DIOVAN-HCT) 320-25 MG per tablet Take 1 tablet by mouth daily.    [provider]    ALLERGIES:  No Known Allergies  SOCIAL HISTORY:  Social History  Substance Use Topics  . Smoking status: Former Smoker    Packs/day: 0.50    Years: 49.00    Types: Cigarettes    Quit date: 04/13/2015  . Smokeless tobacco: Never Used     Comment: Currently use Vapor  . Alcohol use 3.6 oz/week    6 Cans of beer per week     Comment:  05/10/2015 "I only drink on the weekends; down from 12 to 6 cans of beer"    FAMILY HISTORY: Family History  Problem Relation Age of Onset  . Dementia Mother   . Heart failure Mother   . Heart attack Brother     EXAM: BP (!) 149/72   Pulse (!) 58   Temp 97.9 F (36.6 C) (Oral)   Resp 14   SpO2 99%  CONSTITUTIONAL: Alert and oriented and responds appropriately to questions. Appears uncomfortable, afebrile and nontoxic HEAD: Normocephalic EYES: Conjunctivae clear, pupils appear equal, EOMI ENT: normal nose; moist mucous membranes NECK: Supple, no meningismus, no nuchal rigidity, no LAD  CARD: RRR; S1 and S2  appreciated; no murmurs, no clicks, no rubs, no gallops RESP: Normal chest excursion, patient does splint when he has to take a deep breath because of right upper quadrant abdominal pain, tachypnea; breath sounds clear and equal bilaterally; no wheezes, no rhonchi, no rales, no hypoxia or respiratory distress, speaking full sentences ABD/GI: Normal bowel sounds; non-distended; soft, tender in the right upper quadrant with a positive Murphy sign, no rebound, no guarding, no peritoneal signs, no hepatosplenomegaly BACK:  The back appears normal and is non-tender to palpation, there is no CVA tenderness EXT: Normal ROM in all joints; non-tender to palpation; no edema; normal capillary refill; no cyanosis, no calf tenderness or swelling    SKIN: Normal color for age and race; warm; no rash NEURO: Moves all extremities equally PSYCH: The patient's mood and manner are appropriate. Grooming and personal hygiene are appropriate.  MEDICAL DECISION MAKING: Patient here with right upper quadrant abdominal pain. He does not have chest pain despite nursing notes. Labs obtained in triage show negative troponin, normal electrolytes, no leukocytosis. Chest x-ray clear. I'm concerned for possible cholelithiasis, cholecystitis. Differential also includes pancreatitis, GERD, PE. Doubt ACS or dissection. Will obtain right upper quadrant ultrasound and add on LFTs, lipase and a d-dimer. EKG shows no ischemic abnormality. We'll give IV fluids, pain and nausea medicine.  ED PROGRESS: Patient's d-dimer is negative. Normal LFTs. Urine shows no blood or sign of infection. Lipase mildly elevated at 68. Ultrasound shows gallbladder sludge but no cholelithiasis, biliary dilatation, pericholecystic fluid, gallbladder wall thickening or inflammation. He is still quite tender in the right upper quadrant. Will proceed with CT scan for further evaluation. Will give second dose of morphine.  7:30 AM  CT AP shows no acute abnormality. He  has improved after his 2 doses of morphine. I suspect this is possibly biliary colic from gallbladder sludge. Will give general surgery follow-up information. We'll discharge with pain and nausea medicine. Discussed return precautions. He verbalizes understanding and is comfortable with this plan.   At this time, I do not feel there is any life-threatening condition present. I have reviewed and discussed all results (EKG, imaging, lab, urine as appropriate) and exam findings with patient/family. I have reviewed nursing notes and appropriate previous records.  I feel the patient is safe to be discharged home without further emergent workup and can continue workup as an outpatient as needed. Discussed usual and customary return precautions. Patient/family verbalize understanding and are comfortable with this plan.  Outpatient follow-up has been provided if needed. All questions have been answered.    EKG Interpretation  Date/Time:  Friday July 10 2017 01:34:52 EDT Ventricular Rate:  69 PR Interval:  128 QRS Duration: 92 QT Interval:  394 QTC Calculation: 422 R Axis:   67 Text Interpretation:  Normal sinus rhythm Incomplete right  bundle branch block Borderline ECG No significant change since last tracing Confirmed by Jerianne Anselmo, Cyril Mourning [phone]) on 07/10/2017 1:40:04 AM         Adiel Erney, Delice Bison, DO 07/10/17 0730

## 2017-07-10 NOTE — ED Notes (Signed)
MD (Ward) at bedside. 

## 2017-07-10 NOTE — ED Triage Notes (Signed)
Patient reports right lower chest pain with SOB onset 3 days ago , denies emesis or diaphoresis , he took [place] prior to arrival with no relief.

## 2017-07-24 ENCOUNTER — Other Ambulatory Visit: Payer: Self-pay | Admitting: Cardiovascular Disease

## 2017-07-24 ENCOUNTER — Ambulatory Visit (HOSPITAL_COMMUNITY)
Admission: RE | Admit: 2017-07-24 | Discharge: 2017-07-24 | Disposition: A | Discharge status: Home / Self Care | Payer: Medicare Other | Source: Ambulatory Visit | Attending: Cardiology | Admitting: Cardiology

## 2017-07-24 ENCOUNTER — Encounter (HOSPITAL_COMMUNITY): Payer: Medicare Other

## 2017-07-24 ENCOUNTER — Ambulatory Visit (HOSPITAL_BASED_OUTPATIENT_CLINIC_OR_DEPARTMENT_OTHER)
Admission: RE | Admit: 2017-07-24 | Discharge: 2017-07-24 | Disposition: A | Discharge status: Home / Self Care | Payer: Medicare Other | Source: Ambulatory Visit | Attending: Cardiology | Admitting: Cardiology

## 2017-07-24 DIAGNOSIS — I739 Peripheral vascular disease, unspecified: Secondary | ICD-10-CM | POA: Diagnosis present

## 2017-07-24 DIAGNOSIS — Z959 Presence of cardiac and vascular implant and graft, unspecified: Secondary | ICD-10-CM

## 2017-07-24 DIAGNOSIS — I10 Essential (primary) hypertension: Secondary | ICD-10-CM | POA: Diagnosis not present

## 2017-07-24 DIAGNOSIS — R9439 Abnormal result of other cardiovascular function study: Secondary | ICD-10-CM | POA: Diagnosis not present

## 2017-08-06 ENCOUNTER — Encounter (HOSPITAL_COMMUNITY): Payer: Medicare Other

## 2017-08-12 ENCOUNTER — Other Ambulatory Visit: Payer: Self-pay | Admitting: Cardiovascular Disease

## 2017-08-12 DIAGNOSIS — I739 Peripheral vascular disease, unspecified: Secondary | ICD-10-CM

## 2017-09-29 ENCOUNTER — Other Ambulatory Visit: Payer: Self-pay | Admitting: Pharmacist

## 2017-09-29 MED ORDER — PANTOPRAZOLE SODIUM 40 MG PO TBEC: 40.0000 mg | DELAYED_RELEASE_TABLET | Freq: Every day | ORAL | 4 refills | Status: DC

## 2018-03-20 ENCOUNTER — Other Ambulatory Visit: Payer: Self-pay | Admitting: Cardiovascular Disease

## 2018-04-19 ENCOUNTER — Other Ambulatory Visit: Payer: Self-pay | Admitting: Cardiovascular Disease

## 2018-05-14 ENCOUNTER — Other Ambulatory Visit: Payer: Self-pay | Admitting: Cardiovascular Disease

## 2018-06-03 ENCOUNTER — Other Ambulatory Visit: Payer: Self-pay | Admitting: Cardiovascular Disease

## 2018-06-03 DIAGNOSIS — Z95820 Peripheral vascular angioplasty status with implants and grafts: Secondary | ICD-10-CM

## 2018-06-03 DIAGNOSIS — I739 Peripheral vascular disease, unspecified: Secondary | ICD-10-CM

## 2018-06-23 ENCOUNTER — Other Ambulatory Visit: Payer: Self-pay | Admitting: Cardiovascular Disease

## 2018-07-23 ENCOUNTER — Other Ambulatory Visit: Payer: Self-pay | Admitting: Cardiovascular Disease

## 2018-07-23 NOTE — Telephone Encounter (Signed)
Rx request sent to pharmacy.  

## 2018-07-30 ENCOUNTER — Ambulatory Visit (HOSPITAL_BASED_OUTPATIENT_CLINIC_OR_DEPARTMENT_OTHER)
Admission: RE | Admit: 2018-07-30 | Discharge: 2018-07-30 | Disposition: A | Discharge status: Home / Self Care | Payer: Medicare Other | Source: Ambulatory Visit | Attending: Cardiovascular Disease | Admitting: Cardiovascular Disease

## 2018-07-30 ENCOUNTER — Other Ambulatory Visit: Payer: Self-pay | Admitting: *Deleted

## 2018-07-30 ENCOUNTER — Ambulatory Visit (HOSPITAL_COMMUNITY)
Admission: RE | Admit: 2018-07-30 | Discharge: 2018-07-30 | Disposition: A | Discharge status: Home / Self Care | Payer: Medicare Other | Source: Ambulatory Visit | Attending: Cardiovascular Disease | Admitting: Cardiovascular Disease

## 2018-07-30 DIAGNOSIS — Z95820 Peripheral vascular angioplasty status with implants and grafts: Secondary | ICD-10-CM

## 2018-07-30 DIAGNOSIS — I739 Peripheral vascular disease, unspecified: Secondary | ICD-10-CM

## 2018-08-12 ENCOUNTER — Other Ambulatory Visit: Payer: Self-pay | Admitting: Cardiovascular Disease

## 2018-08-29 ENCOUNTER — Other Ambulatory Visit: Payer: Self-pay | Admitting: Cardiovascular Disease

## 2018-09-12 ENCOUNTER — Other Ambulatory Visit: Payer: Self-pay | Admitting: Cardiovascular Disease

## 2018-09-24 ENCOUNTER — Encounter: Payer: Self-pay | Admitting: Nurse Practitioner

## 2018-09-24 ENCOUNTER — Ambulatory Visit: Payer: Medicare Other | Admitting: Nurse Practitioner

## 2018-09-24 VITALS — BP 130/78 | HR 69 | Temp 98.0°F | Ht 68.0 in | Wt 155.0 lb

## 2018-09-24 DIAGNOSIS — M25531 Pain in right wrist: Secondary | ICD-10-CM | POA: Diagnosis not present

## 2018-09-24 DIAGNOSIS — I739 Peripheral vascular disease, unspecified: Secondary | ICD-10-CM | POA: Diagnosis not present

## 2018-09-24 DIAGNOSIS — I10 Essential (primary) hypertension: Secondary | ICD-10-CM | POA: Diagnosis not present

## 2018-09-24 DIAGNOSIS — E782 Mixed hyperlipidemia: Secondary | ICD-10-CM | POA: Diagnosis not present

## 2018-09-24 NOTE — Progress Notes (Addendum)
Subjective:     Patient ID: Tyrone Walton , male    DOB: [birth year] , 67 y.o.   MRN: [id number]   Chief Complaint  Patient presents with  . Hypertension    HPI  Hypertension  This is a chronic problem. The current episode started more than 1 year ago. The problem is controlled. Pertinent negatives include no anxiety, blurred vision, chest pain or palpitations. There are no associated agents to hypertension. Risk factors for coronary artery disease include male gender and sedentary lifestyle. Past treatments include diuretics. There are no compliance problems.  There is no history of angina or kidney disease. There is no history of chronic renal disease.  Hand Pain   The incident occurred more than 1 week ago. There was no injury mechanism. The pain is present in the right wrist. The quality of the pain is described as aching. The pain does not radiate. The pain has been constant since the incident. Pertinent negatives include no chest pain, numbness or tingling. Nothing aggravates the symptoms. Treatments tried: PAIN CREAM. The treatment provided mild relief.     Past Medical History:  Diagnosis Date  . CAD (coronary artery disease)   . Constipation   . ED (erectile dysfunction)   . Hyperlipidemia   . Hypertension   . Pain in limb   . Peripheral vascular disease ([facility])    claudication     Family History  Problem Relation Age of Onset  . Dementia Mother   . Heart failure Mother   . Heart attack Brother      Current Outpatient Medications:  .  acetaminophen (TYLENOL) 325 MG tablet, Take 2 tablets (650 mg total) by mouth every 4 (four) hours as needed for headache or mild pain., Disp: , Rfl:  .  aspirin 81 MG chewable tablet, Chew 1 tablet (81 mg total) by mouth daily., Disp: , Rfl:  .  atorvastatin (LIPITOR) 10 MG tablet, Take 10 mg by mouth daily., Disp: , Rfl:  .  calcium carbonate (OSCAL) 1500 (600 Ca) MG TABS tablet, Take 600 mg of elemental calcium by mouth every other  day., Disp: , Rfl:  .  magnesium oxide (MAG-OX) 400 MG tablet, Take 400 mg by mouth daily. , Disp: , Rfl:  .  Multiple Vitamins-Minerals (ONE-A-DAY MENS 50+ ADVANTAGE) TABS, Take 1 tablet by mouth daily., Disp: , Rfl:  .  olmesartan-hydrochlorothiazide (BENICAR HCT) 20-12.5 MG tablet, Take 1 tablet by mouth daily., Disp: , Rfl:    No Known Allergies   Review of Systems  Eyes: Negative.  Negative for blurred vision.  Respiratory: Negative.   Cardiovascular: Negative for chest pain, palpitations and leg swelling.  Musculoskeletal: Negative.   Skin: Negative.   Neurological: Negative for tingling and numbness.     Today's Vitals   09/24/18 0912  BP: 130/78  Pulse: 69  Temp: 98 F (36.7 C)  TempSrc: Oral  SpO2: 98%  Weight: 155 lb (70.3 kg)  Height: '5\' 8"'  (1.727 m)   Body mass index is 23.57 kg/m.   Objective:  Physical Exam Constitutional:      Appearance: Normal appearance.  Cardiovascular:     Rate and Rhythm: Normal rate.     Pulses: Normal pulses.     Heart sounds: Normal heart sounds.  Pulmonary:     Effort: Pulmonary effort is normal.     Breath sounds: Normal breath sounds.  Musculoskeletal: Normal range of motion.        General: Tenderness present. No swelling.  Skin:    General: Skin is warm and dry.     Capillary Refill: Capillary refill takes less than 2 seconds.  Neurological:     General: No focal deficit present.     Mental Status: He is alert and oriented to person, place, and time.  Psychiatric:        Mood and Affect: Mood normal.         Assessment And Plan:     1. Essential hypertension . B/P is controlled.  Marland Kitchen BMP ordered to check renal function.  . The importance of regular exercise and dietary modification was stressed to the patient.  . Stressed importance of losing ten percent of her body weight to help with B/P control. T - BMP8+eGFR  2. Peripheral vascular disease ([place])  Chronic  Has been on plavix but has been getting lower  amounts of medication, advised to contact Dr. Gwenlyn Found to see if he needs to continue the plavix  3. Mixed hyperlipidemia  Chronic, controlled  Continue with current medications - Lipid panel  4. Right wrist pain  New onset pain, describes as aching.  Used pain cream with minimal relief and will continue to use if worsens will consider imaging    [name], FNP

## 2018-09-24 NOTE — Patient Instructions (Signed)
Peripheral Vascular Disease Peripheral vascular disease (PVD) is a disease of the blood vessels that are not part of your heart and brain. A simple term for PVD is poor circulation. In most cases, PVD narrows the blood vessels that carry blood from your heart to the rest of your body. This can result in a decreased supply of blood to your arms, legs, and internal organs, like your stomach or kidneys. However, it most often affects a person's lower legs and feet. There are two types of PVD.  Organic PVD. This is the more common type. It is caused by damage to the structure of blood vessels.  Functional PVD. This is caused by conditions that make blood vessels contract and tighten (spasm).  Without treatment, PVD tends to get worse over time. PVD can also lead to acute ischemic limb. This is when an arm or limb suddenly has trouble getting enough blood. This is a medical emergency. Follow these instructions at home:  Take medicines only as told by your doctor.  Do not use any tobacco products, including cigarettes, chewing tobacco, or electronic cigarettes. If you need help quitting, ask your doctor.  Lose weight if you are overweight, and maintain a healthy weight as told by your doctor.  Eat a diet that is low in fat and cholesterol. If you need help, ask your doctor.  Exercise regularly. Ask your doctor for some good activities for you.  Take good care of your feet. ? Wear comfortable shoes that fit well. ? Check your feet often for any cuts or sores. Contact a doctor if:  You have cramps in your legs while walking.  You have leg pain when you are at rest.  You have coldness in a leg or foot.  Your skin changes.  You are unable to get or have an erection (erectile dysfunction).  You have cuts or sores on your feet that are not healing. Get help right away if:  Your arm or leg turns cold and blue.  Your arms or legs become red, warm, swollen, painful, or numb.  You have  chest pain or trouble breathing.  You suddenly have weakness in your face, arm, or leg.  You become very confused or you cannot speak.  You suddenly have a very bad headache.  You suddenly cannot see. This information is not intended to replace advice given to you by your health care provider. Make sure you discuss any questions you have with your health care provider. Document Released: 12/24/2009 Document Revised: 03/06/2016 Document Reviewed: 03/09/2014 Elsevier Interactive Patient Education  2017 [employer]     Hypertension Hypertension is another name for high blood pressure. High blood pressure forces your heart to work harder to pump blood. This can cause problems over time. There are two numbers in a blood pressure reading. There is a top number (systolic) over a bottom number (diastolic). It is best to have a blood pressure below 120/80. Healthy choices can help lower your blood pressure. You may need medicine to help lower your blood pressure if:  Your blood pressure cannot be lowered with healthy choices.  Your blood pressure is higher than 130/80.  Follow these instructions at home: Eating and drinking  If directed, follow the DASH eating plan. This diet includes: ? Filling half of your plate at each meal with fruits and vegetables. ? Filling one quarter of your plate at each meal with whole grains. Whole grains include whole wheat pasta, brown rice, and whole grain bread. ?   Eating or drinking low-fat dairy products, such as skim milk or low-fat yogurt. ? Filling one quarter of your plate at each meal with low-fat (lean) proteins. Low-fat proteins include fish, skinless chicken, eggs, beans, and tofu. ? Avoiding fatty meat, cured and processed meat, or chicken with skin. ? Avoiding premade or processed food.  Eat less than 1,500 mg of salt (sodium) a day.  Limit alcohol use to no more than 1 drink a day for nonpregnant women and 2 drinks a day for men. One drink  equals 12 oz of beer, 5 oz of wine, or 1 oz of hard liquor. Lifestyle  Work with your doctor to stay at a healthy weight or to lose weight. Ask your doctor what the best weight is for you.  Get at least 30 minutes of exercise that causes your heart to beat faster (aerobic exercise) most days of the week. This may include walking, swimming, or biking.  Get at least 30 minutes of exercise that strengthens your muscles (resistance exercise) at least 3 days a week. This may include lifting weights or pilates.  Do not use any products that contain nicotine or tobacco. This includes cigarettes and e-cigarettes. If you need help quitting, ask your doctor.  Check your blood pressure at home as told by your doctor.  Keep all follow-up visits as told by your doctor. This is important. Medicines  Take over-the-counter and prescription medicines only as told by your doctor. Follow directions carefully.  Do not skip doses of blood pressure medicine. The medicine does not work as well if you skip doses. Skipping doses also puts you at risk for problems.  Ask your doctor about side effects or reactions to medicines that you should watch for. Contact a doctor if:  You think you are having a reaction to the medicine you are taking.  You have headaches that keep coming back (recurring).  You feel dizzy.  You have swelling in your ankles.  You have trouble with your vision. Get help right away if:  You get a very bad headache.  You start to feel confused.  You feel weak or numb.  You feel faint.  You get very bad pain in your: ? Chest. ? Belly (abdomen).  You throw up (vomit) more than once.  You have trouble breathing. Summary  Hypertension is another name for high blood pressure.  Making healthy choices can help lower blood pressure. If your blood pressure cannot be controlled with healthy choices, you may need to take medicine. This information is not intended to replace advice  given to you by your health care provider. Make sure you discuss any questions you have with your health care provider. Document Released: 03/17/2008 Document Revised: 08/27/2016 Document Reviewed: 08/27/2016 Elsevier Interactive Patient Education  2018 Else[employer] Inc.  

## 2018-09-25 ENCOUNTER — Other Ambulatory Visit: Payer: Self-pay | Admitting: Cardiovascular Disease

## 2018-09-25 LAB — BMP8+EGFR
BUN / CREAT RATIO: 17 (ref 10–24)
BUN: 17 mg/dL (ref 8–27)
CO2: 23 mmol/L (ref 20–29)
Calcium: 9.7 mg/dL (ref 8.6–10.2)
Chloride: 104 mmol/L (ref 96–106)
Creatinine, Ser: 1.01 mg/dL (ref 0.76–1.27)
GFR, EST AFRICAN AMERICAN: 89 mL/min/{1.73_m2} (ref >59)
GFR, EST NON AFRICAN AMERICAN: 77 mL/min/{1.73_m2} (ref >59)
Glucose: 79 mg/dL (ref 65–99)
POTASSIUM: 4.7 mmol/L (ref 3.5–5.2)
Sodium: 141 mmol/L (ref 134–144)

## 2018-09-25 LAB — LIPID PANEL
CHOL/HDL RATIO: 2.8 ratio (ref 0.0–5.0)
Cholesterol, Total: 216 mg/dL — ABNORMAL HIGH (ref 100–199)
HDL: 76 mg/dL (ref >39)
LDL Calculated: 123 mg/dL — ABNORMAL HIGH (ref 0–99)
Triglycerides: 85 mg/dL (ref 0–149)
VLDL CHOLESTEROL CAL: 17 mg/dL (ref 5–40)

## 2018-09-27 ENCOUNTER — Telehealth: Payer: Self-pay

## 2018-09-27 ENCOUNTER — Telehealth: Payer: Self-pay | Admitting: Cardiovascular Disease

## 2018-09-27 MED ORDER — CLOPIDOGREL BISULFATE 75 MG PO TABS: 75.0000 mg | ORAL_TABLET | Freq: Every day | ORAL | 0 refills | Status: DC

## 2018-09-27 NOTE — Telephone Encounter (Signed)
No message needed °

## 2018-09-27 NOTE — Telephone Encounter (Signed)
Received a call from patient he stated he has been having trouble refilling plavix.Advised he is past due to see Dr.Berry.Appointment scheduled with Dr.Berry 10/14/18 at 10:45 am.Plavix refill sent to pharmacy.

## 2018-10-14 ENCOUNTER — Ambulatory Visit: Payer: Medicare Other | Admitting: Cardiovascular Disease

## 2018-10-29 ENCOUNTER — Other Ambulatory Visit: Payer: Self-pay | Admitting: Cardiovascular Disease

## 2018-11-01 ENCOUNTER — Other Ambulatory Visit: Payer: Self-pay | Admitting: Nurse Practitioner

## 2018-11-16 ENCOUNTER — Encounter: Payer: Self-pay | Admitting: Cardiovascular Disease

## 2018-11-16 ENCOUNTER — Ambulatory Visit: Payer: Medicare Other | Admitting: Cardiovascular Disease

## 2018-11-16 VITALS — BP 112/70 | HR 58 | Ht 68.0 in | Wt 159.2 lb

## 2018-11-16 DIAGNOSIS — E782 Mixed hyperlipidemia: Secondary | ICD-10-CM

## 2018-11-16 DIAGNOSIS — I10 Essential (primary) hypertension: Secondary | ICD-10-CM | POA: Diagnosis not present

## 2018-11-16 DIAGNOSIS — I739 Peripheral vascular disease, unspecified: Secondary | ICD-10-CM

## 2018-11-16 DIAGNOSIS — Z720 Tobacco use: Secondary | ICD-10-CM | POA: Diagnosis not present

## 2018-11-16 MED ORDER — ATORVASTATIN CALCIUM 40 MG PO TABS: 40.0000 mg | ORAL_TABLET | Freq: Every day | ORAL | 3 refills | Status: DC

## 2018-11-16 MED ORDER — CLOPIDOGREL BISULFATE 75 MG PO TABS: ORAL_TABLET | ORAL | 3 refills | Status: DC

## 2018-11-16 NOTE — Assessment & Plan Note (Signed)
History of hyperlipidemia on low-dose statin therapy with lipid profile performed 09/24/2018 revealing total cholesterol 216, LDL 123 and HDL of 76.  He does admit to dietary indiscretion.  I am going to increase his Lipitor from 10 to 40 mg a day and we will recheck a lipid liver profile in 3 months.

## 2018-11-16 NOTE — Progress Notes (Signed)
11/16/2018 Tyrone Walton   1950/12/04  [id number]  Primary Physician [name], FNP Primary Cardiologist: Lorretta Harp MD Lupe [place], [place]  HPI:  Tyrone Walton is a 68 y.o.  married African-American male who by his wife today. He was referred by Dr. Melony Overly , from [facility] podiatry, for peripheral vascular evaluation. I last saw him in the office 01/08/2016.He has a history of treated hypertension and hyperlipidemia. He has seen Dr. Kellie Simmering in the past for peripheral evaluation because of claudication but Dopplers were unrevealing. He's had a negative Myoview stress test performed 06/02/13. His cardiac risk factors include treated hypertension, hyponatremia and tobacco abuse smoking one pack per day. His brother died of a myocardial infarction at age 30. He has never had a heart attack or stroke and denies chest pain or shortness of breath. He does complain of right greater than left lower extremity claudication which is lifestyle limiting of less than 100 yards. He has an abnormal right fifth toe nail and was scheduled to have this evaluated by Dr. Melony Overly did not feel good peripheral pulses and referred him for further evaluation. Lower extremity arterial Doppler studies in our office confirmed that he had occluded SFAs bilaterally. Based on this, it was decided to proceed with angiography and potential intervention for lifestyle limiting claudication. This occurred on 04/12/15. I did end up spending his left external iliac artery reducing an 80% stenosis to 0% residual. He long segmentally occluded calcified left SFA which I was unable to cross. Since discharge his left lower external claudication has significantly improved. He had residual right lower; occasion after his left external iliac artery intervention. On 05/10/15 I re-angiogram him with the intent of fixing his right SFA however, I discovered significant disease in the entire right external iliac artery which I stented. This  resulted in marked improvement in his claudication and increase in his right ABI from 0.6 1.--->>> 71. He no longer is limited by claudication and is able to perform his job without limitation. He has stopped smoking since I last saw him.  Since I saw him 3 years ago he continues to do well.  He denies significant claudication, chest pain or shortness of breath.  He has had a lipid profile that shows that his LDL is not at goal on low-dose statin therapy.  Does admit to dietary indiscretion.  Recent Doppler studies performed 07/30/2018 suggest continued patency of his iliac stents.   Current Meds  Medication Sig  . acetaminophen (TYLENOL) 325 MG tablet Take 2 tablets (650 mg total) by mouth every 4 (four) hours as needed for headache or mild pain.  Marland Kitchen aspirin 81 MG chewable tablet Chew 1 tablet (81 mg total) by mouth daily.  Marland Kitchen atorvastatin (LIPITOR) 10 MG tablet TAKE 1 TABLET BY MOUTH ONCE DAILY  . calcium carbonate (OSCAL) 1500 (600 Ca) MG TABS tablet Take 600 mg of elemental calcium by mouth every other day.  . clopidogrel (PLAVIX) 75 MG tablet TAKE 1 TABLET(75 MG) BY MOUTH DAILY  . magnesium oxide (MAG-OX) 400 MG tablet Take 400 mg by mouth daily.   . Multiple Vitamins-Minerals (ONE-A-DAY MENS 50+ ADVANTAGE) TABS Take 1 tablet by mouth daily.  Marland Kitchen olmesartan-hydrochlorothiazide (BENICAR HCT) 20-12.5 MG tablet Take 1 tablet by mouth daily.     No Known Allergies  Social History   Socioeconomic History  . Marital status: Married    Spouse name: Not on file  . Number of children: Not on file  .  Years of education: Not on file  . Highest education level: Not on file  Occupational History  . Not on file  Social Needs  . Financial resource strain: Not on file  . Food insecurity:    Worry: Not on file    Inability: Not on file  . Transportation needs:    Medical: Not on file    Non-medical: Not on file  Tobacco Use  . Smoking status: Former Smoker    Packs/day: 0.50    Years: 49.00      Pack years: 24.50    Types: Cigarettes    Last attempt to quit: 04/13/2015    Years since quitting: 3.5  . Smokeless tobacco: Never Used  . Tobacco comment: Currently use Vapor  Substance and Sexual Activity  . Alcohol use: Yes    Alcohol/week: 6.0 standard drinks    Types: 6 Cans of beer per week    Comment: 05/10/2015 "I only drink on the weekends; down from 12 to 6 cans of beer"  . Drug use: No  . Sexual activity: Yes  Lifestyle  . Physical activity:    Days per week: Not on file    Minutes per session: Not on file  . Stress: Not on file  Relationships  . Social connections:    Talks on phone: Not on file    Gets together: Not on file    Attends religious service: Not on file    Active member of club or organization: Not on file    Attends meetings of clubs or organizations: Not on file    Relationship status: Not on file  . Intimate partner violence:    Fear of current or ex partner: Not on file    Emotionally abused: Not on file    Physically abused: Not on file    Forced sexual activity: Not on file  Other Topics Concern  . Not on file  Social History Narrative  . Not on file     Review of Systems: General: negative for chills, fever, night sweats or weight changes.  Cardiovascular: negative for chest pain, dyspnea on exertion, edema, orthopnea, palpitations, paroxysmal nocturnal dyspnea or shortness of breath Dermatological: negative for rash Respiratory: negative for cough or wheezing Urologic: negative for hematuria Abdominal: negative for nausea, vomiting, diarrhea, bright red blood per rectum, melena, or hematemesis Neurologic: negative for visual changes, syncope, or dizziness All other systems reviewed and are otherwise negative except as noted above.    Blood pressure 112/70, pulse (!) 58, height 5\' 8"  (1.727 m), weight 159 lb 3.2 oz (72.2 kg).  General appearance: alert and no distress Neck: no adenopathy, no carotid bruit, no JVD, supple,  symmetrical, trachea midline and thyroid not enlarged, symmetric, no tenderness/mass/nodules Lungs: clear to auscultation bilaterally Heart: regular rate and rhythm, S1, S2 normal, no murmur, click, rub or gallop Extremities: extremities normal, atraumatic, no cyanosis or edema Pulses: Diminished pedal pulses bilaterally Skin: Skin color, texture, turgor normal. No rashes or lesions Neurologic: Alert and oriented X 3, normal strength and tone. Normal symmetric reflexes. Normal coordination and gait  EKG sinus bradycardia 58 without ST or T wave changes.  I personally reviewed this EKG.  ASSESSMENT AND PLAN:   Peripheral vascular disease ([place]) History of peripheral arterial disease status post bilateral iliac stenting in June and July 2016 with known occluded SFAs bilaterally.  To that his claudication significantly improved.  His most recent Dopplers performed 07/30/2018 revealed his stents to be patent.  He is  on aspirin and Plavix.  Essential hypertension History of essential hypertension her blood pressure measured at 112/70.  He is on olmesartan and hydrochlorothiazide.  Hyperlipidemia History of hyperlipidemia on low-dose statin therapy with lipid profile performed 09/24/2018 revealing total cholesterol 216, LDL 123 and HDL of 76.  He does admit to dietary indiscretion.  I am going to increase his Lipitor from 10 to 40 mg a day and we will recheck a lipid liver profile in 3 months.  Tobacco abuse History of discontinue tobacco abuse.  He does vape however.      Lorretta Harp MD FACP,FACC,FAHA, [facility] 11/16/2018 3:54 PM

## 2018-11-16 NOTE — Assessment & Plan Note (Signed)
History of essential hypertension her blood pressure measured at 112/70.  He is on olmesartan and hydrochlorothiazide.

## 2018-11-16 NOTE — Patient Instructions (Addendum)
Medication Instructions:  Your physician has recommended you make the following change in your medication:  INCREASE YOUR ATORVASTATIN TO 40 MG, ONE TABLET BY MOUTH DAILY  If you need a refill on your cardiac medications before your next appointment, please call your pharmacy.   Lab work: Your physician recommends that you return for lab work in: 3 MONTHS ([place])  If you have labs (blood work) drawn today and your tests are completely normal, you will receive your results only by: Marland Kitchen MyChart Message (if you have MyChart) OR . A paper copy in the mail If you have any lab test that is abnormal or we need to change your treatment, we will call you to review the results.  Testing/Procedures: NONE  Follow-Up: At [facility], you and your health needs are our priority.  As part of our continuing mission to provide you with exceptional heart care, we have created designated Provider Care Teams.  These Care Teams include your primary Cardiologist (physician) and Advanced Practice Providers (APPs -  Physician Assistants and Nurse Practitioners) who all work together to provide you with the care you need, when you need it. . You will need a follow up appointment in 12 months.  Please call our office 2 months in advance to schedule this appointment.  You may see Dr. Gwenlyn Found or one of the following Advanced Practice Providers on your designated Care Team:   . Kerin Ransom, [place] . Almyra Deforest, PA-C . Fabian Sharp, PA-C . Jory Sims, DNP . Rosaria Ferries, PA-C . Roby Lofts, PA-C . Sande Rives, PA-C  Any Other Special Instructions Will Be Listed Below (If Applicable). BELOW ARE HEART HEALTHY DIET RECOMMENDATIONS   Heart-Healthy Eating Plan Many factors influence your heart (coronary) health, including eating and exercise habits. Coronary risk increases with abnormal blood fat (lipid) levels. Heart-healthy meal planning includes limiting unhealthy fats, increasing healthy fats,  and making other diet and lifestyle changes. What is my plan? Your health care provider may recommend that you:  Limit your fat intake to _________% or less of your total calories each day.  Limit your saturated fat intake to _________% or less of your total calories each day.  Limit the amount of cholesterol in your diet to less than _________ mg per day. What are tips for following this plan? Cooking Cook foods using methods other than frying. Baking, boiling, grilling, and broiling are all good options. Other ways to reduce fat include:  Removing the skin from poultry.  Removing all visible fats from meats.  Steaming vegetables in water or broth. Meal planning   At meals, imagine dividing your plate into fourths: ? Fill one-half of your plate with vegetables and green salads. ? Fill one-fourth of your plate with whole grains. ? Fill one-fourth of your plate with lean protein foods.  Eat 4-5 servings of vegetables per day. One serving equals 1 cup raw or cooked vegetable, or 2 cups raw leafy greens.  Eat 4-5 servings of fruit per day. One serving equals 1 medium whole fruit,  cup dried fruit,  cup fresh, frozen, or canned fruit, or  cup 100% fruit juice.  Eat more foods that contain soluble fiber. Examples include apples, broccoli, carrots, beans, peas, and barley. Aim to get 25-30 g of fiber per day.  Increase your consumption of legumes, nuts, and seeds to 4-5 servings per week. One serving of dried beans or legumes equals  cup cooked, 1 serving of nuts is  cup, and 1 serving of  seeds equals 1 tablespoon. Fats  Choose healthy fats more often. Choose monounsaturated and polyunsaturated fats, such as olive and canola oils, flaxseeds, walnuts, almonds, and seeds.  Eat more omega-3 fats. Choose salmon, mackerel, sardines, tuna, flaxseed oil, and ground flaxseeds. Aim to eat fish at least 2 times each week.  Check food labels carefully to identify foods with trans fats or  high amounts of saturated fat.  Limit saturated fats. These are found in animal products, such as meats, butter, and cream. Plant sources of saturated fats include palm oil, palm kernel oil, and coconut oil.  Avoid foods with partially hydrogenated oils in them. These contain trans fats. Examples are stick margarine, some tub margarines, cookies, crackers, and other baked goods.  Avoid fried foods. General information  Eat more home-cooked food and less restaurant, buffet, and fast food.  Limit or avoid alcohol.  Limit foods that are high in starch and sugar.  Lose weight if you are overweight. Losing just 5-10% of your body weight can help your overall health and prevent diseases such as diabetes and heart disease.  Monitor your salt (sodium) intake, especially if you have high blood pressure. Talk with your health care provider about your sodium intake.  Try to incorporate more vegetarian meals weekly. What foods can I eat? Fruits All fresh, canned (in natural juice), or frozen fruits. Vegetables Fresh or frozen vegetables (raw, steamed, roasted, or grilled). Green salads. Grains Most grains. Choose whole wheat and whole grains most of the time. Rice and pasta, including brown rice and pastas made with whole wheat. Meats and other proteins Lean, well-trimmed beef, veal, pork, and lamb. Chicken and [place] without skin. All fish and shellfish. Wild duck, rabbit, pheasant, and venison. Egg whites or low-cholesterol egg substitutes. Dried beans, peas, lentils, and tofu. Seeds and most nuts. Dairy Low-fat or nonfat cheeses, including ricotta and mozzarella. Skim or 1% milk (liquid, powdered, or evaporated). Buttermilk made with low-fat milk. Nonfat or low-fat yogurt. Fats and oils Non-hydrogenated (trans-free) margarines. Vegetable oils, including soybean, sesame, sunflower, olive, peanut, safflower, corn, canola, and cottonseed. Salad dressings or mayonnaise made with a vegetable  oil. Beverages Water (mineral or sparkling). Coffee and tea. Diet carbonated beverages. Sweets and desserts Sherbet, gelatin, and fruit ice. Small amounts of dark chocolate. Limit all sweets and desserts. Seasonings and condiments All seasonings and condiments. The items listed above may not be a complete list of foods and beverages you can eat. Contact a dietitian for more options. What foods are not recommended? Fruits Canned fruit in heavy syrup. Fruit in cream or butter sauce. Fried fruit. Limit coconut. Vegetables Vegetables cooked in cheese, cream, or butter sauce. Fried vegetables. Grains Breads made with saturated or trans fats, oils, or whole milk. Croissants. Sweet rolls. Donuts. High-fat crackers, such as cheese crackers. Meats and other proteins Fatty meats, such as hot dogs, ribs, sausage, bacon, rib-eye roast or steak. High-fat deli meats, such as salami and bologna. Caviar. Domestic duck and goose. Organ meats, such as liver. Dairy Cream, sour cream, cream cheese, and creamed cottage cheese. Whole milk cheeses. Whole or 2% milk (liquid, evaporated, or condensed). Whole buttermilk. Cream sauce or high-fat cheese sauce. Whole-milk yogurt. Fats and oils Meat fat, or shortening. Cocoa butter, hydrogenated oils, palm oil, coconut oil, palm kernel oil. Solid fats and shortenings, including bacon fat, salt pork, lard, and butter. Nondairy cream substitutes. Salad dressings with cheese or sour cream. Beverages Regular sodas and any drinks with added sugar. Sweets and desserts Frosting. Pudding.  Cookies. Cakes. Pies. Milk chocolate or white chocolate. Buttered syrups. Full-fat ice cream or ice cream drinks. The items listed above may not be a complete list of foods and beverages to avoid. Contact a dietitian for more information. Summary  Heart-healthy meal planning includes limiting unhealthy fats, increasing healthy fats, and making other diet and lifestyle changes.  Lose  weight if you are overweight. Losing just 5-10% of your body weight can help your overall health and prevent diseases such as diabetes and heart disease.  Focus on eating a balance of foods, including fruits and vegetables, low-fat or nonfat dairy, lean protein, nuts and legumes, whole grains, and heart-healthy oils and fats. This information is not intended to replace advice given to you by your health care provider. Make sure you discuss any questions you have with your health care provider. Document Released: 07/08/2008 Document Revised: 11/06/2017 Document Reviewed: 11/06/2017 Elsevier Interactive Patient Education  2019 [employer].

## 2018-11-16 NOTE — Assessment & Plan Note (Signed)
History of discontinue tobacco abuse.  He does vape however.

## 2018-11-16 NOTE — Assessment & Plan Note (Signed)
History of peripheral arterial disease status post bilateral iliac stenting in June and July 2016 with known occluded SFAs bilaterally.  To that his claudication significantly improved.  His most recent Dopplers performed 07/30/2018 revealed his stents to be patent.  He is on aspirin and Plavix.

## 2018-11-25 ENCOUNTER — Other Ambulatory Visit: Payer: Self-pay | Admitting: Nurse Practitioner

## 2019-02-03 ENCOUNTER — Ambulatory Visit: Payer: Self-pay | Admitting: Internal Medicine

## 2019-02-03 ENCOUNTER — Ambulatory Visit: Payer: Medicare Other

## 2019-02-03 ENCOUNTER — Other Ambulatory Visit: Payer: Self-pay | Admitting: Nurse Practitioner

## 2019-02-03 ENCOUNTER — Ambulatory Visit (INDEPENDENT_AMBULATORY_CARE_PROVIDER_SITE_OTHER): Payer: Medicare Other | Admitting: Nurse Practitioner

## 2019-02-03 ENCOUNTER — Other Ambulatory Visit: Payer: Self-pay

## 2019-02-03 ENCOUNTER — Encounter: Payer: Self-pay | Admitting: Nurse Practitioner

## 2019-02-03 VITALS — BP 142/78 | HR 75 | Temp 97.6°F | Ht 68.0 in | Wt 158.8 lb

## 2019-02-03 VITALS — BP 142/78 | HR 75 | Temp 97.6°F | Ht 68.0 in | Wt 158.0 lb

## 2019-02-03 DIAGNOSIS — Z0000 Encounter for general adult medical examination without abnormal findings: Secondary | ICD-10-CM

## 2019-02-03 DIAGNOSIS — G8929 Other chronic pain: Secondary | ICD-10-CM | POA: Diagnosis not present

## 2019-02-03 DIAGNOSIS — E782 Mixed hyperlipidemia: Secondary | ICD-10-CM

## 2019-02-03 DIAGNOSIS — I10 Essential (primary) hypertension: Secondary | ICD-10-CM | POA: Diagnosis not present

## 2019-02-03 DIAGNOSIS — S29012A Strain of muscle and tendon of back wall of thorax, initial encounter: Secondary | ICD-10-CM

## 2019-02-03 DIAGNOSIS — M25512 Pain in left shoulder: Secondary | ICD-10-CM

## 2019-02-03 DIAGNOSIS — T148XXA Other injury of unspecified body region, initial encounter: Secondary | ICD-10-CM

## 2019-02-03 LAB — POCT UA - MICROALBUMIN
Albumin/Creatinine Ratio, Urine, POC: <30
Creatinine, POC: 200 mg/dL
Microalbumin Ur, POC: 10 mg/L

## 2019-02-03 LAB — POCT URINALYSIS DIPSTICK
Bilirubin, UA: NEGATIVE
Glucose, UA: NEGATIVE
Ketones, UA: NEGATIVE
Leukocytes, UA: NEGATIVE
Nitrite, UA: NEGATIVE
Protein, UA: NEGATIVE
Spec Grav, UA: 1.01 (ref 1.010–1.025)
Urobilinogen, UA: 0.2 E.U./dL
pH, UA: 6 (ref 5.0–8.0)

## 2019-02-03 MED ORDER — DICLOFENAC SODIUM 1 % TD GEL: 2.0000 g | Freq: Four times a day (QID) | TRANSDERMAL | 3 refills | Status: DC

## 2019-02-03 NOTE — Patient Instructions (Signed)
Tyrone Walton , Thank you for taking time to come for your Medicare Wellness Visit. I appreciate your ongoing commitment to your health goals. Please review the following plan we discussed and let me know if I can assist you in the future.   Screening recommendations/referrals: Colonoscopy: 2017 per patient Recommended yearly ophthalmology/optometry visit for glaucoma screening and checkup Recommended yearly dental visit for hygiene and checkup  Vaccinations: Influenza vaccine: 07/2018 Pneumococcal vaccine: 05/2016 Tdap vaccine: 03/2016 Shingles vaccine: 01/2017    Advanced directives: Advance directive discussed with you today. Even though you declined this today please call our office should you change your mind and we can give you the proper paperwork for you to fill out.   Conditions/risks identified: HTN  Next appointment: 06/09/2019 8:30  Preventive Care 11 Years and Older, Male Preventive care refers to lifestyle choices and visits with your health care provider that can promote health and wellness. What does preventive care include?  A yearly physical exam. This is also called an annual well check.  Dental exams once or twice a year.  Routine eye exams. Ask your health care provider how often you should have your eyes checked.  Personal lifestyle choices, including:  Daily care of your teeth and gums.  Regular physical activity.  Eating a healthy diet.  Avoiding tobacco and drug use.  Limiting alcohol use.  Practicing safe sex.  Taking low doses of aspirin every day.  Taking vitamin and mineral supplements as recommended by your health care provider. What happens during an annual well check? The services and screenings done by your health care provider during your annual well check will depend on your age, overall health, lifestyle risk factors, and family history of disease. Counseling  Your health care provider may ask you questions about your:  Alcohol use.   Tobacco use.  Drug use.  Emotional well-being.  Home and relationship well-being.  Sexual activity.  Eating habits.  History of falls.  Memory and ability to understand (cognition).  Work and work Statistician. Screening  You may have the following tests or measurements:  Height, weight, and BMI.  Blood pressure.  Lipid and cholesterol levels. These may be checked every 5 years, or more frequently if you are over 67 years old.  Skin check.  Lung cancer screening. You may have this screening every year starting at age 11 if you have a 30-pack-year history of smoking and currently smoke or have quit within the past 15 years.  Fecal occult blood test (FOBT) of the stool. You may have this test every year starting at age 9.  Flexible sigmoidoscopy or colonoscopy. You may have a sigmoidoscopy every 5 years or a colonoscopy every 10 years starting at age 18.  Prostate cancer screening. Recommendations will vary depending on your family history and other risks.  Hepatitis C blood test.  Hepatitis B blood test.  Sexually transmitted disease (STD) testing.  Diabetes screening. This is done by checking your blood sugar (glucose) after you have not eaten for a while (fasting). You may have this done every 1-3 years.  Abdominal aortic aneurysm (AAA) screening. You may need this if you are a current or former smoker.  Osteoporosis. You may be screened starting at age 5 if you are at high risk. Talk with your health care provider about your test results, treatment options, and if necessary, the need for more tests. Vaccines  Your health care provider may recommend certain vaccines, such as:  Influenza vaccine. This is recommended every  year.  Tetanus, diphtheria, and acellular pertussis (Tdap, Td) vaccine. You may need a Td booster every 10 years.  Zoster vaccine. You may need this after age 10.  Pneumococcal 13-valent conjugate (PCV13) vaccine. One dose is recommended  after age 61.  Pneumococcal polysaccharide (PPSV23) vaccine. One dose is recommended after age 53. Talk to your health care provider about which screenings and vaccines you need and how often you need them. This information is not intended to replace advice given to you by your health care provider. Make sure you discuss any questions you have with your health care provider. Document Released: 10/26/2015 Document Revised: 06/18/2016 Document Reviewed: 07/31/2015 Elsevier Interactive Patient Education  2017 [place] Prevention in the Home Falls can cause injuries. They can happen to people of all ages. There are many things you can do to make your home safe and to help prevent falls. What can I do on the outside of my home?  Regularly fix the edges of walkways and driveways and fix any cracks.  Remove anything that might make you trip as you walk through a door, such as a raised step or threshold.  Trim any bushes or trees on the path to your home.  Use bright outdoor lighting.  Clear any walking paths of anything that might make someone trip, such as rocks or tools.  Regularly check to see if handrails are loose or broken. Make sure that both sides of any steps have handrails.  Any raised decks and porches should have guardrails on the edges.  Have any leaves, snow, or ice cleared regularly.  Use sand or salt on walking paths during winter.  Clean up any spills in your garage right away. This includes oil or grease spills. What can I do in the bathroom?  Use night lights.  Install grab bars by the toilet and in the tub and shower. Do not use towel bars as grab bars.  Use non-skid mats or decals in the tub or shower.  If you need to sit down in the shower, use a plastic, non-slip stool.  Keep the floor dry. Clean up any water that spills on the floor as soon as it happens.  Remove soap buildup in the tub or shower regularly.  Attach bath mats securely with  double-sided non-slip rug tape.  Do not have throw rugs and other things on the floor that can make you trip. What can I do in the bedroom?  Use night lights.  Make sure that you have a light by your bed that is easy to reach.  Do not use any sheets or blankets that are too big for your bed. They should not hang down onto the floor.  Have a firm chair that has side arms. You can use this for support while you get dressed.  Do not have throw rugs and other things on the floor that can make you trip. What can I do in the kitchen?  Clean up any spills right away.  Avoid walking on wet floors.  Keep items that you use a lot in easy-to-reach places.  If you need to reach something above you, use a strong step stool that has a grab bar.  Keep electrical cords out of the way.  Do not use floor polish or wax that makes floors slippery. If you must use wax, use non-skid floor wax.  Do not have throw rugs and other things on the floor that can make you trip. What can  I do with my stairs?  Do not leave any items on the stairs.  Make sure that there are handrails on both sides of the stairs and use them. Fix handrails that are broken or loose. Make sure that handrails are as long as the stairways.  Check any carpeting to make sure that it is firmly attached to the stairs. Fix any carpet that is loose or worn.  Avoid having throw rugs at the top or bottom of the stairs. If you do have throw rugs, attach them to the floor with carpet tape.  Make sure that you have a light switch at the top of the stairs and the bottom of the stairs. If you do not have them, ask someone to add them for you. What else can I do to help prevent falls?  Wear shoes that:  Do not have high heels.  Have rubber bottoms.  Are comfortable and fit you well.  Are closed at the toe. Do not wear sandals.  If you use a stepladder:  Make sure that it is fully opened. Do not climb a closed stepladder.  Make  sure that both sides of the stepladder are locked into place.  Ask someone to hold it for you, if possible.  Clearly mark and make sure that you can see:  Any grab bars or handrails.  First and last steps.  Where the edge of each step is.  Use tools that help you move around (mobility aids) if they are needed. These include:  Canes.  Walkers.  Scooters.  Crutches.  Turn on the lights when you go into a dark area. Replace any light bulbs as soon as they burn out.  Set up your furniture so you have a clear path. Avoid moving your furniture around.  If any of your floors are uneven, fix them.  If there are any pets around you, be aware of where they are.  Review your medicines with your doctor. Some medicines can make you feel dizzy. This can increase your chance of falling. Ask your doctor what other things that you can do to help prevent falls. This information is not intended to replace advice given to you by your health care provider. Make sure you discuss any questions you have with your health care provider. Document Released: 07/26/2009 Document Revised: 03/06/2016 Document Reviewed: 11/03/2014 Elsevier Interactive Patient Education  2017 [employer].

## 2019-02-03 NOTE — Progress Notes (Signed)
Subjective:     Patient ID: Tyrone Walton , male    DOB: [birth year] , 67 y.o.   MRN: [id number]   Chief Complaint  Patient presents with  . 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    Hypertension  This is a chronic problem. The current episode started more than 1 year ago. The problem is uncontrolled. Pertinent negatives include no anxiety, chest pain, headaches, malaise/fatigue, neck pain or palpitations. There are no associated agents to hypertension. Risk factors for coronary artery disease include sedentary lifestyle. There are no compliance problems.   Shoulder Pain   The pain is present in the left shoulder. This is a new problem. The current episode started 1 to 4 weeks ago. There has been no history of extremity trauma. The quality of the pain is described as aching. Pertinent negatives include no fever. He has tried acetaminophen (diclofenac gel) for the symptoms. The treatment provided no relief. Family history does not include gout. There is no history of osteoarthritis.     Past Medical History:  Diagnosis Date  . CAD (coronary artery disease)   . Constipation   . ED (erectile dysfunction)   . Hyperlipidemia   . Hypertension   . Pain in limb   . Peripheral vascular disease (HCC)    claudication     Family History  Problem Relation Age of Onset  . Dementia Mother   . Heart failure Mother   . Heart attack Brother      Current Outpatient Medications:  .  acetaminophen (TYLENOL) 325 MG tablet, Take 2 tablets (650 mg total) by mouth every 4 (four) hours as needed for headache or mild pain., Disp: , Rfl:  .  aspirin 81 MG chewable tablet, Chew 1 tablet (81 mg total) by mouth daily., Disp: , Rfl:  .  atorvastatin (LIPITOR) 40 MG tablet, Take 1 tablet (40 mg total) by mouth daily., Disp: 90 tablet, Rfl: 3 .  calcium carbonate (OSCAL) 1500 (600 Ca) MG TABS tablet, Take 600 mg of elemental calcium by mouth every other day., Disp: , Rfl:   .  clopidogrel (PLAVIX) 75 MG tablet, TAKE 1 TABLET(75 MG) BY MOUTH DAILY, Disp: 90 tablet, Rfl: 3 .  magnesium oxide (MAG-OX) 400 MG tablet, Take 400 mg by mouth daily. , Disp: , Rfl:  .  Multiple Vitamins-Minerals (ONE-A-DAY MENS 50+ ADVANTAGE) TABS, Take 1 tablet by mouth daily., Disp: , Rfl:  .  olmesartan-hydrochlorothiazide (BENICAR HCT) 20-12.5 MG tablet, TAKE 1 TABLET BY MOUTH EVERY DAY, Disp: 90 tablet, Rfl: 0   No Known Allergies   Review of Systems  Constitutional: Negative.  Negative for fever and malaise/fatigue.  HENT: Negative.   Eyes: Negative.   Respiratory: Negative.   Cardiovascular: Negative.  Negative for chest pain, palpitations and leg swelling.  Gastrointestinal: Negative.   Endocrine: Negative for polydipsia, polyphagia and polyuria.  Musculoskeletal: Positive for arthralgias. Negative for back pain, gait problem, joint swelling, myalgias and neck pain.  Neurological: Negative.  Negative for dizziness and headaches.  Hematological: Negative.   Psychiatric/Behavioral: Negative.      Today's Vitals   02/03/19 0848  BP: (!) 142/78  Pulse: 75  Temp: 97.6 F (36.4 C)  TempSrc: Oral  Weight: 158 lb (71.7 kg)  Height: 5\' 8"  (1.727 m)  PainSc: 0-No pain   Body mass index is 24.02 kg/m.   Objective:  Physical Exam Vitals signs reviewed.  Constitutional:  Appearance: Normal appearance.  Cardiovascular:     Rate and Rhythm: Normal rate and regular rhythm.     Pulses: Normal pulses.     Heart sounds: Murmur present.  Pulmonary:     Effort: Pulmonary effort is normal.     Breath sounds: Normal breath sounds.  Musculoskeletal: Normal range of motion.        General: No swelling or tenderness (left shoulder pain ).  Skin:    General: Skin is warm and dry.     Capillary Refill: Capillary refill takes less than 2 seconds.  Neurological:     General: No focal deficit present.     Mental Status: He is alert and oriented to person, place, and time.   Psychiatric:        Mood and Affect: Mood normal.        Behavior: Behavior normal.        Thought Content: Thought content normal.        Judgment: Judgment normal.         Assessment And Plan:     1. Essential hypertension . B/P is fair control.  . CMP ordered to check renal function.  . The importance of regular exercise and dietary modification was stressed to the patient.  . Stressed importance of losing ten percent of her body weight to help with B/P control.  . The weight loss would help with decreasing cardiac and cancer risk as well.    2. Mixed hyperlipridemia  Chronic, controlled  Continue with current medications  3. Chronic left shoulder pain  Tenderness left shoulder with decreased ROM - diclofenac sodium (VOLTAREN) 1 % GEL; Apply 2 g topically 4 (four) times daily.  Dispense: 100 g; Refill: 3  4. Muscle strain  Tenderness to his right thoracic back area  Encouraged to stretch regularly  [name], FNP    THE PATIENT IS ENCOURAGED TO PRACTICE SOCIAL DISTANCING DUE TO THE COVID-19 PANDEMIC.

## 2019-02-03 NOTE — Addendum Note (Signed)
Addended by: Stormy Fabian D on: 02/03/2019 12:52 PM   Modules accepted: Miquel Dunn

## 2019-02-03 NOTE — Patient Instructions (Signed)
Shoulder Pain Many things can cause shoulder pain, including:  An injury.  Moving the shoulder in the same way again and again (overuse).  Joint pain (arthritis). Pain can come from:  Swelling and irritation (inflammation) of any part of the shoulder.  An injury to the shoulder joint.  An injury to: ? Tissues that connect muscle to bone (tendons). ? Tissues that connect bones to each other (ligaments). ? Bones. Follow these instructions at home: Watch for changes in your symptoms. Let your doctor know about them. Follow these instructions to help with your pain. If you have a sling:  Wear the sling as told by your doctor. Remove it only as told by your doctor.  Loosen the sling if your fingers: ? Tingle. ? Become numb. ? Turn cold and blue.  Keep the sling clean.  If the sling is not waterproof: ? Do not let it get wet. ? Take the sling off when you shower or bathe. Managing pain, stiffness, and swelling   If told, put ice on the painful area: ? Put ice in a plastic bag. ? Place a towel between your skin and the bag. ? Leave the ice on for 20 minutes, 2-3 times a day. Stop putting ice on if it does not help with the pain.  Squeeze a soft ball or a foam pad as much as possible. This prevents swelling in the shoulder. It also helps to strengthen the arm. General instructions  Take over-the-counter and prescription medicines only as told by your doctor.  Keep all follow-up visits as told by your doctor. This is important. Contact a doctor if:  Your pain gets worse.  Medicine does not help your pain.  You have new pain in your arm, hand, or fingers. Get help right away if:  Your arm, hand, or fingers: ? Tingle. ? Are numb. ? Are swollen. ? Are painful. ? Turn white or blue. Summary  Shoulder pain can be caused by many things. These include injury, moving the shoulder in the same away again and again, and joint pain.  Watch for changes in your symptoms.  Let your doctor know about them.  This condition may be treated with a sling, ice, and pain medicine.  Contact your doctor if the pain gets worse or you have new pain. Get help right away if your arm, hand, or fingers tingle or get numb, swollen, or painful.  Keep all follow-up visits as told by your doctor. This is important. This information is not intended to replace advice given to you by your health care provider. Make sure you discuss any questions you have with your health care provider. Document Released: 03/17/2008 Document Revised: 04/13/2018 Document Reviewed: 04/13/2018 Elsevier Interactive Patient Education  2019 [employer]  

## 2019-02-03 NOTE — Progress Notes (Signed)
Subjective:   Tyrone Walton is a 68 y.o. male who presents for Medicare Annual/Subsequent preventive examination.  Review of Systems:  n/a Cardiac Risk Factors include: sedentary lifestyle;male gender;hypertension;dyslipidemia;advanced age (>53men, >41 women)     Objective:    Vitals: BP (!) 142/78 (BP Location: Left Arm, Patient Position: Sitting)   Pulse 75   Temp 97.6 F (36.4 C) (Oral)   Ht 5\' 8"  (1.727 m)   Wt 158 lb 12.8 oz (72 kg)   SpO2 98%   BMI 24.15 kg/m   Body mass index is 24.15 kg/m.  Advanced Directives 02/03/2019 07/10/2017 05/10/2015 04/12/2015  Does Patient Have a Medical Advance Directive? No No No No  Would patient like information on creating a medical advance directive? No - Patient declined - No - patient declined information No - patient declined information    Tobacco Social History   Tobacco Use  Smoking Status Former Smoker  . Packs/day: 0.50  . Years: 49.00  . Pack years: 24.50  . Types: Cigarettes  . Last attempt to quit: 04/13/2015  . Years since quitting: 3.8  Smokeless Tobacco Never Used  Tobacco Comment   Currently use Vapor     Counseling given: Not Answered Comment: Currently use Vapor   Clinical Intake:  Pre-visit preparation completed: Yes  Pain : 0-10 Pain Score: 1  Pain Type: Acute pain Pain Location: Shoulder Pain Orientation: Left Pain Radiating Towards: left back Pain Descriptors / Indicators: Sharp Pain Onset: 1 to 4 weeks ago Pain Frequency: Intermittent Pain Relieving Factors: tylenol helps  Pain Relieving Factors: tylenol helps  Nutritional Status: BMI of 19-24  Normal Nutritional Risks: None Diabetes: No  How often do you need to have someone help you when you read instructions, pamphlets, or other written materials from your doctor or pharmacy?: 1 - Never What is the last grade level you completed in school?: 10th grade  Interpreter Needed?: No  Information entered by :: NAllen LPN  Past Medical  History:  Diagnosis Date  . CAD (coronary artery disease)   . Constipation   . ED (erectile dysfunction)   . Hyperlipidemia   . Hypertension   . Pain in limb   . Peripheral vascular disease ([place])    claudication   Past Surgical History:  Procedure Laterality Date  . BACK SURGERY    . LUMBAR DISC SURGERY  ~ 2013  . PERIPHERAL VASCULAR CATHETERIZATION N/A 04/12/2015   Procedure: Lower Extremity Angiography;  Surgeon: Lorretta Harp, MD;  Location: [facility];  Service: Cardiovascular;  Laterality: N/A;  . PERIPHERAL VASCULAR CATHETERIZATION Right 05/10/2015   Procedure: Peripheral Vascular Intervention;  Surgeon: Lorretta Harp, MD;  Location: [facility];  Service: Cardiovascular;  Laterality: Right;  rt ext iliac stent   Family History  Problem Relation Age of Onset  . Dementia Mother   . Heart failure Mother   . Heart attack Brother    Social History   Socioeconomic History  . Marital status: Married    Spouse name: Not on file  . Number of children: Not on file  . Years of education: Not on file  . Highest education level: Not on file  Occupational History  . Occupation: retired  . Occupation: part time employed  Social Needs  . Financial resource strain: Not hard at all  . Food insecurity:    Worry: Never true    Inability: Never true  . Transportation needs:    Medical: No  Non-medical: No  Tobacco Use  . Smoking status: Former Smoker    Packs/day: 0.50    Years: 49.00    Pack years: 24.50    Types: Cigarettes    Last attempt to quit: 04/13/2015    Years since quitting: 3.8  . Smokeless tobacco: Never Used  . Tobacco comment: Currently use Vapor  Substance and Sexual Activity  . Alcohol use: Yes    Alcohol/week: 6.0 standard drinks    Types: 6 Cans of beer per week    Comment: 05/10/2015 "I only drink on the weekends; down from 12 to 6 cans of beer"  . Drug use: No  . Sexual activity: Yes  Lifestyle  . Physical activity:    Days per  week: 0 days    Minutes per session: 0 min  . Stress: Not at all  Relationships  . Social connections:    Talks on phone: Not on file    Gets together: Not on file    Attends religious service: Not on file    Active member of club or organization: Not on file    Attends meetings of clubs or organizations: Not on file    Relationship status: Not on file  Other Topics Concern  . Not on file  Social History Narrative  . Not on file    Outpatient Encounter Medications as of 02/03/2019  Medication Sig  . acetaminophen (TYLENOL) 325 MG tablet Take 2 tablets (650 mg total) by mouth every 4 (four) hours as needed for headache or mild pain.  Marland Kitchen aspirin 81 MG chewable tablet Chew 1 tablet (81 mg total) by mouth daily.  Marland Kitchen atorvastatin (LIPITOR) 40 MG tablet Take 1 tablet (40 mg total) by mouth daily.  . calcium carbonate (OSCAL) 1500 (600 Ca) MG TABS tablet Take 600 mg of elemental calcium by mouth every other day.  . clopidogrel (PLAVIX) 75 MG tablet TAKE 1 TABLET(75 MG) BY MOUTH DAILY  . magnesium oxide (MAG-OX) 400 MG tablet Take 400 mg by mouth daily.   . Multiple Vitamins-Minerals (ONE-A-DAY MENS 50+ ADVANTAGE) TABS Take 1 tablet by mouth daily.  Marland Kitchen olmesartan-hydrochlorothiazide (BENICAR HCT) 20-12.5 MG tablet TAKE 1 TABLET BY MOUTH EVERY DAY   No facility-administered encounter medications on file as of 02/03/2019.     Activities of Daily Living In your present state of health, do you have any difficulty performing the following activities: 02/03/2019  Hearing? N  Vision? N  Difficulty concentrating or making decisions? N  Walking or climbing stairs? Y  Comment legs issue with lots of stairs  Dressing or bathing? N  Doing errands, shopping? N  Preparing Food and eating ? N  Using the Toilet? N  In the past six months, have you accidently leaked urine? N  Do you have problems with loss of bowel control? N  Managing your Medications? N  Managing your Finances? N  Housekeeping or  managing your Housekeeping? N  Some recent data might be hidden    Patient Care Team: [name], FNP as PCP - General (General Practice)   Assessment:   This is a routine wellness examination for Tyrone Walton.  Exercise Activities and Dietary recommendations Current Exercise Habits: The patient does not participate in regular exercise at present  Goals    . Patient Stated (pt-stated)     Stay healthy       Fall Risk Fall Risk  02/03/2019 09/24/2018  Falls in the past year? 0 0  Risk for fall due to :  Medication side effect -  Follow up Education provided;Falls prevention discussed -   Is the patient's home free of loose throw rugs in walkways, pet beds, electrical cords, etc?   yes      Grab bars in the bathroom? no      Handrails on the stairs?   no      Adequate lighting?   yes  Timed Get Up and Go Performed: n/a  Depression Screen PHQ 2/9 Scores 02/03/2019 09/24/2018  PHQ - 2 Score 0 0  PHQ- 9 Score 0 -    Cognitive Function     6CIT Screen 02/03/2019  What Year? 0 points  What month? 0 points  What time? 0 points  Count back from 20 0 points  Months in reverse 4 points  Repeat phrase 2 points  Total Score 6    Immunization History  Administered Date(s) Administered  . Influenza-Unspecified 07/13/2018    Qualifies for Shingles Vaccine?  yes  Screening Tests Health Maintenance  Topic Date Due  . COLONOSCOPY  11/06/2000  . PNA vac Low Risk Adult (2 of 2 - PPSV23) 05/28/2017  . Hepatitis C Screening  02/03/2020 (Originally 09/09/1951)  . INFLUENZA VACCINE  05/14/2019  . TETANUS/TDAP  03/13/2026   Cancer Screenings: Lung: Low Dose CT Chest recommended if Age 10-80 years, 30 pack-year currently smoking OR have quit w/in 15years. Patient does not qualify. Colorectal: up to date  Additional Screenings:  Hepatitis C Screening: declines      Plan:   Wants to stay healthy. Declines hep c screening.  I have personally reviewed and noted the following in  the patient's chart:   . Medical and social history . Use of alcohol, tobacco or illicit drugs  . Current medications and supplements . Functional ability and status . Nutritional status . Physical activity . Advanced directives . List of other physicians . Hospitalizations, surgeries, and ER visits in previous 12 months . Vitals . Screenings to include cognitive, depression, and falls . Referrals and appointments  In addition, I have reviewed and discussed with patient certain preventive protocols, quality metrics, and best practice recommendations. A written personalized care plan for preventive services as well as general preventive health recommendations were provided to patient.     Kellie Simmering, LPN  02/27/16

## 2019-02-04 LAB — CBC
Hematocrit: 46.1 % (ref 37.5–51.0)
Hemoglobin: 14.8 g/dL (ref 13.0–17.7)
MCH: 29.6 pg (ref 26.6–33.0)
MCHC: 32.1 g/dL (ref 31.5–35.7)
MCV: 92 fL (ref 79–97)
Platelets: 205 10*3/uL (ref 150–450)
RBC: 5 x10E6/uL (ref 4.14–5.80)
RDW: 13.7 % (ref 11.6–15.4)
WBC: 5.4 10*3/uL (ref 3.4–10.8)

## 2019-02-04 LAB — CMP14 + ANION GAP
ALT: 23 IU/L (ref 0–44)
AST: 21 IU/L (ref 0–40)
Albumin/Globulin Ratio: 1.8 (ref 1.2–2.2)
Albumin: 4.5 g/dL (ref 3.8–4.8)
Alkaline Phosphatase: 66 IU/L (ref 39–117)
Anion Gap: 17 mmol/L (ref 10.0–18.0)
BUN/Creatinine Ratio: 12 (ref 10–24)
BUN: 10 mg/dL (ref 8–27)
Bilirubin Total: 0.5 mg/dL (ref 0.0–1.2)
CO2: 21 mmol/L (ref 20–29)
Calcium: 9.7 mg/dL (ref 8.6–10.2)
Chloride: 102 mmol/L (ref 96–106)
Creatinine, Ser: 0.86 mg/dL (ref 0.76–1.27)
GFR calc Af Amer: 103 mL/min/{1.73_m2} (ref >59)
GFR calc non Af Amer: 89 mL/min/{1.73_m2} (ref >59)
Globulin, Total: 2.5 g/dL (ref 1.5–4.5)
Glucose: 87 mg/dL (ref 65–99)
Potassium: 4.5 mmol/L (ref 3.5–5.2)
Sodium: 140 mmol/L (ref 134–144)
Total Protein: 7 g/dL (ref 6.0–8.5)

## 2019-02-04 LAB — LIPID PANEL
Chol/HDL Ratio: 2.8 ratio (ref 0.0–5.0)
Cholesterol, Total: 186 mg/dL (ref 100–199)
HDL: 66 mg/dL (ref >39)
LDL Calculated: 89 mg/dL (ref 0–99)
Triglycerides: 153 mg/dL — ABNORMAL HIGH (ref 0–149)
VLDL Cholesterol Cal: 31 mg/dL (ref 5–40)

## 2019-02-09 ENCOUNTER — Other Ambulatory Visit: Payer: Self-pay | Admitting: Nurse Practitioner

## 2019-02-10 DIAGNOSIS — T148XXA Other injury of unspecified body region, initial encounter: Secondary | ICD-10-CM | POA: Insufficient documentation

## 2019-02-10 DIAGNOSIS — M25512 Pain in left shoulder: Secondary | ICD-10-CM

## 2019-02-10 DIAGNOSIS — G8929 Other chronic pain: Secondary | ICD-10-CM

## 2019-02-10 HISTORY — DX: Other chronic pain: G89.29

## 2019-02-10 MED ORDER — CYCLOBENZAPRINE HCL 10 MG PO TABS: 10.0000 mg | ORAL_TABLET | Freq: Three times a day (TID) | ORAL | 0 refills | Status: DC | PRN

## 2019-02-17 ENCOUNTER — Encounter: Payer: Self-pay | Admitting: Nurse Practitioner

## 2019-05-10 ENCOUNTER — Other Ambulatory Visit: Payer: Self-pay | Admitting: Nurse Practitioner

## 2019-06-09 ENCOUNTER — Ambulatory Visit: Payer: Medicare Other | Admitting: Nurse Practitioner

## 2019-06-14 ENCOUNTER — Encounter: Payer: Self-pay | Admitting: Nurse Practitioner

## 2019-06-28 ENCOUNTER — Ambulatory Visit (INDEPENDENT_AMBULATORY_CARE_PROVIDER_SITE_OTHER): Payer: Medicare Other | Admitting: Nurse Practitioner

## 2019-06-28 ENCOUNTER — Other Ambulatory Visit: Payer: Self-pay

## 2019-06-28 ENCOUNTER — Encounter: Payer: Self-pay | Admitting: Nurse Practitioner

## 2019-06-28 VITALS — BP 158/78 | HR 71 | Temp 97.5°F | Ht 68.0 in | Wt 159.0 lb

## 2019-06-28 DIAGNOSIS — I739 Peripheral vascular disease, unspecified: Secondary | ICD-10-CM | POA: Diagnosis not present

## 2019-06-28 DIAGNOSIS — E782 Mixed hyperlipidemia: Secondary | ICD-10-CM

## 2019-06-28 DIAGNOSIS — I10 Essential (primary) hypertension: Secondary | ICD-10-CM | POA: Diagnosis not present

## 2019-06-28 NOTE — Progress Notes (Signed)
Subjective:     Patient ID: Tyrone Walton , male    DOB: 05-23-1951 , 68 y.o.   MRN: [id number]   Chief Complaint  Patient presents with  . Hypertension    f/u    HPI  He is drinking 2-3 bottles of water per day.    Hypertension This is a chronic problem. The current episode started more than 1 year ago. The problem has been gradually worsening since onset. The problem is uncontrolled. Pertinent negatives include no anxiety, chest pain, headaches, malaise/fatigue, neck pain, palpitations or peripheral edema. There are no associated agents to hypertension. Risk factors for coronary artery disease include sedentary lifestyle. There are no compliance problems.  There is no history of angina or kidney disease. There is no history of chronic renal disease.     Past Medical History:  Diagnosis Date  . CAD (coronary artery disease)   . Constipation   . ED (erectile dysfunction)   . Hyperlipidemia   . Hypertension   . Pain in limb   . Peripheral vascular disease ([facility])    claudication     Family History  Problem Relation Age of Onset  . Dementia Mother   . Heart failure Mother   . Heart attack Brother      Current Outpatient Medications:  .  acetaminophen (TYLENOL) 325 MG tablet, Take 2 tablets (650 mg total) by mouth every 4 (four) hours as needed for headache or mild pain., Disp: , Rfl:  .  aspirin 81 MG chewable tablet, Chew 1 tablet (81 mg total) by mouth daily., Disp: , Rfl:  .  atorvastatin (LIPITOR) 40 MG tablet, Take 1 tablet (40 mg total) by mouth daily., Disp: 90 tablet, Rfl: 3 .  calcium carbonate (OSCAL) 1500 (600 Ca) MG TABS tablet, Take 600 mg of elemental calcium by mouth every other day., Disp: , Rfl:  .  clopidogrel (PLAVIX) 75 MG tablet, TAKE 1 TABLET(75 MG) BY MOUTH DAILY, Disp: 90 tablet, Rfl: 3 .  cyclobenzaprine (FLEXERIL) 10 MG tablet, Take 1 tablet (10 mg total) by mouth 3 (three) times daily as needed for muscle spasms., Disp: 30 tablet, Rfl: 0 .   diclofenac sodium (VOLTAREN) 1 % GEL, Apply 2 g topically 4 (four) times daily., Disp: 100 g, Rfl: 3 .  magnesium oxide (MAG-OX) 400 MG tablet, Take 400 mg by mouth daily. , Disp: , Rfl:  .  Multiple Vitamins-Minerals (ONE-A-DAY MENS 50+ ADVANTAGE) TABS, Take 1 tablet by mouth daily., Disp: , Rfl:  .  olmesartan-hydrochlorothiazide (BENICAR HCT) 20-12.5 MG tablet, TAKE 1 TABLET BY MOUTH EVERY DAY, Disp: 90 tablet, Rfl: 0   No Known Allergies   Review of Systems  Constitutional: Negative.  Negative for fever and malaise/fatigue.  HENT: Negative.   Eyes: Negative.   Respiratory: Negative.   Cardiovascular: Negative.  Negative for chest pain, palpitations and leg swelling.  Gastrointestinal: Negative.   Endocrine: Negative for polydipsia, polyphagia and polyuria.  Musculoskeletal: Negative for arthralgias, back pain, gait problem, joint swelling, myalgias and neck pain.  Neurological: Negative.  Negative for dizziness and headaches.  Hematological: Negative.   Psychiatric/Behavioral: Negative.      Today's Vitals   06/28/19 1039  BP: (!) 158/78  Pulse: 71  Temp: (!) 97.5 F (36.4 C)  TempSrc: Oral  Weight: 159 lb (72.1 kg)  Height: 5\' 8"  (1.727 m)   Body mass index is 24.18 kg/m.   Objective:  Physical Exam Vitals signs reviewed.  Constitutional:  Appearance: Normal appearance.  Cardiovascular:     Rate and Rhythm: Normal rate and regular rhythm.     Pulses: Normal pulses.     Heart sounds: Normal heart sounds. No murmur.  Pulmonary:     Effort: Pulmonary effort is normal.     Breath sounds: Normal breath sounds.  Musculoskeletal: Normal range of motion.        General: No swelling, tenderness, deformity or signs of injury.     Right lower leg: No edema.     Left lower leg: No edema.  Skin:    General: Skin is warm and dry.     Capillary Refill: Capillary refill takes less than 2 seconds.  Neurological:     General: No focal deficit present.     Mental Status:  He is alert and oriented to person, place, and time.  Psychiatric:        Mood and Affect: Mood normal.        Behavior: Behavior normal.        Thought Content: Thought content normal.        Judgment: Judgment normal.         Assessment And Plan:     1. Essential hypertension . B/P is fair control.  . CMP ordered to check renal function.  . The importance of regular exercise and dietary modification was stressed to the patient. I have advised to avoid eating high salt foods to include country ham.   2. Mixed hyperlipridemia  Chronic, controlled  Continue with current medications  Cramps have improved encouraged to continue to increase water intake.  3. Peripheral vascular disease ([place])  Has appt with Dr. Gwenlyn Found on Oct 19th.     [name], FNP    THE PATIENT IS ENCOURAGED TO PRACTICE SOCIAL DISTANCING DUE TO THE COVID-19 PANDEMIC.

## 2019-06-29 LAB — CMP14 + ANION GAP
ALT: 22 IU/L (ref 0–44)
AST: 22 IU/L (ref 0–40)
Albumin/Globulin Ratio: 2.6 — ABNORMAL HIGH (ref 1.2–2.2)
Albumin: 4.9 g/dL — ABNORMAL HIGH (ref 3.8–4.8)
Alkaline Phosphatase: 70 IU/L (ref 39–117)
Anion Gap: 12 mmol/L (ref 10.0–18.0)
BUN/Creatinine Ratio: 14 (ref 10–24)
BUN: 13 mg/dL (ref 8–27)
Bilirubin Total: 0.6 mg/dL (ref 0.0–1.2)
CO2: 25 mmol/L (ref 20–29)
Calcium: 9.8 mg/dL (ref 8.6–10.2)
Chloride: 104 mmol/L (ref 96–106)
Creatinine, Ser: 0.9 mg/dL (ref 0.76–1.27)
GFR calc Af Amer: 101 mL/min/{1.73_m2} (ref >59)
GFR calc non Af Amer: 87 mL/min/{1.73_m2} (ref >59)
Globulin, Total: 1.9 g/dL (ref 1.5–4.5)
Glucose: 91 mg/dL (ref 65–99)
Potassium: 4.7 mmol/L (ref 3.5–5.2)
Sodium: 141 mmol/L (ref 134–144)
Total Protein: 6.8 g/dL (ref 6.0–8.5)

## 2019-07-28 ENCOUNTER — Other Ambulatory Visit (HOSPITAL_COMMUNITY): Payer: Self-pay | Admitting: Cardiovascular Disease

## 2019-07-28 DIAGNOSIS — I739 Peripheral vascular disease, unspecified: Secondary | ICD-10-CM

## 2019-07-28 DIAGNOSIS — Z95828 Presence of other vascular implants and grafts: Secondary | ICD-10-CM

## 2019-08-01 ENCOUNTER — Ambulatory Visit (HOSPITAL_BASED_OUTPATIENT_CLINIC_OR_DEPARTMENT_OTHER)
Admission: RE | Admit: 2019-08-01 | Discharge: 2019-08-01 | Disposition: A | Discharge status: Home / Self Care | Payer: Medicare Other | Source: Ambulatory Visit | Attending: Cardiology | Admitting: Cardiology

## 2019-08-01 ENCOUNTER — Other Ambulatory Visit: Payer: Self-pay

## 2019-08-01 ENCOUNTER — Ambulatory Visit (HOSPITAL_COMMUNITY)
Admission: RE | Admit: 2019-08-01 | Discharge: 2019-08-01 | Disposition: A | Discharge status: Home / Self Care | Payer: Medicare Other | Source: Ambulatory Visit | Attending: Cardiology | Admitting: Cardiology

## 2019-08-01 DIAGNOSIS — Z95828 Presence of other vascular implants and grafts: Secondary | ICD-10-CM | POA: Insufficient documentation

## 2019-08-01 DIAGNOSIS — I739 Peripheral vascular disease, unspecified: Secondary | ICD-10-CM

## 2019-08-08 ENCOUNTER — Other Ambulatory Visit: Payer: Self-pay | Admitting: Nurse Practitioner

## 2019-08-12 ENCOUNTER — Telehealth: Payer: Self-pay | Admitting: Cardiovascular Disease

## 2019-08-12 NOTE — Telephone Encounter (Signed)
° ° °  Spouse calling, she wants to accompany patient during appointment on 11/4

## 2019-08-12 NOTE — Telephone Encounter (Signed)
Pt wife wanted to know if she could accompany pt to appt. Made pt wife aware of covid restrictions. Offered Dr Gwenlyn Found to call wife while in room. Verbalized understanding.

## 2019-08-17 ENCOUNTER — Ambulatory Visit: Payer: Medicare Other | Admitting: Cardiovascular Disease

## 2019-11-01 ENCOUNTER — Other Ambulatory Visit: Payer: Self-pay

## 2019-11-01 ENCOUNTER — Encounter: Payer: Self-pay | Admitting: Nurse Practitioner

## 2019-11-01 ENCOUNTER — Ambulatory Visit (INDEPENDENT_AMBULATORY_CARE_PROVIDER_SITE_OTHER): Payer: Medicare PPO | Admitting: Nurse Practitioner

## 2019-11-01 VITALS — BP 132/84 | HR 62 | Temp 98.4°F | Ht 68.0 in | Wt 163.0 lb

## 2019-11-01 DIAGNOSIS — I10 Essential (primary) hypertension: Secondary | ICD-10-CM | POA: Diagnosis not present

## 2019-11-01 DIAGNOSIS — R432 Parageusia: Secondary | ICD-10-CM | POA: Diagnosis not present

## 2019-11-01 DIAGNOSIS — I739 Peripheral vascular disease, unspecified: Secondary | ICD-10-CM

## 2019-11-01 DIAGNOSIS — Z1152 Encounter for screening for COVID-19: Secondary | ICD-10-CM

## 2019-11-01 DIAGNOSIS — E782 Mixed hyperlipidemia: Secondary | ICD-10-CM | POA: Diagnosis not present

## 2019-11-01 DIAGNOSIS — R0989 Other specified symptoms and signs involving the circulatory and respiratory systems: Secondary | ICD-10-CM

## 2019-11-01 NOTE — Progress Notes (Signed)
Subjective:     Patient ID: Tyrone Walton , male    DOB: Jun 26, 1951 , 69 y.o.   MRN: [id number]   Chief Complaint  Patient presents with  . Hypertension  . URI    patient stated he had a head cold for a couple weeks ago, he lost his sense of taste and smell but it is now coming back. he stated he has been sneezing and having a runny nose     HPI  Symptoms of runny nose and loss of taste and smell. He has been sneezing as well. He hs been using nasal spray.    Hypertension This is a chronic problem. The current episode started more than 1 year ago. The problem has been gradually worsening since onset. The problem is uncontrolled. Pertinent negatives include no anxiety, chest pain, headaches, malaise/fatigue, neck pain, palpitations, peripheral edema or PND. There are no associated agents to hypertension. Risk factors for coronary artery disease include sedentary lifestyle. Past treatments include diuretics. There are no compliance problems.  There is no history of angina or kidney disease. There is no history of chronic renal disease.  URI  Associated symptoms include congestion (nasal), rhinorrhea and sneezing. Pertinent negatives include no chest pain, headaches or neck pain.     Past Medical History:  Diagnosis Date  . CAD (coronary artery disease)   . Constipation   . ED (erectile dysfunction)   . Hyperlipidemia   . Hypertension   . Pain in limb   . Peripheral vascular disease ([facility])    claudication     Family History  Problem Relation Age of Onset  . Dementia Mother   . Heart failure Mother   . Heart attack Brother      Current Outpatient Medications:  .  acetaminophen (TYLENOL) 325 MG tablet, Take 2 tablets (650 mg total) by mouth every 4 (four) hours as needed for headache or mild pain., Disp: , Rfl:  .  aspirin 81 MG chewable tablet, Chew 1 tablet (81 mg total) by mouth daily., Disp: , Rfl:  .  atorvastatin (LIPITOR) 40 MG tablet, Take 1 tablet (40 mg total) by  mouth daily., Disp: 90 tablet, Rfl: 3 .  calcium carbonate (OSCAL) 1500 (600 Ca) MG TABS tablet, Take 600 mg of elemental calcium by mouth 2 (two) times daily with a meal., Disp: , Rfl:  .  clopidogrel (PLAVIX) 75 MG tablet, TAKE 1 TABLET(75 MG) BY MOUTH DAILY, Disp: 90 tablet, Rfl: 3 .  cyclobenzaprine (FLEXERIL) 10 MG tablet, Take 1 tablet (10 mg total) by mouth 3 (three) times daily as needed for muscle spasms., Disp: 30 tablet, Rfl: 0 .  diclofenac sodium (VOLTAREN) 1 % GEL, Apply 2 g topically 4 (four) times daily., Disp: 100 g, Rfl: 3 .  magnesium oxide (MAG-OX) 400 MG tablet, Take 400 mg by mouth daily. , Disp: , Rfl:  .  Multiple Vitamins-Minerals (ONE-A-DAY MENS 50+ ADVANTAGE) TABS, Take 1 tablet by mouth daily., Disp: , Rfl:  .  olmesartan-hydrochlorothiazide (BENICAR HCT) 20-12.5 MG tablet, TAKE 1 TABLET BY MOUTH EVERY DAY, Disp: 90 tablet, Rfl: 0 .  calcium carbonate (OSCAL) 1500 (600 Ca) MG TABS tablet, Take 600 mg of elemental calcium by mouth every other day., Disp: , Rfl:    No Known Allergies   Review of Systems  Constitutional: Negative.  Negative for fever and malaise/fatigue.  HENT: Positive for congestion (nasal), rhinorrhea and sneezing. Negative for sinus pressure.   Eyes: Negative.   Respiratory: Negative.  Cardiovascular: Negative.  Negative for chest pain, palpitations, leg swelling and PND.  Gastrointestinal: Negative.   Endocrine: Negative for polydipsia, polyphagia and polyuria.  Musculoskeletal: Negative for arthralgias, back pain, gait problem, joint swelling, myalgias and neck pain.  Neurological: Negative.  Negative for dizziness and headaches.  Hematological: Negative.   Psychiatric/Behavioral: Negative.      Today's Vitals   11/01/19 0909  BP: 132/84  Pulse: 62  Temp: 98.4 F (36.9 C)  TempSrc: Oral  Weight: 163 lb (73.9 kg)  Height: 5\' 8"  (1.727 m)  PainSc: 0-No pain   Body mass index is 24.78 kg/m.   Objective:  Physical Exam Vitals  reviewed.  Constitutional:      Appearance: Normal appearance.  HENT:     Head: Normocephalic.  Cardiovascular:     Rate and Rhythm: Normal rate and regular rhythm.     Pulses: Normal pulses.     Heart sounds: Normal heart sounds. No murmur.  Pulmonary:     Effort: Pulmonary effort is normal.     Breath sounds: Normal breath sounds.  Musculoskeletal:        General: No swelling, tenderness, deformity or signs of injury. Normal range of motion.     Right lower leg: No edema.     Left lower leg: No edema.  Skin:    General: Skin is warm and dry.     Capillary Refill: Capillary refill takes less than 2 seconds.  Neurological:     General: No focal deficit present.     Mental Status: He is alert and oriented to person, place, and time.  Psychiatric:        Mood and Affect: Mood normal.        Behavior: Behavior normal.        Thought Content: Thought content normal.        Judgment: Judgment normal.         Assessment And Plan:     1. Essential hypertension . B/P is fair control.  . CMP ordered to check renal function.  . The importance of regular exercise and dietary modification was stressed to the patient. I have advised to avoid eating high salt foods to include country ham.   2. Mixed hyperlipridemia  Chronic, controlled  Continue with current medications  Cramps have improved encouraged to continue to increase water intake.  3. Peripheral vascular disease ([facility])  Chronic, continue with current medications  4. Loss of taste  2 week history of loss of taste and smell. - Novel Coronavirus, NAA ([facility])  5. Runny nose  Advised to take over the counter antihistamine - Novel Coronavirus, NAA ([facility])   [name], FNP    THE PATIENT IS ENCOURAGED TO PRACTICE SOCIAL DISTANCING DUE TO THE COVID-19 PANDEMIC.

## 2019-11-02 LAB — LIPID PANEL
Chol/HDL Ratio: 3.4 ratio (ref 0.0–5.0)
Cholesterol, Total: 165 mg/dL (ref 100–199)
HDL: 48 mg/dL (ref >39)
LDL Chol Calc (NIH): 95 mg/dL (ref 0–99)
Triglycerides: 125 mg/dL (ref 0–149)
VLDL Cholesterol Cal: 22 mg/dL (ref 5–40)

## 2019-11-02 LAB — CMP14+EGFR
ALT: 29 IU/L (ref 0–44)
AST: 24 IU/L (ref 0–40)
Albumin/Globulin Ratio: 1.9 (ref 1.2–2.2)
Albumin: 4.5 g/dL (ref 3.8–4.8)
Alkaline Phosphatase: 105 IU/L (ref 39–117)
BUN/Creatinine Ratio: 6 — ABNORMAL LOW (ref 10–24)
BUN: 6 mg/dL — ABNORMAL LOW (ref 8–27)
Bilirubin Total: 0.3 mg/dL (ref 0.0–1.2)
CO2: 22 mmol/L (ref 20–29)
Calcium: 9.5 mg/dL (ref 8.6–10.2)
Chloride: 104 mmol/L (ref 96–106)
Creatinine, Ser: 0.98 mg/dL (ref 0.76–1.27)
GFR calc Af Amer: 91 mL/min/{1.73_m2} (ref >59)
GFR calc non Af Amer: 79 mL/min/{1.73_m2} (ref >59)
Globulin, Total: 2.4 g/dL (ref 1.5–4.5)
Glucose: 96 mg/dL (ref 65–99)
Potassium: 4.2 mmol/L (ref 3.5–5.2)
Sodium: 140 mmol/L (ref 134–144)
Total Protein: 6.9 g/dL (ref 6.0–8.5)

## 2019-11-02 LAB — NOVEL CORONAVIRUS, NAA: SARS-CoV-2, NAA: NOT DETECTED

## 2019-11-06 ENCOUNTER — Other Ambulatory Visit: Payer: Self-pay | Admitting: Nurse Practitioner

## 2019-11-06 ENCOUNTER — Other Ambulatory Visit: Payer: Self-pay | Admitting: Cardiovascular Disease

## 2019-11-22 ENCOUNTER — Other Ambulatory Visit: Payer: Self-pay | Admitting: *Deleted

## 2019-11-22 MED ORDER — ATORVASTATIN CALCIUM 40 MG PO TABS: 40.0000 mg | ORAL_TABLET | Freq: Every day | ORAL | 0 refills | Status: DC

## 2019-11-22 NOTE — Telephone Encounter (Signed)
Rx has been sent to the pharmacy electronically. ° °

## 2019-12-12 ENCOUNTER — Encounter: Payer: Self-pay | Admitting: Nurse Practitioner

## 2020-02-08 ENCOUNTER — Encounter: Payer: Self-pay | Admitting: Nurse Practitioner

## 2020-02-08 ENCOUNTER — Ambulatory Visit: Payer: Medicare PPO | Admitting: Nurse Practitioner

## 2020-02-08 ENCOUNTER — Other Ambulatory Visit: Payer: Self-pay

## 2020-02-08 ENCOUNTER — Ambulatory Visit: Payer: Medicare Other

## 2020-02-08 ENCOUNTER — Ambulatory Visit (INDEPENDENT_AMBULATORY_CARE_PROVIDER_SITE_OTHER): Payer: Medicare PPO

## 2020-02-08 VITALS — BP 112/72 | HR 83 | Temp 98.2°F | Ht 68.0 in | Wt 153.6 lb

## 2020-02-08 VITALS — BP 112/72 | HR 83 | Temp 98.2°F | Ht 68.0 in | Wt 153.0 lb

## 2020-02-08 DIAGNOSIS — I10 Essential (primary) hypertension: Secondary | ICD-10-CM | POA: Diagnosis not present

## 2020-02-08 DIAGNOSIS — Z1159 Encounter for screening for other viral diseases: Secondary | ICD-10-CM | POA: Diagnosis not present

## 2020-02-08 DIAGNOSIS — H6122 Impacted cerumen, left ear: Secondary | ICD-10-CM

## 2020-02-08 DIAGNOSIS — Z0000 Encounter for general adult medical examination without abnormal findings: Secondary | ICD-10-CM

## 2020-02-08 DIAGNOSIS — E782 Mixed hyperlipidemia: Secondary | ICD-10-CM

## 2020-02-08 DIAGNOSIS — Z125 Encounter for screening for malignant neoplasm of prostate: Secondary | ICD-10-CM

## 2020-02-08 DIAGNOSIS — I739 Peripheral vascular disease, unspecified: Secondary | ICD-10-CM

## 2020-02-08 LAB — POCT URINALYSIS DIPSTICK
Bilirubin, UA: NEGATIVE
Glucose, UA: NEGATIVE
Ketones, UA: NEGATIVE
Leukocytes, UA: NEGATIVE
Nitrite, UA: NEGATIVE
Protein, UA: NEGATIVE
Spec Grav, UA: 1.025 (ref 1.010–1.025)
Urobilinogen, UA: 0.2 E.U./dL
pH, UA: 6 (ref 5.0–8.0)

## 2020-02-08 LAB — POCT UA - MICROALBUMIN
Albumin/Creatinine Ratio, Urine, POC: 30
Creatinine, POC: 200 mg/dL
Microalbumin Ur, POC: 30 mg/L

## 2020-02-08 NOTE — Progress Notes (Signed)
This visit occurred during the SARS-CoV-2 public health emergency.  Safety protocols were in place, including screening questions prior to the visit, additional usage of staff PPE, and extensive cleaning of exam room while observing appropriate contact time as indicated for disinfecting solutions.  Subjective:   EION KOLIN is a 69 y.o. male who presents for Medicare Annual/Subsequent preventive examination.  Review of Systems:  n/a Cardiac Risk Factors include: advanced age (>2men, >56 women);dyslipidemia;hypertension;male gender;smoking/ tobacco exposure     Objective:    Vitals: BP 112/72 (BP Location: Left Arm, Patient Position: Sitting)   Pulse 83   Temp 98.2 F (36.8 C) (Oral)   Ht 5\' 8"  (1.727 m)   Wt 153 lb (69.4 kg)   BMI 23.26 kg/m   Body mass index is 23.26 kg/m.  Advanced Directives 02/08/2020 02/03/2019 07/10/2017 05/10/2015 04/12/2015  Does Patient Have a Medical Advance Directive? No No No No No  Would patient like information on creating a medical advance directive? No - Patient declined No - Patient declined - No - patient declined information No - patient declined information    Tobacco Social History   Tobacco Use  Smoking Status Former Smoker  . Packs/day: 0.50  . Years: 49.00  . Pack years: 24.50  . Types: Cigarettes  . Quit date: 04/13/2015  . Years since quitting: 4.8  Smokeless Tobacco Never Used  Tobacco Comment   Currently use Vapor     Counseling given: Not Answered Comment: Currently use Vapor   Clinical Intake:  Pre-visit preparation completed: Yes  Pain : No/denies pain     Nutritional Status: BMI of 19-24  Normal Nutritional Risks: None Diabetes: No  How often do you need to have someone help you when you read instructions, pamphlets, or other written materials from your doctor or pharmacy?: 1 - Never What is the last grade level you completed in school?: 9th grade  Interpreter Needed?: No  Information entered by :: NAllen  LPN  Past Medical History:  Diagnosis Date  . CAD (coronary artery disease)   . Constipation   . ED (erectile dysfunction)   . Hyperlipidemia   . Hypertension   . Pain in limb   . Peripheral vascular disease ([place])    claudication   Past Surgical History:  Procedure Laterality Date  . BACK SURGERY    . LUMBAR DISC SURGERY  ~ 2013  . PERIPHERAL VASCULAR CATHETERIZATION N/A 04/12/2015   Procedure: Lower Extremity Angiography;  Surgeon: Lorretta Harp, MD;  Location: [facility];  Service: Cardiovascular;  Laterality: N/A;  . PERIPHERAL VASCULAR CATHETERIZATION Right 05/10/2015   Procedure: Peripheral Vascular Intervention;  Surgeon: Lorretta Harp, MD;  Location: [facility];  Service: Cardiovascular;  Laterality: Right;  rt ext iliac stent   Family History  Problem Relation Age of Onset  . Dementia Mother   . Heart failure Mother   . Heart attack Brother    Social History   Socioeconomic History  . Marital status: Married    Spouse name: Not on file  . Number of children: Not on file  . Years of education: Not on file  . Highest education level: Not on file  Occupational History  . Occupation: retired  . Occupation: part time employed  Tobacco Use  . Smoking status: Former Smoker    Packs/day: 0.50    Years: 49.00    Pack years: 24.50    Types: Cigarettes    Quit date: 04/13/2015  Years since quitting: 4.8  . Smokeless tobacco: Never Used  . Tobacco comment: Currently use Vapor  Substance and Sexual Activity  . Alcohol use: Yes    Alcohol/week: 6.0 standard drinks    Types: 6 Cans of beer per week    Comment: 05/10/2015 "I only drink on the weekends; down from 12 to 6 cans of beer"  . Drug use: No  . Sexual activity: Yes  Other Topics Concern  . Not on file  Social History Narrative  . Not on file   Social Determinants of Health   Financial Resource Strain: Low Risk   . Difficulty of Paying Living Expenses: Not hard at all  Food  Insecurity: No Food Insecurity  . Worried About Charity fundraiser in the Last Year: Never true  . Ran Out of Food in the Last Year: Never true  Transportation Needs: No Transportation Needs  . Lack of Transportation (Medical): No  . Lack of Transportation (Non-Medical): No  Physical Activity: Inactive  . Days of Exercise per Week: 0 days  . Minutes of Exercise per Session: 0 min  Stress: No Stress Concern Present  . Feeling of Stress : Not at all  Social Connections:   . Frequency of Communication with Friends and Family:   . Frequency of Social Gatherings with Friends and Family:   . Attends Religious Services:   . Active Member of Clubs or Organizations:   . Attends Archivist Meetings:   Marland Kitchen Marital Status:     Outpatient Encounter Medications as of 02/08/2020  Medication Sig  . acetaminophen (TYLENOL) 325 MG tablet Take 2 tablets (650 mg total) by mouth every 4 (four) hours as needed for headache or mild pain.  Marland Kitchen aspirin 81 MG chewable tablet Chew 1 tablet (81 mg total) by mouth daily.  Marland Kitchen atorvastatin (LIPITOR) 40 MG tablet Take 1 tablet (40 mg total) by mouth daily. Need appointment for future refills  . calcium carbonate (OSCAL) 1500 (600 Ca) MG TABS tablet Take 600 mg of elemental calcium by mouth every other day.  . clopidogrel (PLAVIX) 75 MG tablet TAKE 1 TABLET(75 MG) BY MOUTH DAILY. Please make annual appt with Dr. Gwenlyn Found for refills. Thank you  . cyclobenzaprine (FLEXERIL) 10 MG tablet Take 1 tablet (10 mg total) by mouth 3 (three) times daily as needed for muscle spasms.  . diclofenac sodium (VOLTAREN) 1 % GEL Apply 2 g topically 4 (four) times daily.  Marland Kitchen MAGNESIUM OXIDE PO Take 500 mg by mouth daily.   . Multiple Vitamins-Minerals (ONE-A-DAY MENS 50+ ADVANTAGE) TABS Take 1 tablet by mouth daily.  Marland Kitchen olmesartan-hydrochlorothiazide (BENICAR HCT) 20-12.5 MG tablet TAKE 1 TABLET BY MOUTH EVERY DAY   No facility-administered encounter medications on file as of  02/08/2020.    Activities of Daily Living In your present state of health, do you have any difficulty performing the following activities: 02/08/2020 02/08/2020  Hearing? N N  Vision? N N  Difficulty concentrating or making decisions? N N  Walking or climbing stairs? N N  Dressing or bathing? N N  Doing errands, shopping? N N  Preparing Food and eating ? N -  Using the Toilet? N -  In the past six months, have you accidently leaked urine? N -  Do you have problems with loss of bowel control? N -  Managing your Medications? N -  Managing your Finances? N -  Housekeeping or managing your Housekeeping? N -  Some recent data might be hidden  Patient Care Team: [name], FNP as PCP - General (General Practice)   Assessment:   This is a routine wellness examination for Yanky.  Exercise Activities and Dietary recommendations Current Exercise Habits: The patient has a physically strenuous job, but has no regular exercise apart from work.  Goals    . Patient Stated (pt-stated)     Stay healthy    . Patient Stated     02/08/2020, wants to stay healthy       Fall Risk Fall Risk  02/08/2020 02/08/2020 11/01/2019 06/28/2019 02/03/2019  Falls in the past year? 0 0 0 0 0  Risk for fall due to : Medication side effect - - - Medication side effect  Follow up Falls evaluation completed;Education provided;Falls prevention discussed - - - Education provided;Falls prevention discussed   Is the patient's home free of loose throw rugs in walkways, pet beds, electrical cords, etc?   yes      Grab bars in the bathroom? no      Handrails on the stairs?   no      Adequate lighting?   yes  Timed Get Up and Go Performed: n/a  Depression Screen PHQ 2/9 Scores 02/08/2020 02/08/2020 11/01/2019 02/03/2019  PHQ - 2 Score 0 0 0 0  PHQ- 9 Score 0 - - 0    Cognitive Function     6CIT Screen 02/08/2020 02/03/2019  What Year? 0 points 0 points  What month? 0 points 0 points  What time? 0 points 0  points  Count back from 20 0 points 0 points  Months in reverse 4 points 4 points  Repeat phrase 4 points 2 points  Total Score 8 6    Immunization History  Administered Date(s) Administered  . Influenza-Unspecified 07/13/2018, 06/11/2019  . PFIZER SARS-COV-2 Vaccination 12/10/2019, 01/07/2020  . Pneumococcal Conjugate-13 05/29/2015  . Pneumococcal Polysaccharide-23 05/28/2016  . Tdap 03/13/2016  . Zoster Recombinat (Shingrix) 01/29/2017    Qualifies for Shingles Vaccine? yes  Screening Tests Health Maintenance  Topic Date Due  . Hepatitis C Screening  Never done  . COLONOSCOPY  Never done  . INFLUENZA VACCINE  05/13/2020  . TETANUS/TDAP  03/13/2026  . COVID-19 Vaccine  Completed  . PNA vac Low Risk Adult  Completed   Cancer Screenings: Lung: Low Dose CT Chest recommended if Age 76-80 years, 30 pack-year currently smoking OR have quit w/in 15years. Patient does not qualify. Colorectal: requesting report  Additional Screenings:  Hepatitis C Screening: today      Plan:    Patient wants to continue to stay healthy.  I have personally reviewed and noted the following in the patient's chart:   . Medical and social history . Use of alcohol, tobacco or illicit drugs  . Current medications and supplements . Functional ability and status . Nutritional status . Physical activity . Advanced directives . List of other physicians . Hospitalizations, surgeries, and ER visits in previous 12 months . Vitals . Screenings to include cognitive, depression, and falls . Referrals and appointments  In addition, I have reviewed and discussed with patient certain preventive protocols, quality metrics, and best practice recommendations. A written personalized care plan for preventive services as well as general preventive health recommendations were provided to patient.     Kellie Simmering, LPN  [phone]

## 2020-02-08 NOTE — Progress Notes (Signed)
This visit occurred during the SARS-CoV-2 public health emergency.  Safety protocols were in place, including screening questions prior to the visit, additional usage of staff PPE, and extensive cleaning of exam room while observing appropriate contact time as indicated for disinfecting solutions.  Subjective:     Patient ID: Tyrone Walton , male    DOB: [birth year] , 69 y.o.   MRN: [id number]   Chief Complaint  Patient presents with  . Annual Exam    HPI  Hypertension This is a chronic problem. The current episode started more than 1 year ago. The problem is unchanged. The problem is controlled. Pertinent negatives include no anxiety, chest pain, malaise/fatigue, palpitations, peripheral edema or PND. There are no associated agents to hypertension. Risk factors for coronary artery disease include sedentary lifestyle and male gender. Past treatments include diuretics. The current treatment provides significant improvement. There are no compliance problems.  There is no history of angina or kidney disease. There is no history of chronic renal disease.     Men's preventive visit. Patient Health Questionnaire (PHQ-2) is    Office Visit from 02/08/2020 in [facility]  PHQ-2 Total Score  0     Patient is on a regular diet, cutting back on red meat and bread.Exercise only related to work and playing with his dog  Marital status: Married. Relevant history for alcohol use is:  Social History   Substance and Sexual Activity  Alcohol Use Yes  . Alcohol/week: 6.0 standard drinks  . Types: 6 Cans of beer per week   Comment: 05/10/2015 "I only drink on the weekends; down from 12 to 6 cans of beer"   Relevant history for tobacco use is:  Social History   Tobacco Use  Smoking Status Former Smoker  . Packs/day: 0.50  . Years: 49.00  . Pack years: 24.50  . Types: Cigarettes  . Quit date: 04/13/2015  . Years since quitting: 4.8  Smokeless Tobacco Never Used  Tobacco  Comment   Currently use Vapor  . Past Medical History:  Diagnosis Date  . CAD (coronary artery disease)   . Constipation   . ED (erectile dysfunction)   . Hyperlipidemia   . Hypertension   . Pain in limb   . Peripheral vascular disease ([facility])    claudication     Family History  Problem Relation Age of Onset  . Dementia Mother   . Heart failure Mother   . Heart attack Brother      Current Outpatient Medications:  .  acetaminophen (TYLENOL) 325 MG tablet, Take 2 tablets (650 mg total) by mouth every 4 (four) hours as needed for headache or mild pain., Disp: , Rfl:  .  aspirin 81 MG chewable tablet, Chew 1 tablet (81 mg total) by mouth daily., Disp: , Rfl:  .  atorvastatin (LIPITOR) 40 MG tablet, Take 1 tablet (40 mg total) by mouth daily. Need appointment for future refills, Disp: 90 tablet, Rfl: 0 .  calcium carbonate (OSCAL) 1500 (600 Ca) MG TABS tablet, Take 600 mg of elemental calcium by mouth every other day., Disp: , Rfl:  .  clopidogrel (PLAVIX) 75 MG tablet, TAKE 1 TABLET(75 MG) BY MOUTH DAILY. Please make annual appt with Dr. Gwenlyn Found for refills. Thank you, Disp: 90 tablet, Rfl: 0 .  cyclobenzaprine (FLEXERIL) 10 MG tablet, Take 1 tablet (10 mg total) by mouth 3 (three) times daily as needed for muscle spasms., Disp: 30 tablet, Rfl: 0 .  diclofenac sodium (VOLTAREN) 1 %  GEL, Apply 2 g topically 4 (four) times daily., Disp: 100 g, Rfl: 3 .  MAGNESIUM OXIDE PO, Take 500 mg by mouth daily. , Disp: , Rfl:  .  Multiple Vitamins-Minerals (ONE-A-DAY MENS 50+ ADVANTAGE) TABS, Take 1 tablet by mouth daily., Disp: , Rfl:  .  olmesartan-hydrochlorothiazide (BENICAR HCT) 20-12.5 MG tablet, TAKE 1 TABLET BY MOUTH EVERY DAY, Disp: 90 tablet, Rfl: 0   No Known Allergies   Review of Systems  Constitutional: Negative.  Negative for malaise/fatigue.  HENT: Negative.   Eyes: Negative.   Respiratory: Negative.   Cardiovascular: Negative.  Negative for chest pain, palpitations, leg swelling  and PND.  Gastrointestinal: Negative.   Endocrine: Negative.   Genitourinary: Negative.   Musculoskeletal: Negative.   Skin: Negative.   Neurological: Negative.   Hematological: Negative.  Negative for adenopathy. Does not bruise/bleed easily.  Psychiatric/Behavioral: Negative.      Today's Vitals   02/08/20 0850  BP: 112/72  Pulse: 83  Temp: 98.2 F (36.8 C)  Weight: 153 lb 9.6 oz (69.7 kg)  Height: 5\' 8"  (1.727 m)  PainSc: 0-No pain   Body mass index is 23.35 kg/m.   Objective:  Physical Exam Vitals reviewed.  Constitutional:      Appearance: Normal appearance. He is obese.  HENT:     Head: Normocephalic and atraumatic.     Right Ear: Tympanic membrane, ear canal and external ear normal. There is no impacted cerumen.     Left Ear: External ear normal. There is impacted cerumen (hard wax present to canal).     Nose:     Comments: Deferred - masked    Mouth/Throat:     Comments: Deferred - masked Eyes:     Extraocular Movements: Extraocular movements intact.     Conjunctiva/sclera: Conjunctivae normal.     Pupils: Pupils are equal, round, and reactive to light.  Cardiovascular:     Rate and Rhythm: Normal rate and regular rhythm.     Pulses: Normal pulses.     Heart sounds: Normal heart sounds. No murmur.  Pulmonary:     Effort: Pulmonary effort is normal. No respiratory distress.     Breath sounds: Normal breath sounds.  Abdominal:     General: Abdomen is flat. Bowel sounds are normal. There is no distension.     Palpations: Abdomen is soft.  Genitourinary:    Prostate: Normal.     Rectum: Guaiac result negative.  Musculoskeletal:        General: Normal range of motion.     Cervical back: Normal range of motion and neck supple. No tenderness.  Skin:    General: Skin is warm.     Capillary Refill: Capillary refill takes less than 2 seconds.  Neurological:     General: No focal deficit present.     Mental Status: He is alert and oriented to person, place,  and time.  Psychiatric:        Mood and Affect: Mood normal.        Behavior: Behavior normal.        Thought Content: Thought content normal.        Judgment: Judgment normal.         Assessment And Plan:     1. Encounter for routine adult medical examination . Behavior modifications discussed and diet history reviewed.   . Pt will continue to exercise regularly and modify diet with low GI, plant based foods and decrease intake of processed foods.  . Recommend  intake of daily multivitamin, Vitamin D, and calcium.  . Recommend colonoscopy (will request records from Dr. Collene Mares) for preventive screenings, as well as recommend immunizations that include influenza, TDAP, and Shingles (up to date) - CBC - Comprehensive metabolic panel  2. Encounter for hepatitis C screening test for low risk patient  Will check for Hepatitis C screening due to being born between the years [birth year] - Hepatitis C antibody screen  3. Essential hypertension . B/P is well controlled.  . CMP ordered to check renal function.  . The importance of regular exercise and dietary modification was stressed to the patient.  . Stressed importance of losing ten percent of her body weight to help with B/P control.  . The weight loss would help with decreasing cardiac and cancer risk as well.  - EKG 12-Lead - POCT Urinalysis Dipstick (81002) - POCT UA - Microalbumin  4. Encounter for prostate cancer screening  - PSA(Must document that pt has been informed of limitations of PSA testing.)  5. Mixed hyperlipidemia  Chronic, stable  Continue with current medications - Lipid panel  6. Peripheral vascular disease (HCC)  Chronic, continue with plavix  Continue with follow up with Vascular   7. Impacted cerumen of left ear  Attempted to remove with lighted curette, will remove with water lavage  [name], FNP    THE PATIENT IS ENCOURAGED TO PRACTICE SOCIAL DISTANCING DUE TO THE COVID-19 PANDEMIC.

## 2020-02-08 NOTE — Patient Instructions (Signed)
Tyrone Walton , Thank you for taking time to come for your Medicare Wellness Visit. I appreciate your ongoing commitment to your health goals. Please review the following plan we discussed and let me know if I can assist you in the future.   Screening recommendations/referrals: Colonoscopy: requesting report Recommended yearly ophthalmology/optometry visit for glaucoma screening and checkup Recommended yearly dental visit for hygiene and checkup  Vaccinations: Influenza vaccine: 05/2019 Pneumococcal vaccine: 05/2016 Tdap vaccine: 03/2016 Shingles vaccine: discussed    Advanced directives: Advance directive discussed with you today. Even though you declined this today please call our office should you change your mind and we can give you the proper paperwork for you to fill out.   Conditions/risks identified: none  Next appointment: 02/13/2021 at 8:30  Preventive Care 65 Years and Older, Male Preventive care refers to lifestyle choices and visits with your health care provider that can promote health and wellness. What does preventive care include?  A yearly physical exam. This is also called an annual well check.  Dental exams once or twice a year.  Routine eye exams. Ask your health care provider how often you should have your eyes checked.  Personal lifestyle choices, including:  Daily care of your teeth and gums.  Regular physical activity.  Eating a healthy diet.  Avoiding tobacco and drug use.  Limiting alcohol use.  Practicing safe sex.  Taking low doses of aspirin every day.  Taking vitamin and mineral supplements as recommended by your health care provider. What happens during an annual well check? The services and screenings done by your health care provider during your annual well check will depend on your age, overall health, lifestyle risk factors, and family history of disease. Counseling  Your health care provider may ask you questions about your:  Alcohol  use.  Tobacco use.  Drug use.  Emotional well-being.  Home and relationship well-being.  Sexual activity.  Eating habits.  History of falls.  Memory and ability to understand (cognition).  Work and work Statistician. Screening  You may have the following tests or measurements:  Height, weight, and BMI.  Blood pressure.  Lipid and cholesterol levels. These may be checked every 5 years, or more frequently if you are over 39 years old.  Skin check.  Lung cancer screening. You may have this screening every year starting at age 69 if you have a 30-pack-year history of smoking and currently smoke or have quit within the past 15 years.  Fecal occult blood test (FOBT) of the stool. You may have this test every year starting at age 20.  Flexible sigmoidoscopy or colonoscopy. You may have a sigmoidoscopy every 5 years or a colonoscopy every 10 years starting at age 67.  Prostate cancer screening. Recommendations will vary depending on your family history and other risks.  Hepatitis C blood test.  Hepatitis B blood test.  Sexually transmitted disease (STD) testing.  Diabetes screening. This is done by checking your blood sugar (glucose) after you have not eaten for a while (fasting). You may have this done every 1-3 years.  Abdominal aortic aneurysm (AAA) screening. You may need this if you are a current or former smoker.  Osteoporosis. You may be screened starting at age 39 if you are at high risk. Talk with your health care provider about your test results, treatment options, and if necessary, the need for more tests. Vaccines  Your health care provider may recommend certain vaccines, such as:  Influenza vaccine. This is recommended every  year.  Tetanus, diphtheria, and acellular pertussis (Tdap, Td) vaccine. You may need a Td booster every 10 years.  Zoster vaccine. You may need this after age 42.  Pneumococcal 13-valent conjugate (PCV13) vaccine. One dose is  recommended after age 19.  Pneumococcal polysaccharide (PPSV23) vaccine. One dose is recommended after age 72. Talk to your health care provider about which screenings and vaccines you need and how often you need them. This information is not intended to replace advice given to you by your health care provider. Make sure you discuss any questions you have with your health care provider. Document Released: 10/26/2015 Document Revised: 06/18/2016 Document Reviewed: 07/31/2015 Elsevier Interactive Patient Education  2017 [employer] Prevention in the Home Falls can cause injuries. They can happen to people of all ages. There are many things you can do to make your home safe and to help prevent falls. What can I do on the outside of my home?  Regularly fix the edges of walkways and driveways and fix any cracks.  Remove anything that might make you trip as you walk through a door, such as a raised step or threshold.  Trim any bushes or trees on the path to your home.  Use bright outdoor lighting.  Clear any walking paths of anything that might make someone trip, such as rocks or tools.  Regularly check to see if handrails are loose or broken. Make sure that both sides of any steps have handrails.  Any raised decks and porches should have guardrails on the edges.  Have any leaves, snow, or ice cleared regularly.  Use sand or salt on walking paths during winter.  Clean up any spills in your garage right away. This includes oil or grease spills. What can I do in the bathroom?  Use night lights.  Install grab bars by the toilet and in the tub and shower. Do not use towel bars as grab bars.  Use non-skid mats or decals in the tub or shower.  If you need to sit down in the shower, use a plastic, non-slip stool.  Keep the floor dry. Clean up any water that spills on the floor as soon as it happens.  Remove soap buildup in the tub or shower regularly.  Attach bath mats  securely with double-sided non-slip rug tape.  Do not have throw rugs and other things on the floor that can make you trip. What can I do in the bedroom?  Use night lights.  Make sure that you have a light by your bed that is easy to reach.  Do not use any sheets or blankets that are too big for your bed. They should not hang down onto the floor.  Have a firm chair that has side arms. You can use this for support while you get dressed.  Do not have throw rugs and other things on the floor that can make you trip. What can I do in the kitchen?  Clean up any spills right away.  Avoid walking on wet floors.  Keep items that you use a lot in easy-to-reach places.  If you need to reach something above you, use a strong step stool that has a grab bar.  Keep electrical cords out of the way.  Do not use floor polish or wax that makes floors slippery. If you must use wax, use non-skid floor wax.  Do not have throw rugs and other things on the floor that can make you trip. What can  I do with my stairs?  Do not leave any items on the stairs.  Make sure that there are handrails on both sides of the stairs and use them. Fix handrails that are broken or loose. Make sure that handrails are as long as the stairways.  Check any carpeting to make sure that it is firmly attached to the stairs. Fix any carpet that is loose or worn.  Avoid having throw rugs at the top or bottom of the stairs. If you do have throw rugs, attach them to the floor with carpet tape.  Make sure that you have a light switch at the top of the stairs and the bottom of the stairs. If you do not have them, ask someone to add them for you. What else can I do to help prevent falls?  Wear shoes that:  Do not have high heels.  Have rubber bottoms.  Are comfortable and fit you well.  Are closed at the toe. Do not wear sandals.  If you use a stepladder:  Make sure that it is fully opened. Do not climb a closed  stepladder.  Make sure that both sides of the stepladder are locked into place.  Ask someone to hold it for you, if possible.  Clearly mark and make sure that you can see:  Any grab bars or handrails.  First and last steps.  Where the edge of each step is.  Use tools that help you move around (mobility aids) if they are needed. These include:  Canes.  Walkers.  Scooters.  Crutches.  Turn on the lights when you go into a dark area. Replace any light bulbs as soon as they burn out.  Set up your furniture so you have a clear path. Avoid moving your furniture around.  If any of your floors are uneven, fix them.  If there are any pets around you, be aware of where they are.  Review your medicines with your doctor. Some medicines can make you feel dizzy. This can increase your chance of falling. Ask your doctor what other things that you can do to help prevent falls. This information is not intended to replace advice given to you by your health care provider. Make sure you discuss any questions you have with your health care provider. Document Released: 07/26/2009 Document Revised: 03/06/2016 Document Reviewed: 11/03/2014 Elsevier Interactive Patient Education  2017 [employer].

## 2020-02-08 NOTE — Patient Instructions (Addendum)
 Health Maintenance, Male Adopting a healthy lifestyle and getting preventive care are important in promoting health and wellness. Ask your health care provider about:  The right schedule for you to have regular tests and exams.  Things you can do on your own to prevent diseases and keep yourself healthy. What should I know about diet, weight, and exercise? Eat a healthy diet   Eat a diet that includes plenty of vegetables, fruits, low-fat dairy products, and lean protein.  Do not eat a lot of foods that are high in solid fats, added sugars, or sodium. Maintain a healthy weight Body mass index (BMI) is a measurement that can be used to identify possible weight problems. It estimates body fat based on height and weight. Your health care provider can help determine your BMI and help you achieve or maintain a healthy weight. Get regular exercise Get regular exercise. This is one of the most important things you can do for your health. Most adults should:  Exercise for at least 150 minutes each week. The exercise should increase your heart rate and make you sweat (moderate-intensity exercise).  Do strengthening exercises at least twice a week. This is in addition to the moderate-intensity exercise.  Spend less time sitting. Even light physical activity can be beneficial. Watch cholesterol and blood lipids Have your blood tested for lipids and cholesterol at 69 years of age, then have this test every 5 years. You may need to have your cholesterol levels checked more often if:  Your lipid or cholesterol levels are high.  You are older than 69 years of age.  You are at high risk for heart disease. What should I know about cancer screening? Many types of cancers can be detected early and may often be prevented. Depending on your health history and family history, you may need to have cancer screening at various ages. This may include screening for:  Colorectal cancer.  Prostate  cancer.  Skin cancer.  Lung cancer. What should I know about heart disease, diabetes, and high blood pressure? Blood pressure and heart disease  High blood pressure causes heart disease and increases the risk of stroke. This is more likely to develop in people who have high blood pressure readings, are of African descent, or are overweight.  Talk with your health care provider about your target blood pressure readings.  Have your blood pressure checked: ? Every 3-5 years if you are 18-39 years of age. ? Every year if you are 40 years old or older.  If you are between the ages of 65 and 75 and are a current or former smoker, ask your health care provider if you should have a one-time screening for abdominal aortic aneurysm (AAA). Diabetes Have regular diabetes screenings. This checks your fasting blood sugar level. Have the screening done:  Once every three years after age 45 if you are at a normal weight and have a low risk for diabetes.  More often and at a younger age if you are overweight or have a high risk for diabetes. What should I know about preventing infection? Hepatitis B If you have a higher risk for hepatitis B, you should be screened for this virus. Talk with your health care provider to find out if you are at risk for hepatitis B infection. Hepatitis C Blood testing is recommended for:  Everyone born from 1945 through 1965.  Anyone with known risk factors for hepatitis C. Sexually transmitted infections (STIs)  You should be screened each   for STIs, including gonorrhea and chlamydia, if: ? You are sexually active and are younger than 69 years of age. ? You are older than 69 years of age and your health care provider tells you that you are at risk for this type of infection. ? Your sexual activity has changed since you were last screened, and you are at increased risk for chlamydia or gonorrhea. Ask your health care provider if you are at risk.  Ask your  health care provider about whether you are at high risk for HIV. Your health care provider may recommend a prescription medicine to help prevent HIV infection. If you choose to take medicine to prevent HIV, you should first get tested for HIV. You should then be tested every 3 months for as long as you are taking the medicine. Follow these instructions at home: Lifestyle  Do not use any products that contain nicotine or tobacco, such as cigarettes, e-cigarettes, and chewing tobacco. If you need help quitting, ask your health care provider.  Do not use street drugs.  Do not share needles.  Ask your health care provider for help if you need support or information about quitting drugs. Alcohol use  Do not drink alcohol if your health care provider tells you not to drink.  If you drink alcohol: ? Limit how much you have to 0-2 drinks a day. ? Be aware of how much alcohol is in your drink. In the [place] one drink equals one 12 oz bottle of beer (355 mL), one 5 oz glass of wine (148 mL), or one 1 oz glass of hard liquor (44 mL). General instructions  Schedule regular health, dental, and eye exams.  Stay current with your vaccines.  Tell your health care provider if: ? You often feel depressed. ? You have ever been abused or do not feel safe at home. Summary  Adopting a healthy lifestyle and getting preventive care are important in promoting health and wellness.  Follow your health care provider's instructions about healthy diet, exercising, and getting tested or screened for diseases.  Follow your health care provider's instructions on monitoring your cholesterol and blood pressure. This information is not intended to replace advice given to you by your health care provider. Make sure you discuss any questions you have with your health care provider. Document Revised: 09/22/2018 Document Reviewed: 09/22/2018 Elsevier Patient Education  2020 Else[employer] Inc[employer]    Earwax Buildup,  Adult The ears produce a substance called earwax that helps keep bacteria out of the ear and protects the skin in the ear canal. Occasionally, earwax can build up in the ear and cause discomfort or hearing loss. What increases the risk? This condition is more likely to develop in people who:  Are male.  Are elderly.  Naturally produce more earwax.  Clean their ears often with cotton swabs.  Use earplugs often.  Use in-ear headphones often.  Wear hearing aids.  Have narrow ear canals.  Have earwax that is overly thick or sticky.  Have eczema.  Are dehydrated.  Have excess hair in the ear canal. What are the signs or symptoms? Symptoms of this condition include:  Reduced or muffled hearing.  A feeling of fullness in the ear or feeling that the ear is plugged.  Fluid coming from the ear.  Ear pain.  Ear itch.  Ringing in the ear.  Coughing.  An obvious piece of earwax that can be seen inside the ear canal. How is this diagnosed? This condition may be   diagnosed based on:  Your symptoms.  Your medical history.  An ear exam. During the exam, your health care provider will look into your ear with an instrument called an otoscope. You may have tests, including a hearing test. How is this treated? This condition may be treated by:  Using ear drops to soften the earwax.  Having the earwax removed by a health care provider. The health care provider may: ? Flush the ear with water. ? Use an instrument that has a loop on the end (curette). ? Use a suction device.  Surgery to remove the wax buildup. This may be done in severe cases. Follow these instructions at home:   Take over-the-counter and prescription medicines only as told by your health care provider.  Do not put any objects, including cotton swabs, into your ear. You can clean the opening of your ear canal with a washcloth or facial tissue.  Follow instructions from your health care provider about  cleaning your ears. Do not over-clean your ears.  Drink enough fluid to keep your urine clear or pale yellow. This will help to thin the earwax.  Keep all follow-up visits as told by your health care provider. If earwax builds up in your ears often or if you use hearing aids, consider seeing your health care provider for routine, preventive ear cleanings. Ask your health care provider how often you should schedule your cleanings.  If you have hearing aids, clean them according to instructions from the manufacturer and your health care provider. Contact a health care provider if:  You have ear pain.  You develop a fever.  You have blood, pus, or other fluid coming from your ear.  You have hearing loss.  You have ringing in your ears that does not go away.  Your symptoms do not improve with treatment.  You feel like the room is spinning (vertigo). Summary  Earwax can build up in the ear and cause discomfort or hearing loss.  The most common symptoms of this condition include reduced or muffled hearing and a feeling of fullness in the ear or feeling that the ear is plugged.  This condition may be diagnosed based on your symptoms, your medical history, and an ear exam.  This condition may be treated by using ear drops to soften the earwax or by having the earwax removed by a health care provider.  Do not put any objects, including cotton swabs, into your ear. You can clean the opening of your ear canal with a washcloth or facial tissue. This information is not intended to replace advice given to you by your health care provider. Make sure you discuss any questions you have with your health care provider. Document Revised: 09/11/2017 Document Reviewed: 12/10/2016 Elsevier Patient Education  2020 Else[employer] Inc.  

## 2020-02-09 LAB — PSA: Prostate Specific Ag, Serum: 1.6 ng/mL (ref 0.0–4.0)

## 2020-02-09 LAB — CBC
Hematocrit: 44.6 % (ref 37.5–51.0)
Hemoglobin: 14.5 g/dL (ref 13.0–17.7)
MCH: 29.3 pg (ref 26.6–33.0)
MCHC: 32.5 g/dL (ref 31.5–35.7)
MCV: 90 fL (ref 79–97)
Platelets: 208 10*3/uL (ref 150–450)
RBC: 4.95 x10E6/uL (ref 4.14–5.80)
RDW: 12.9 % (ref 11.6–15.4)
WBC: 6.1 10*3/uL (ref 3.4–10.8)

## 2020-02-09 LAB — COMPREHENSIVE METABOLIC PANEL
ALT: 32 IU/L (ref 0–44)
AST: 28 IU/L (ref 0–40)
Albumin/Globulin Ratio: 1.8 (ref 1.2–2.2)
Albumin: 4.4 g/dL (ref 3.8–4.8)
Alkaline Phosphatase: 77 IU/L (ref 39–117)
BUN/Creatinine Ratio: 16 (ref 10–24)
BUN: 16 mg/dL (ref 8–27)
Bilirubin Total: 0.5 mg/dL (ref 0.0–1.2)
CO2: 22 mmol/L (ref 20–29)
Calcium: 9.7 mg/dL (ref 8.6–10.2)
Chloride: 103 mmol/L (ref 96–106)
Creatinine, Ser: 1.01 mg/dL (ref 0.76–1.27)
GFR calc Af Amer: 87 mL/min/{1.73_m2} (ref >59)
GFR calc non Af Amer: 76 mL/min/{1.73_m2} (ref >59)
Globulin, Total: 2.5 g/dL (ref 1.5–4.5)
Glucose: 95 mg/dL (ref 65–99)
Potassium: 4.3 mmol/L (ref 3.5–5.2)
Sodium: 139 mmol/L (ref 134–144)
Total Protein: 6.9 g/dL (ref 6.0–8.5)

## 2020-02-09 LAB — LIPID PANEL
Chol/HDL Ratio: 2.6 ratio (ref 0.0–5.0)
Cholesterol, Total: 168 mg/dL (ref 100–199)
HDL: 65 mg/dL (ref >39)
LDL Chol Calc (NIH): 87 mg/dL (ref 0–99)
Triglycerides: 89 mg/dL (ref 0–149)
VLDL Cholesterol Cal: 16 mg/dL (ref 5–40)

## 2020-02-09 LAB — HEPATITIS C ANTIBODY: Hep C Virus Ab: <0.1 s/co ratio (ref 0.0–0.9)

## 2020-02-11 ENCOUNTER — Other Ambulatory Visit: Payer: Self-pay | Admitting: Nurse Practitioner

## 2020-02-11 ENCOUNTER — Other Ambulatory Visit: Payer: Self-pay | Admitting: Cardiovascular Disease

## 2020-02-14 ENCOUNTER — Other Ambulatory Visit: Payer: Self-pay | Admitting: Cardiovascular Disease

## 2020-02-20 ENCOUNTER — Encounter: Payer: Self-pay | Admitting: Nurse Practitioner

## 2020-02-28 ENCOUNTER — Other Ambulatory Visit: Payer: Self-pay

## 2020-02-28 MED ORDER — ATORVASTATIN CALCIUM 40 MG PO TABS: 40.0000 mg | ORAL_TABLET | Freq: Every day | ORAL | 0 refills | Status: DC

## 2020-03-31 ENCOUNTER — Other Ambulatory Visit: Payer: Self-pay | Admitting: Cardiovascular Disease

## 2020-04-23 ENCOUNTER — Encounter: Payer: Self-pay | Admitting: Nurse Practitioner

## 2020-05-09 ENCOUNTER — Ambulatory Visit: Payer: Medicare PPO | Admitting: Nurse Practitioner

## 2020-05-09 ENCOUNTER — Other Ambulatory Visit: Payer: Self-pay

## 2020-05-09 ENCOUNTER — Encounter: Payer: Self-pay | Admitting: Nurse Practitioner

## 2020-05-09 VITALS — BP 118/76 | HR 69 | Temp 97.9°F | Ht 68.0 in | Wt 155.0 lb

## 2020-05-09 DIAGNOSIS — Z720 Tobacco use: Secondary | ICD-10-CM | POA: Diagnosis not present

## 2020-05-09 DIAGNOSIS — I10 Essential (primary) hypertension: Secondary | ICD-10-CM | POA: Diagnosis not present

## 2020-05-09 DIAGNOSIS — I739 Peripheral vascular disease, unspecified: Secondary | ICD-10-CM

## 2020-05-09 DIAGNOSIS — Z7982 Long term (current) use of aspirin: Secondary | ICD-10-CM

## 2020-05-09 MED ORDER — CHANTIX STARTING MONTH PAK 0.5 MG X 11 & 1 MG X 42 PO TABS: ORAL_TABLET | ORAL | 0 refills | Status: DC

## 2020-05-09 NOTE — Patient Instructions (Signed)
Coping with Quitting Smoking - Patient is vaping  Quitting smoking is a physical and mental challenge. You will face cravings, withdrawal symptoms, and temptation. Before quitting, work with your health care provider to make a plan that can help you cope. Preparation can help you quit and keep you from giving in. How can I cope with cravings? Cravings usually last for 5-10 minutes. If you get through it, the craving will pass. Consider taking the following actions to help you cope with cravings:  Keep your mouth busy: ? Chew sugar-free gum. ? Suck on hard candies or a straw. ? Brush your teeth.  Keep your hands and body busy: ? Immediately change to a different activity when you feel a craving. ? Squeeze or play with a ball. ? Do an activity or a hobby, like making bead jewelry, practicing needlepoint, or working with wood. ? Mix up your normal routine. ? Take a short exercise break. Go for a quick walk or run up and down stairs. ? Spend time in public places where smoking is not allowed.  Focus on doing something kind or helpful for someone else.  Call a friend or family member to talk during a craving.  Join a support group.  Call a quit line, such as 1-800-QUIT-NOW.  Talk with your health care provider about medicines that might help you cope with cravings and make quitting easier for you. How can I deal with withdrawal symptoms? Your body may experience negative effects as it tries to get used to not having nicotine in the system. These effects are called withdrawal symptoms. They may include:  Feeling hungrier than normal.  Trouble concentrating.  Irritability.  Trouble sleeping.  Feeling depressed.  Restlessness and agitation.  Craving a cigarette. To manage withdrawal symptoms:  Avoid places, people, and activities that trigger your cravings.  Remember why you want to quit.  Get plenty of sleep.  Avoid coffee and other caffeinated drinks. These may worsen  some of your symptoms. How can I handle social situations? Social situations can be difficult when you are quitting smoking, especially in the first few weeks. To manage this, you can:  Avoid parties, bars, and other social situations where people might be smoking.  Avoid alcohol.  Leave right away if you have the urge to smoke.  Explain to your family and friends that you are quitting smoking. Ask for understanding and support.  Plan activities with friends or family where smoking is not an option. What are some ways I can cope with stress? Wanting to smoke may cause stress, and stress can make you want to smoke. Find ways to manage your stress. Relaxation techniques can help. For example:  Breathe slowly and deeply, in through your nose and out through your mouth.  Listen to soothing, relaxing music.  Talk with a family member or friend about your stress.  Light a candle.  Soak in a bath or take a shower.  Think about a peaceful place. What are some ways I can prevent weight gain? Be aware that many people gain weight after they quit smoking. However, not everyone does. To keep from gaining weight, have a plan in place before you quit and stick to the plan after you quit. Your plan should include:  Having healthy snacks. When you have a craving, it may help to: ? Eat plain popcorn, crunchy carrots, celery, or other cut vegetables. ? Chew sugar-free gum.  Changing how you eat: ? Eat small portion sizes at meals. ?  Eat 4-6 small meals throughout the day instead of 1-2 large meals a day. ? Be mindful when you eat. Do not watch television or do other things that might distract you as you eat.  Exercising regularly: ? Make time to exercise each day. If you do not have time for a long workout, do short bouts of exercise for 5-10 minutes several times a day. ? Do some form of strengthening exercise, like weight lifting, and some form of aerobic exercise, like running or  swimming.  Drinking plenty of water or other low-calorie or no-calorie drinks. Drink 6-8 glasses of water daily, or as much as instructed by your health care provider. Summary  Quitting smoking is a physical and mental challenge. You will face cravings, withdrawal symptoms, and temptation to smoke again. Preparation can help you as you go through these challenges.  You can cope with cravings by keeping your mouth busy (such as by chewing gum), keeping your body and hands busy, and making calls to family, friends, or a helpline for people who want to quit smoking.  You can cope with withdrawal symptoms by avoiding places where people smoke, avoiding drinks with caffeine, and getting plenty of rest.  Ask your health care provider about the different ways to prevent weight gain, avoid stress, and handle social situations. This information is not intended to replace advice given to you by your health care provider. Make sure you discuss any questions you have with your health care provider. Document Revised: 09/11/2017 Document Reviewed: 09/26/2016 Elsevier Patient Education  2020 [employer].

## 2020-05-09 NOTE — Progress Notes (Signed)
This visit occurred during the SARS-CoV-2 public health emergency.  Safety protocols were in place, including screening questions prior to the visit, additional usage of staff PPE, and extensive cleaning of exam room while observing appropriate contact time as indicated for disinfecting solutions.  Subjective:     Patient ID: Tyrone Walton , male    DOB: 10/31/1950 , 69 y.o.   MRN: [id number]   Chief Complaint  Patient presents with  . Hypertension    HPI  Presents today for three month HTN follow up. He is doing well and is taking his blood pressure medications as directed. He denies any CP, headache, SOB. He denies having any issues with his claudication with no pain complaints. 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 He is interested in smoking cessation and would like to restart Chantix. He states that he took it with some successes four years ago.  He sees the vascular doctor once a year with his last appointment being last year. He also has yearly ultrasounds for survelliance.  Hypertension This is a chronic problem. The current episode started more than 1 year ago. Pertinent negatives include no chest pain, headaches, neck pain, palpitations or shortness of breath. Risk factors for coronary artery disease include smoking/tobacco exposure, male gender, sedentary lifestyle, dyslipidemia and family history. Past treatments include angiotensin blockers and diuretics. The current treatment provides moderate improvement. There are no compliance problems.  Hypertensive end-organ damage includes PVD. There is no history of coarctation of the aorta, a hypertension causing med or sleep apnea.     Past Medical History:  Diagnosis Date  . CAD (coronary artery disease)   . Constipation   . ED (erectile dysfunction)   . Hyperlipidemia   . Hypertension   . Pain in limb   . Peripheral vascular disease (HCC)    claudication      Family History  Problem Relation Age of Onset  . Dementia Mother   . Heart failure Mother   . Heart attack Brother      Current Outpatient Medications:  .  acetaminophen (TYLENOL) 325 MG tablet, Take 2 tablets (650 mg total) by mouth every 4 (four) hours as needed for headache or mild pain., Disp: , Rfl:  .  aspirin 81 MG chewable tablet, Chew 1 tablet (81 mg total) by mouth daily., Disp: , Rfl:  .  atorvastatin (LIPITOR) 40 MG tablet, Take 1 tablet (40 mg total) by mouth daily. Need appointment for future refills, Disp: 90 tablet, Rfl: 0 .  calcium carbonate (OSCAL) 1500 (600 Ca) MG TABS tablet, Take 600 mg of elemental calcium by mouth every other day., Disp: , Rfl:  .  clopidogrel (PLAVIX) 75 MG tablet, Take 1 tablet (75 mg total) by mouth daily. NEED OV., Disp: 30 tablet, Rfl: 2 .  cyclobenzaprine (FLEXERIL) 10 MG tablet, Take 1 tablet (10 mg total) by mouth 3 (three) times daily as needed for muscle spasms., Disp: 30 tablet, Rfl: 0 .  diclofenac sodium (VOLTAREN) 1 % GEL, Apply 2 g topically 4 (four) times daily., Disp: 100 g, Rfl: 3 .  MAGNESIUM OXIDE PO, Take 500 mg by mouth daily. , Disp: , Rfl:  .  Multiple Vitamins-Minerals (ONE-A-DAY MENS 50+ ADVANTAGE) TABS, Take 1 tablet by mouth daily., Disp: , Rfl:  .  olmesartan-hydrochlorothiazide (BENICAR HCT) 20-12.5 MG tablet, TAKE 1 TABLET BY MOUTH EVERY DAY, Disp: 90 tablet, Rfl: 0 .  varenicline (  CHANTIX STARTING MONTH PAK) 0.5 MG X 11 & 1 MG X 42 tablet, Take one 0.5 mg tablet by mouth once daily for 3 days, then increase to one 0.5 mg tablet twice daily for 4 days, then increase to one 1 mg tablet twice daily., Disp: 53 tablet, Rfl: 0   No Known Allergies   Review of Systems  Constitutional: Negative.   HENT: Negative.   Eyes: Negative.   Respiratory: Negative.  Negative for shortness of breath.   Cardiovascular: Negative for chest pain, palpitations and leg swelling.  Gastrointestinal: Negative.   Endocrine: Negative.    Genitourinary: Negative.   Musculoskeletal: Positive for arthralgias. Negative for neck pain and neck stiffness.       He has chronic neck pain  Skin: Negative.   Allergic/Immunologic: Negative.   Neurological: Negative.  Negative for dizziness and headaches.  Psychiatric/Behavioral: Negative.      Today's Vitals   05/09/20 0847  BP: 118/76  Pulse: 69  Temp: 97.9 F (36.6 C)  TempSrc: Oral  Weight: 155 lb (70.3 kg)  Height: 5\' 8"  (1.727 m)  PainSc: 0-No pain   Body mass index is 23.57 kg/m.   Objective:  Physical Exam Vitals reviewed.  Constitutional:      General: He is not in acute distress.    Appearance: Normal appearance. He is normal weight.  Neck:     Vascular: No carotid bruit.  Cardiovascular:     Rate and Rhythm: Normal rate and regular rhythm.     Pulses: Normal pulses.     Heart sounds: Normal heart sounds. No murmur heard.  No gallop.   Pulmonary:     Effort: Pulmonary effort is normal. No respiratory distress.     Breath sounds: Normal breath sounds. No wheezing.  Musculoskeletal:        General: Normal range of motion.     Cervical back: Normal range of motion. No rigidity or tenderness.     Right lower leg: No edema.     Left lower leg: No edema.  Skin:    General: Skin is warm and dry.     Capillary Refill: Capillary refill takes less than 2 seconds.  Neurological:     General: No focal deficit present.     Mental Status: He is alert and oriented to person, place, and time. Mental status is at baseline.  Psychiatric:        Mood and Affect: Mood normal.        Behavior: Behavior normal.        Thought Content: Thought content normal.        Judgment: Judgment normal.         Assessment And Plan:     1. Essential hypertension . B/P is well controlled.  . No labs this visit . The importance of regular exercise and dietary modification was stressed to the patient. We discussed exercising at least 150 minutes per week and he is encouraged  to begin. . Encouraged to quit vaping  2. Peripheral vascular disease (HCC)  Chronic, being followed by Vascular   3. Tobacco abuse  He is interested in chantix to help him to quit vaping, sent starter dose, he is to follow up in 4 weeks and to set a goal quit date. - varenicline (CHANTIX STARTING MONTH PAK) 0.5 MG X 11 & 1 MG X 42 tablet; Take one 0.5 mg tablet by mouth once daily for 3 days, then increase to one 0.5 mg tablet twice daily  for 4 days, then increase to one 1 mg tablet twice daily.  Dispense: 53 tablet; Refill: 0     Patient was given opportunity to ask questions. Patient verbalized understanding of the plan and was able to repeat key elements of the plan. All questions were answered to their satisfaction.  [name], FNP   I, [name], FNP, have reviewed all documentation for this visit. The documentation on 05/09/20 for the exam, diagnosis, procedures, and orders are all accurate and complete.  THE PATIENT IS ENCOURAGED TO PRACTICE SOCIAL DISTANCING DUE TO THE COVID-19 PANDEMIC.

## 2020-05-11 ENCOUNTER — Other Ambulatory Visit: Payer: Self-pay | Admitting: Cardiovascular Disease

## 2020-05-11 ENCOUNTER — Other Ambulatory Visit: Payer: Self-pay | Admitting: Nurse Practitioner

## 2020-06-04 ENCOUNTER — Other Ambulatory Visit: Payer: Self-pay | Admitting: Cardiovascular Disease

## 2020-06-05 ENCOUNTER — Other Ambulatory Visit: Payer: Self-pay

## 2020-06-05 DIAGNOSIS — Z720 Tobacco use: Secondary | ICD-10-CM

## 2020-06-05 MED ORDER — CHANTIX STARTING MONTH PAK 0.5 MG X 11 & 1 MG X 42 PO TABS: ORAL_TABLET | ORAL | 0 refills | Status: DC

## 2020-06-06 ENCOUNTER — Ambulatory Visit: Payer: Medicare PPO | Admitting: Nurse Practitioner

## 2020-07-04 ENCOUNTER — Ambulatory Visit: Payer: Medicare PPO | Admitting: Nurse Practitioner

## 2020-07-04 ENCOUNTER — Other Ambulatory Visit: Payer: Self-pay

## 2020-07-04 ENCOUNTER — Encounter: Payer: Self-pay | Admitting: Nurse Practitioner

## 2020-07-04 DIAGNOSIS — R059 Cough, unspecified: Secondary | ICD-10-CM

## 2020-07-04 DIAGNOSIS — Z23 Encounter for immunization: Secondary | ICD-10-CM

## 2020-07-04 DIAGNOSIS — Z720 Tobacco use: Secondary | ICD-10-CM

## 2020-07-04 DIAGNOSIS — R05 Cough: Secondary | ICD-10-CM | POA: Diagnosis not present

## 2020-07-04 MED ORDER — BUPROPION HCL ER (XL) 150 MG PO TB24: 150.0000 mg | ORAL_TABLET | ORAL | 2 refills | Status: DC

## 2020-07-04 MED ORDER — SPIRIVA HANDIHALER 18 MCG IN CAPS: 18.0000 ug | ORAL_CAPSULE | Freq: Every day | RESPIRATORY_TRACT | 2 refills | Status: DC

## 2020-07-04 MED ORDER — CLOPIDOGREL BISULFATE 75 MG PO TABS: 75.0000 mg | ORAL_TABLET | Freq: Every day | ORAL | 1 refills | Status: DC

## 2020-07-04 NOTE — Progress Notes (Signed)
I,Yamilka Roman [employer] as a Education administrator for [employer], FNP.,have documented all relevant documentation on the behalf of [name], FNP,as directed by  [name], FNP while in the presence of [name], [place].  This visit occurred during the SARS-CoV-2 public health emergency.  Safety protocols were in place, including screening questions prior to the visit, additional usage of staff PPE, and extensive cleaning of exam room while observing appropriate contact time as indicated for disinfecting solutions.  Subjective:     Patient ID: Tyrone Walton , male    DOB: [birth year] , 69 y.o.   MRN: [id number]   Chief Complaint  Patient presents with  . med check    patient has been unable to take chantix due to it being taken off the market     HPI  He is here today due to not being able to get Chantix.  He is currently vaping with 3% nicotine for quite some time. He is willing to try another medication to help him to quit smoking.   Reports he tripped up the step and a little "off balance" the other day.      Past Medical History:  Diagnosis Date  . CAD (coronary artery disease)   . Constipation   . ED (erectile dysfunction)   . Hyperlipidemia   . Hypertension   . Pain in limb   . Peripheral vascular disease (HCC)    claudication     Family History  Problem Relation Age of Onset  . Dementia Mother   . Heart failure Mother   . Heart attack Brother      Current Outpatient Medications:  .  acetaminophen (TYLENOL) 325 MG tablet, Take 2 tablets (650 mg total) by mouth every 4 (four) hours as needed for headache or mild pain., Disp: , Rfl:  .  calcium carbonate (OSCAL) 1500 (600 Ca) MG TABS tablet, Take 600 mg of elemental calcium by mouth every other day., Disp: , Rfl:  .  clopidogrel (PLAVIX) 75 MG tablet, Take 1 tablet (75 mg total) by mouth daily., Disp: 90 tablet, Rfl: 1 .  cyclobenzaprine (FLEXERIL) 10 MG tablet, Take 1 tablet (10 mg total) by mouth 3 (three) times  daily as needed for muscle spasms., Disp: 30 tablet, Rfl: 0 .  diclofenac sodium (VOLTAREN) 1 % GEL, Apply 2 g topically 4 (four) times daily., Disp: 100 g, Rfl: 3 .  MAGNESIUM OXIDE PO, Take 500 mg by mouth daily. , Disp: , Rfl:  .  Multiple Vitamins-Minerals (ONE-A-DAY MENS 50+ ADVANTAGE) TABS, Take 1 tablet by mouth daily., Disp: , Rfl:  .  olmesartan-hydrochlorothiazide (BENICAR HCT) 20-12.5 MG tablet, TAKE 1 TABLET BY MOUTH EVERY DAY, Disp: 90 tablet, Rfl: 1 .  aspirin 81 MG chewable tablet, Chew 1 tablet (81 mg total) by mouth daily. (Patient not taking: Reported on 06/05/2020), Disp: , Rfl:  .  atorvastatin (LIPITOR) 40 MG tablet, TAKE 1 TABLET(40 MG) BY MOUTH DAILY, Disp: 30 tablet, Rfl: 0 .  buPROPion (WELLBUTRIN XL) 150 MG 24 hr tablet, Take 1 tablet (150 mg total) by mouth every morning., Disp: 30 tablet, Rfl: 2 .  tiotropium (SPIRIVA HANDIHALER) 18 MCG inhalation capsule, Place 1 capsule (18 mcg total) into inhaler and inhale daily., Disp: 30 capsule, Rfl: 2   No Known Allergies   Review of Systems  Constitutional: Negative.   Respiratory: Negative.   Cardiovascular: Negative.   Neurological: Positive for dizziness (off balance briefly for a few seconds after coming out the door). Negative  for headaches.  Psychiatric/Behavioral: Negative.      There were no vitals filed for this visit. There is no height or weight on file to calculate BMI.   Objective:  Physical Exam Vitals reviewed.  Constitutional:      General: He is not in acute distress.    Appearance: Normal appearance. He is normal weight.  Neck:     Vascular: No carotid bruit.  Cardiovascular:     Rate and Rhythm: Normal rate and regular rhythm.     Pulses: Normal pulses.     Heart sounds: Normal heart sounds. No murmur heard.  No gallop.   Pulmonary:     Effort: Pulmonary effort is normal. No respiratory distress.     Breath sounds: Normal breath sounds. No wheezing.  Musculoskeletal:     Cervical back: No  rigidity or tenderness.     Right lower leg: No edema.     Left lower leg: No edema.  Skin:    General: Skin is warm and dry.     Capillary Refill: Capillary refill takes less than 2 seconds.  Neurological:     General: No focal deficit present.     Mental Status: He is alert and oriented to person, place, and time. Mental status is at baseline.     Cranial Nerves: No cranial nerve deficit.  Psychiatric:        Mood and Affect: Mood normal.        Behavior: Behavior normal.        Thought Content: Thought content normal.        Judgment: Judgment normal.         Assessment And Plan:     1. Tobacco abuse  He is unable to get chantix due to a recall, will start him on wellbutrin  Discussed side effects.   Will check a low dose CT scan of his lungs.   - buPROPion (WELLBUTRIN XL) 150 MG 24 hr tablet; Take 1 tablet (150 mg total) by mouth every morning.  Dispense: 30 tablet; Refill: 2 - CT CHEST LUNG CA SCREEN LOW DOSE W/O CM; Future  2. Need for influenza vaccination  Influenza vaccine administered  Encouraged to take Tylenol as needed for fever or muscle aches. - Flu Vaccine QUAD High Dose(Fluad)  3. Cough  Will start him on spiriva because he has a persistent early morning cough and is likely related to chronic bronchitis - CT CHEST LUNG CA SCREEN LOW DOSE W/O CM; Future - tiotropium (SPIRIVA HANDIHALER) 18 MCG inhalation capsule; Place 1 capsule (18 mcg total) into inhaler and inhale daily.  Dispense: 30 capsule; Refill: 2    Patient was given opportunity to ask questions. Patient verbalized understanding of the plan and was able to repeat key elements of the plan. All questions were answered to their satisfaction.   Teola Bradley, FNP, have reviewed all documentation for this visit. The documentation on 08/16/20 for the exam, diagnosis, procedures, and orders are all accurate and complete.   THE PATIENT IS ENCOURAGED TO PRACTICE SOCIAL DISTANCING DUE TO THE COVID-19  PANDEMIC.

## 2020-07-05 ENCOUNTER — Ambulatory Visit: Payer: Medicare PPO | Admitting: Nurse Practitioner

## 2020-07-19 ENCOUNTER — Other Ambulatory Visit: Payer: Self-pay

## 2020-07-19 ENCOUNTER — Ambulatory Visit
Admission: RE | Admit: 2020-07-19 | Discharge: 2020-07-19 | Disposition: A | Discharge status: Home / Self Care | Payer: Medicare Other | Source: Ambulatory Visit | Attending: Nurse Practitioner | Admitting: Nurse Practitioner

## 2020-07-19 DIAGNOSIS — Z720 Tobacco use: Secondary | ICD-10-CM

## 2020-07-19 DIAGNOSIS — I700 Atherosclerosis of aorta: Secondary | ICD-10-CM | POA: Diagnosis not present

## 2020-07-19 DIAGNOSIS — Z87891 Personal history of nicotine dependence: Secondary | ICD-10-CM | POA: Diagnosis not present

## 2020-07-19 DIAGNOSIS — J432 Centrilobular emphysema: Secondary | ICD-10-CM | POA: Diagnosis not present

## 2020-07-19 DIAGNOSIS — R059 Cough, unspecified: Secondary | ICD-10-CM

## 2020-07-19 DIAGNOSIS — I2510 Atherosclerotic heart disease of native coronary artery without angina pectoris: Secondary | ICD-10-CM | POA: Diagnosis not present

## 2020-08-09 ENCOUNTER — Other Ambulatory Visit: Payer: Self-pay | Admitting: Cardiovascular Disease

## 2020-09-11 ENCOUNTER — Other Ambulatory Visit: Payer: Self-pay

## 2020-09-11 ENCOUNTER — Encounter: Payer: Self-pay | Admitting: Nurse Practitioner

## 2020-09-11 ENCOUNTER — Ambulatory Visit: Payer: Medicare PPO | Admitting: Nurse Practitioner

## 2020-09-11 VITALS — BP 132/76 | HR 77 | Temp 98.2°F | Ht 67.8 in | Wt 156.0 lb

## 2020-09-11 DIAGNOSIS — I700 Atherosclerosis of aorta: Secondary | ICD-10-CM

## 2020-09-11 DIAGNOSIS — I10 Essential (primary) hypertension: Secondary | ICD-10-CM | POA: Diagnosis not present

## 2020-09-11 DIAGNOSIS — Z720 Tobacco use: Secondary | ICD-10-CM | POA: Diagnosis not present

## 2020-09-11 MED ORDER — BUPROPION HCL ER (XL) 150 MG PO TB24: 150.0000 mg | ORAL_TABLET | ORAL | 1 refills | Status: DC

## 2020-09-11 NOTE — Progress Notes (Signed)
I,[name],acting as a Education administrator for [employer], NP.,have documented all relevant documentation on the behalf of [employer], NP,as directed by  Bary Castilla, NP while in the presence of Bary Castilla, NP.  This visit occurred during the SARS-CoV-2 public health emergency.  Safety protocols were in place, including screening questions prior to the visit, additional usage of staff PPE, and extensive cleaning of exam room while observing appropriate contact time as indicated for disinfecting solutions.  Subjective:     Patient ID: Tyrone Walton , male    DOB: 29-Dec-1950 , 69 y.o.   MRN: [id number]   Chief Complaint  Patient presents with  . Hypertension    HPI  Patient is here for hypertension follow up.  He had a low dose CT in October which showed atherosclerosis of aorta. He is being followed by Dr. Gwenlyn Found.  He has no concerns at this time. He reports compliance with medications.   He is not watching what he eats or following any diet at this time.  He is still vaping and not smoking. And using bupropion which has helped him. He requested refill      Hypertension This is a chronic problem. The current episode started more than 1 year ago. The problem is unchanged. The problem is controlled. Pertinent negatives include no anxiety, chest pain, malaise/fatigue, palpitations, peripheral edema or PND. There are no associated agents to hypertension. Risk factors for coronary artery disease include sedentary lifestyle and male gender. Past treatments include diuretics. The current treatment provides significant improvement. There are no compliance problems.  There is no history of angina or kidney disease. There is no history of chronic renal disease.     Past Medical History:  Diagnosis Date  . CAD (coronary artery disease)   . Constipation   . ED (erectile dysfunction)   . Hyperlipidemia   . Hypertension   . Pain in limb   . Peripheral vascular disease (HCC)     claudication     Family History  Problem Relation Age of Onset  . Dementia Mother   . Heart failure Mother   . Heart attack Brother      Current Outpatient Medications:  .  acetaminophen (TYLENOL) 325 MG tablet, Take 2 tablets (650 mg total) by mouth every 4 (four) hours as needed for headache or mild pain., Disp: , Rfl:  .  aspirin 81 MG chewable tablet, Chew 1 tablet (81 mg total) by mouth daily., Disp: , Rfl:  .  atorvastatin (LIPITOR) 40 MG tablet, TAKE 1 TABLET(40 MG) BY MOUTH DAILY, Disp: 30 tablet, Rfl: 0 .  buPROPion (WELLBUTRIN XL) 150 MG 24 hr tablet, Take 1 tablet (150 mg total) by mouth every morning., Disp: 30 tablet, Rfl: 1 .  calcium carbonate (OSCAL) 1500 (600 Ca) MG TABS tablet, Take 600 mg of elemental calcium by mouth every other day., Disp: , Rfl:  .  clopidogrel (PLAVIX) 75 MG tablet, Take 1 tablet (75 mg total) by mouth daily., Disp: 90 tablet, Rfl: 1 .  cyclobenzaprine (FLEXERIL) 10 MG tablet, Take 1 tablet (10 mg total) by mouth 3 (three) times daily as needed for muscle spasms., Disp: 30 tablet, Rfl: 0 .  diclofenac sodium (VOLTAREN) 1 % GEL, Apply 2 g topically 4 (four) times daily., Disp: 100 g, Rfl: 3 .  MAGNESIUM OXIDE PO, Take 500 mg by mouth daily. , Disp: , Rfl:  .  Multiple Vitamins-Minerals (ONE-A-DAY MENS 50+ ADVANTAGE) TABS, Take 1 tablet by mouth daily., Disp: ,  Rfl:  .  olmesartan-hydrochlorothiazide (BENICAR HCT) 20-12.5 MG tablet, TAKE 1 TABLET BY MOUTH EVERY DAY, Disp: 90 tablet, Rfl: 1 .  tiotropium (SPIRIVA HANDIHALER) 18 MCG inhalation capsule, Place 1 capsule (18 mcg total) into inhaler and inhale daily., Disp: 30 capsule, Rfl: 2   No Known Allergies   Review of Systems  Constitutional: Negative.  Negative for fatigue, fever and malaise/fatigue.  Eyes: Negative for pain, redness and visual disturbance.  Respiratory: Negative.  Negative for cough and wheezing.   Cardiovascular: Negative.  Negative for chest pain, palpitations and PND.   Gastrointestinal: Negative.   Neurological: Negative.      Today's Vitals   09/11/20 0912  BP: 132/76  Pulse: 77  Temp: 98.2 F (36.8 C)  TempSrc: Oral  Weight: 156 lb (70.8 kg)  Height: 5' 7.8" (1.722 m)   Body mass index is 23.86 kg/m.  Wt Readings from Last 3 Encounters:  09/11/20 156 lb (70.8 kg)  05/09/20 155 lb (70.3 kg)  02/08/20 153 lb (69.4 kg)     Objective:  Physical Exam Constitutional:      Appearance: Normal appearance.  Eyes:     Pupils: Pupils are equal, round, and reactive to light.  Cardiovascular:     Rate and Rhythm: Normal rate and regular rhythm.     Pulses: Normal pulses.     Heart sounds: Normal heart sounds.  Pulmonary:     Effort: Pulmonary effort is normal. No respiratory distress.     Breath sounds: Normal breath sounds. No wheezing.  Neurological:     Mental Status: He is alert.         Assessment And Plan:     1. Essential hypertension -BP well controlled  -Shows compliance with medication  -CBC, Lipid, BMP today  -Encouraged patient to start eating healthy, watch his salt intake and increase water intake.  -Importance of exercise for atleast 150 min. Per week.  -Recommended patient gets yearly eye exam -Benicar HCT 20-12.5 tablet   2. Atherosclerosis of aorta (HCC) -Lipid panel  -Recommended patient to eliminate high fatty foods, fast food, dairy products and also to quit smoking (he currently vapes). -Continue to follow up with Dr. Gwenlyn Found (vascular)  -Atorvastatin 40 mg once daily   3. Tobacco abuse -Refilled Bupropion.  -Patient currently does not smoke but vapes and the bupropion has helped.  -Encouraged to quit vaping.     Patient encouraged to come in if he has any further questions or concerns.  Patient comes in May 2022 for his physical exam.   Patient was given opportunity to ask questions. Patient verbalized understanding of the plan and was able to repeat key elements of the plan. All questions were answered  to their satisfaction.  Bary Castilla, NP   I, Bary Castilla, NP, have reviewed all documentation for this visit. The documentation on 09/11/20 for the exam, diagnosis, procedures, and orders are all accurate and complete.  THE PATIENT IS ENCOURAGED TO PRACTICE SOCIAL DISTANCING DUE TO THE COVID-19 PANDEMIC.

## 2020-09-11 NOTE — Patient Instructions (Signed)

## 2020-09-12 LAB — LIPID PANEL
Chol/HDL Ratio: 2.8 ratio (ref 0.0–5.0)
Cholesterol, Total: 216 mg/dL — ABNORMAL HIGH (ref 100–199)
HDL: 77 mg/dL (ref >39)
LDL Chol Calc (NIH): 122 mg/dL — ABNORMAL HIGH (ref 0–99)
Triglycerides: 97 mg/dL (ref 0–149)
VLDL Cholesterol Cal: 17 mg/dL (ref 5–40)

## 2020-09-12 LAB — CMP14+EGFR
ALT: 26 IU/L (ref 0–44)
AST: 26 IU/L (ref 0–40)
Albumin/Globulin Ratio: 1.8 (ref 1.2–2.2)
Albumin: 4.6 g/dL (ref 3.8–4.8)
Alkaline Phosphatase: 70 IU/L (ref 44–121)
BUN/Creatinine Ratio: 14 (ref 10–24)
BUN: 14 mg/dL (ref 8–27)
Bilirubin Total: 0.5 mg/dL (ref 0.0–1.2)
CO2: 22 mmol/L (ref 20–29)
Calcium: 9.4 mg/dL (ref 8.6–10.2)
Chloride: 102 mmol/L (ref 96–106)
Creatinine, Ser: 1.03 mg/dL (ref 0.76–1.27)
GFR calc Af Amer: 85 mL/min/{1.73_m2} (ref >59)
GFR calc non Af Amer: 74 mL/min/{1.73_m2} (ref >59)
Globulin, Total: 2.6 g/dL (ref 1.5–4.5)
Glucose: 99 mg/dL (ref 65–99)
Potassium: 4.2 mmol/L (ref 3.5–5.2)
Sodium: 140 mmol/L (ref 134–144)
Total Protein: 7.2 g/dL (ref 6.0–8.5)

## 2020-09-12 LAB — CBC
Hematocrit: 42.8 % (ref 37.5–51.0)
Hemoglobin: 14.4 g/dL (ref 13.0–17.7)
MCH: 30 pg (ref 26.6–33.0)
MCHC: 33.6 g/dL (ref 31.5–35.7)
MCV: 89 fL (ref 79–97)
Platelets: 204 10*3/uL (ref 150–450)
RBC: 4.8 x10E6/uL (ref 4.14–5.80)
RDW: 12.8 % (ref 11.6–15.4)
WBC: 5.8 10*3/uL (ref 3.4–10.8)

## 2020-10-03 ENCOUNTER — Other Ambulatory Visit: Payer: Self-pay | Admitting: Cardiovascular Disease

## 2020-10-06 ENCOUNTER — Other Ambulatory Visit: Payer: Self-pay | Admitting: Nurse Practitioner

## 2020-10-06 DIAGNOSIS — R059 Cough, unspecified: Secondary | ICD-10-CM

## 2020-10-21 ENCOUNTER — Other Ambulatory Visit: Payer: Self-pay | Admitting: Nurse Practitioner

## 2020-10-21 DIAGNOSIS — Z720 Tobacco use: Secondary | ICD-10-CM

## 2020-10-30 ENCOUNTER — Other Ambulatory Visit: Payer: Self-pay | Admitting: Cardiovascular Disease

## 2020-11-07 ENCOUNTER — Other Ambulatory Visit: Payer: Self-pay | Admitting: Nurse Practitioner

## 2020-12-04 ENCOUNTER — Other Ambulatory Visit: Payer: Self-pay | Admitting: Cardiovascular Disease

## 2021-01-17 IMAGING — CT CT CHEST LUNG CANCER SCREENING LOW DOSE W/O CM
1 series · 10 of 10 positions shown, 13 images · non-contrast
Comparison: 07/10/2017 chest radiograph.  No prior CT.

CLINICAL DATA: Seventy-five pack-year smoking history, quitting 3
years ago.

EXAM:
CT CHEST WITHOUT CONTRAST LOW-DOSE FOR LUNG CANCER SCREENING
TECHNIQUE: Multidetector CT imaging of the chest was performed following the
standard protocol without IV contrast.

[ct lung segmentation data · axial · 0.71mm/px · z∈[-384,-384]mm · 10 of 352 frames shown]
[frame 1/352  mediastinal]
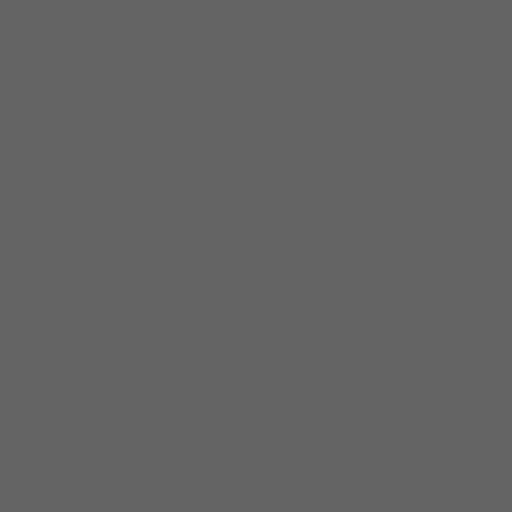
[frame 1/352  lung]
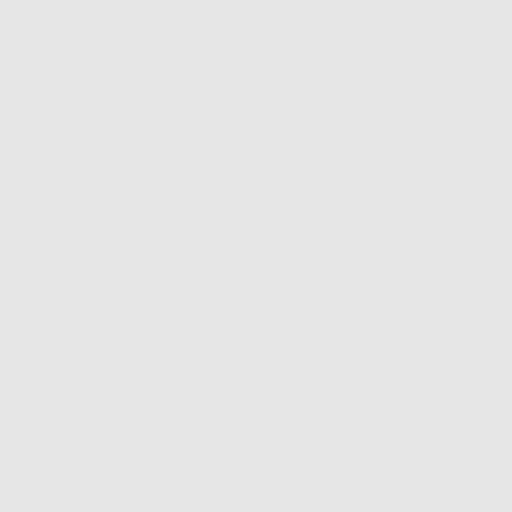
[frame 40/352  lung]
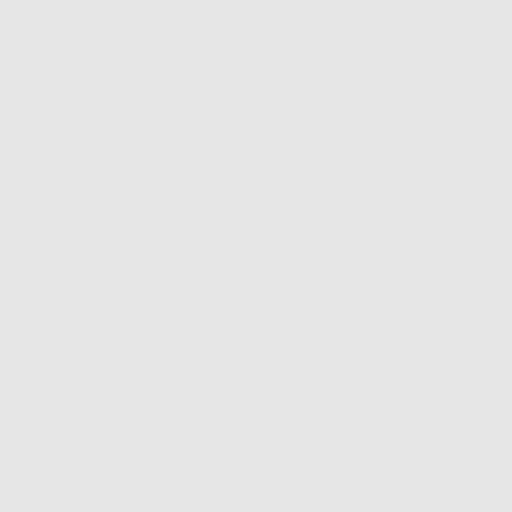
[frame 79/352  lung]
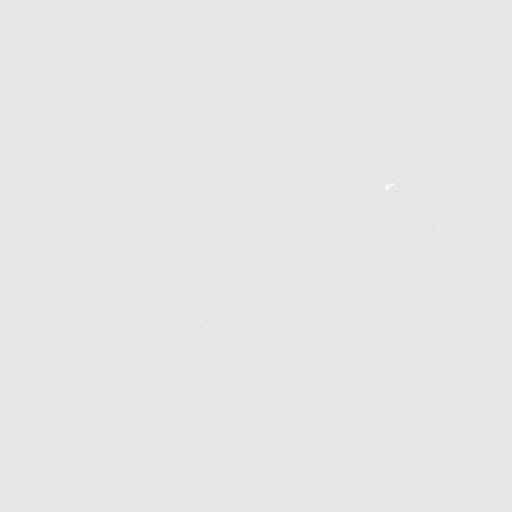
[frame 118/352  lung]
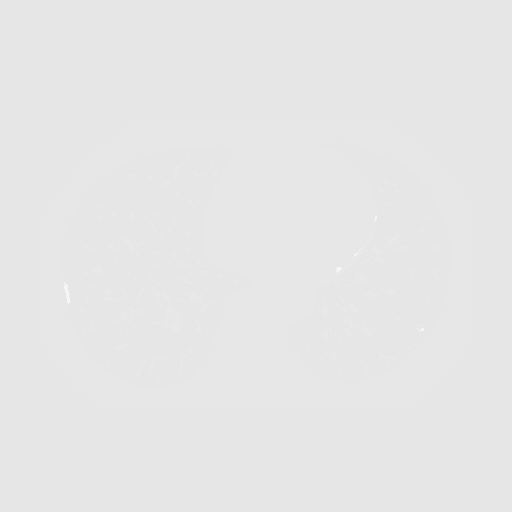
[frame 157/352  mediastinal]
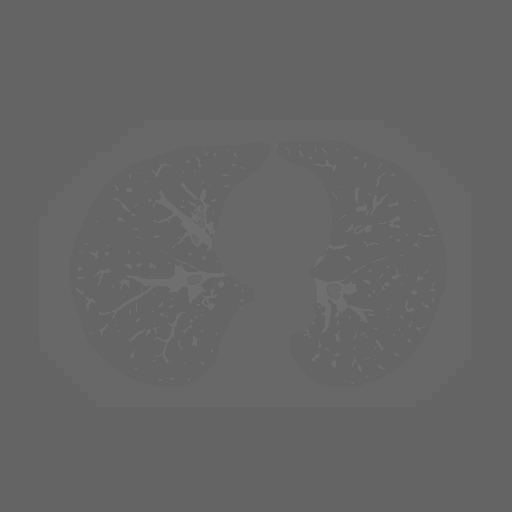
[frame 157/352  lung]
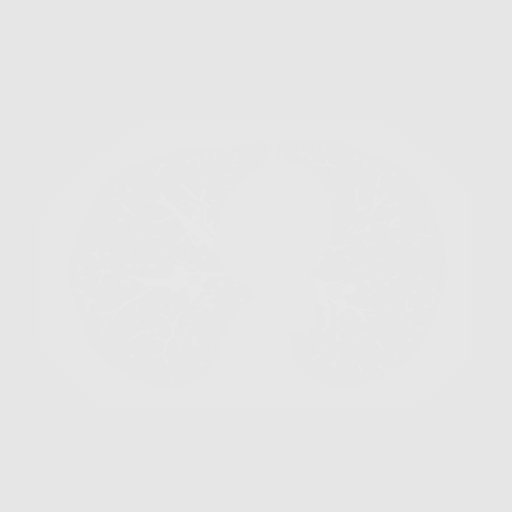
[frame 196/352  lung]
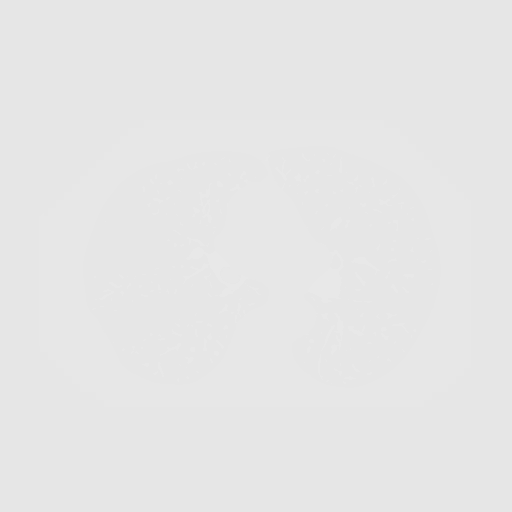
[frame 235/352  lung]
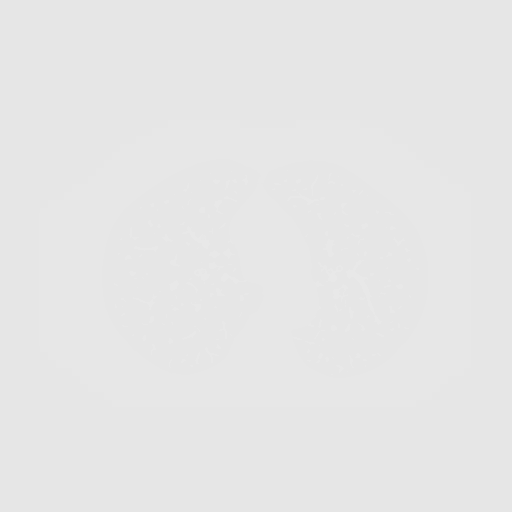
[frame 274/352  lung]
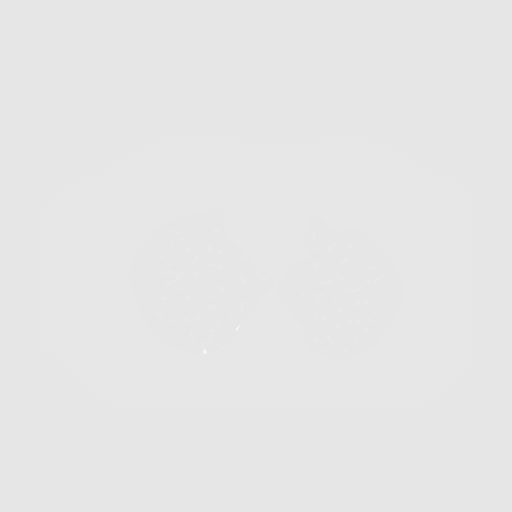
[frame 313/352  mediastinal]
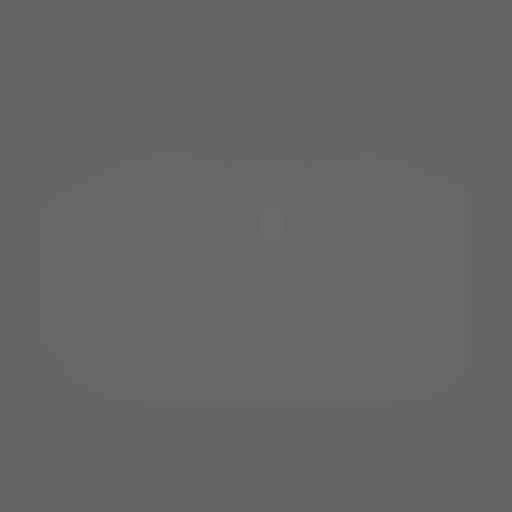
[frame 313/352  lung]
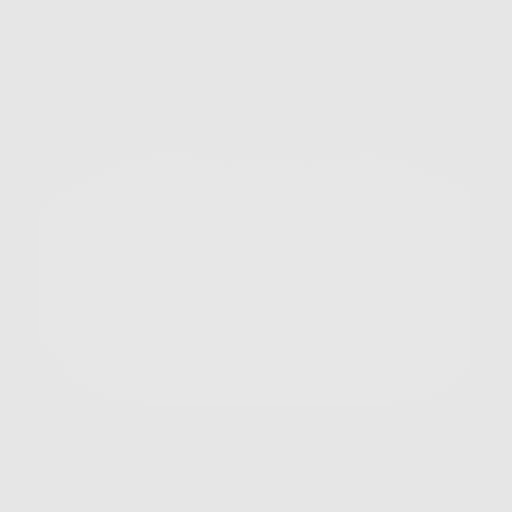
[frame 352/352  lung]
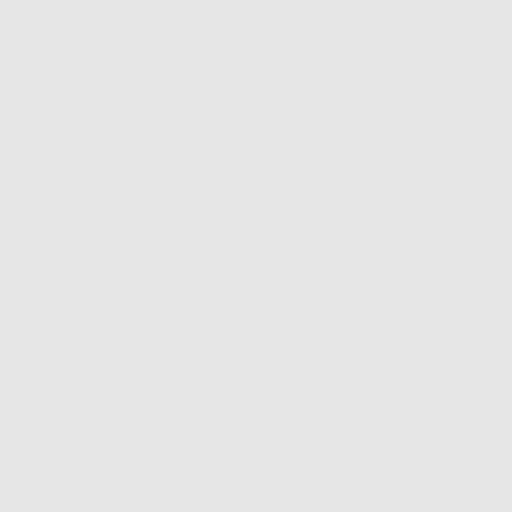

[10 of 10 positions shown; findings below may reference images not displayed]

FINDINGS: Cardiovascular: Aortic atherosclerosis. Tortuous thoracic aorta.
Normal heart size, without pericardial effusion. Multivessel
coronary artery atherosclerosis.

Mediastinum/Nodes: No mediastinal or definite hilar adenopathy,
given limitations of unenhanced CT.

Lungs/Pleura: No pleural fluid. Mild centrilobular and paraseptal
emphysema. Presumed secretions within the left mainstem and upper
lobe bronchi.

Pulmonary nodules of maximally volume derived equivalent diameter
5.4 mm in the subpleural left upper lobe.

Upper Abdomen: Normal imaged portions of the liver, spleen, stomach,
pancreas, gallbladder adrenal glands, kidneys.

Musculoskeletal: Lower cervical spondylosis. Mild convex right
thoracolumbar spine curvature.
IMPRESSION: 1. Lung-RADS 2, benign appearance or behavior. Continue annual
screening with low-dose chest CT without contrast in 12 months.
2. Aortic Atherosclerosis (9GKKZ-QTL.L) and Emphysema (9GKKZ-[id number].A).
Coronary artery atherosclerosis.

## 2021-02-13 ENCOUNTER — Encounter: Payer: Self-pay | Admitting: Nurse Practitioner

## 2021-02-13 ENCOUNTER — Other Ambulatory Visit: Payer: Self-pay

## 2021-02-13 ENCOUNTER — Ambulatory Visit: Payer: Medicare PPO

## 2021-02-13 ENCOUNTER — Ambulatory Visit (INDEPENDENT_AMBULATORY_CARE_PROVIDER_SITE_OTHER): Payer: Medicare PPO | Admitting: Nurse Practitioner

## 2021-02-13 VITALS — BP 142/78 | HR 67 | Temp 97.5°F | Ht 68.8 in | Wt 161.4 lb

## 2021-02-13 DIAGNOSIS — H6122 Impacted cerumen, left ear: Secondary | ICD-10-CM

## 2021-02-13 DIAGNOSIS — I10 Essential (primary) hypertension: Secondary | ICD-10-CM

## 2021-02-13 DIAGNOSIS — Z0000 Encounter for general adult medical examination without abnormal findings: Secondary | ICD-10-CM

## 2021-02-13 DIAGNOSIS — Z125 Encounter for screening for malignant neoplasm of prostate: Secondary | ICD-10-CM | POA: Diagnosis not present

## 2021-02-13 DIAGNOSIS — J439 Emphysema, unspecified: Secondary | ICD-10-CM | POA: Diagnosis not present

## 2021-02-13 DIAGNOSIS — E782 Mixed hyperlipidemia: Secondary | ICD-10-CM

## 2021-02-13 DIAGNOSIS — I70213 Atherosclerosis of native arteries of extremities with intermittent claudication, bilateral legs: Secondary | ICD-10-CM | POA: Diagnosis not present

## 2021-02-13 DIAGNOSIS — R059 Cough, unspecified: Secondary | ICD-10-CM

## 2021-02-13 DIAGNOSIS — I700 Atherosclerosis of aorta: Secondary | ICD-10-CM

## 2021-02-13 DIAGNOSIS — Z79899 Other long term (current) drug therapy: Secondary | ICD-10-CM

## 2021-02-13 LAB — POCT URINALYSIS DIPSTICK
Bilirubin, UA: NEGATIVE
Blood, UA: NEGATIVE
Glucose, UA: NEGATIVE
Ketones, UA: NEGATIVE
Leukocytes, UA: NEGATIVE
Nitrite, UA: NEGATIVE
Protein, UA: NEGATIVE
Spec Grav, UA: 1.015 (ref 1.010–1.025)
Urobilinogen, UA: 0.2 E.U./dL
pH, UA: 6.5 (ref 5.0–8.0)

## 2021-02-13 LAB — POCT UA - MICROALBUMIN
Albumin/Creatinine Ratio, Urine, POC: 30
Creatinine, POC: 100 mg/dL
Microalbumin Ur, POC: 10 mg/L

## 2021-02-13 MED ORDER — SPIRIVA HANDIHALER 18 MCG IN CAPS: 18.0000 ug | ORAL_CAPSULE | Freq: Every day | RESPIRATORY_TRACT | 5 refills | Status: DC

## 2021-02-13 NOTE — Progress Notes (Signed)
This visit occurred during the SARS-CoV-2 public health emergency.  Safety protocols were in place, including screening questions prior to the visit, additional usage of staff PPE, and extensive cleaning of exam room while observing appropriate contact time as indicated for disinfecting solutions.  Subjective:     Patient ID: Tyrone Walton , male    DOB: 10/19/1950 , 70 y.o.   MRN: [id number]   Chief Complaint  Patient presents with  . Annual Exam    HPI  Here for HM.  Continues to work part time. He has not vaped since February 19th.     Past Medical History:  Diagnosis Date  . CAD (coronary artery disease)   . Constipation   . ED (erectile dysfunction)   . Hyperlipidemia   . Hypertension   . Pain in limb   . Peripheral vascular disease (HCC)    claudication     Family History  Problem Relation Age of Onset  . Dementia Mother   . Heart failure Mother   . Heart attack Brother      Current Outpatient Medications:  .  aspirin 81 MG chewable tablet, Chew 1 tablet (81 mg total) by mouth daily., Disp: , Rfl:  .  atorvastatin (LIPITOR) 40 MG tablet, TAKE 1 TABLET(40 MG) BY MOUTH DAILY, Disp: 60 tablet, Rfl: 1 .  buPROPion (WELLBUTRIN XL) 150 MG 24 hr tablet, TAKE 1 TABLET(150 MG) BY MOUTH EVERY MORNING, Disp: 30 tablet, Rfl: 1 .  calcium carbonate (OSCAL) 1500 (600 Ca) MG TABS tablet, Take 600 mg of elemental calcium by mouth every other day., Disp: , Rfl:  .  clopidogrel (PLAVIX) 75 MG tablet, TAKE 1 TABLET(75 MG) BY MOUTH DAILY, Disp: 90 tablet, Rfl: 1 .  cyclobenzaprine (FLEXERIL) 10 MG tablet, Take 1 tablet (10 mg total) by mouth 3 (three) times daily as needed for muscle spasms., Disp: 30 tablet, Rfl: 0 .  diclofenac sodium (VOLTAREN) 1 % GEL, Apply 2 g topically 4 (four) times daily., Disp: 100 g, Rfl: 3 .  MAGNESIUM OXIDE PO, Take 500 mg by mouth daily. , Disp: , Rfl:  .  Multiple Vitamins-Minerals (ONE-A-DAY MENS 50+ ADVANTAGE) TABS, Take 1 tablet by mouth daily.,  Disp: , Rfl:  .  olmesartan-hydrochlorothiazide (BENICAR HCT) 20-12.5 MG tablet, TAKE 1 TABLET BY MOUTH EVERY DAY, Disp: 90 tablet, Rfl: 1 .  vitamin B-12 (CYANOCOBALAMIN) 100 MCG tablet, Take 100 mcg by mouth daily., Disp: , Rfl:  .  acetaminophen (TYLENOL) 325 MG tablet, Take 2 tablets (650 mg total) by mouth every 4 (four) hours as needed for headache or mild pain., Disp: , Rfl:  .  tiotropium (SPIRIVA HANDIHALER) 18 MCG inhalation capsule, Place 1 capsule (18 mcg total) into inhaler and inhale daily., Disp: 30 capsule, Rfl: 5   No Known Allergies   Men's preventive visit. Patient Health Questionnaire (PHQ-2) is  [place] Office Visit from 02/13/2021 in [facility]  PHQ-2 Total Score 0     Patient is on a regular diet; he does try to choose healthier options.  He is not currently exercising.  Does want to start since he is gaining weight in his abdomen.  Marital status: Married. Relevant history for alcohol use is:  Social History   Substance and Sexual Activity  Alcohol Use Yes  . Alcohol/week: 6.0 standard drinks  . Types: 6 Cans of beer per week   Comment: 05/10/2015 "I only drink on the weekends; down from 12 to 6 cans of beer"  Relevant history for tobacco use is:  Social History   Tobacco Use  Smoking Status Former Smoker  . Packs/day: 0.50  . Years: 49.00  . Pack years: 24.50  . Types: Cigarettes  . Quit date: 04/13/2015  . Years since quitting: 5.8  Smokeless Tobacco Never Used  Tobacco Comment   Currently use Vapor, Quit vaping since December 01, 2020  .   Review of Systems  Constitutional: Negative.  Negative for fatigue and fever.  Eyes: Negative for pain, redness and visual disturbance.  Respiratory: Negative.  Negative for cough and wheezing.   Cardiovascular: Negative.  Negative for chest pain and palpitations.  Gastrointestinal: Negative.   Neurological: Negative.      Today's Vitals   02/13/21 0844  BP: (!) 142/78  Pulse: 67   Temp: (!) 97.5 F (36.4 C)  TempSrc: Oral  Weight: 161 lb 6.4 oz (73.2 kg)  Height: 5' 8.8" (1.748 m)  PainSc: 0-No pain   Body mass index is 23.97 kg/m.   Objective:  Physical Exam Vitals reviewed.  Constitutional:      Appearance: Normal appearance. He is obese.  HENT:     Head: Normocephalic and atraumatic.     Right Ear: Tympanic membrane, ear canal and external ear normal. There is no impacted cerumen.     Left Ear: External ear normal. There is no impacted cerumen (soft wax present - had to remove due to not being able to visualize canal well with lighted curette).     Nose:     Comments: Deferred - masked    Mouth/Throat:     Comments: Deferred - masked Eyes:     Extraocular Movements: Extraocular movements intact.     Conjunctiva/sclera: Conjunctivae normal.     Pupils: Pupils are equal, round, and reactive to light.  Cardiovascular:     Rate and Rhythm: Normal rate and regular rhythm.     Pulses: Normal pulses.     Heart sounds: Normal heart sounds. No murmur heard.   Pulmonary:     Effort: Pulmonary effort is normal. No respiratory distress.     Breath sounds: Normal breath sounds.  Abdominal:     General: Abdomen is flat. Bowel sounds are normal. There is no distension.     Palpations: Abdomen is soft. There is no mass.     Tenderness: There is no abdominal tenderness.  Genitourinary:    Comments: Prostate exam Deferred - will check PSA  Musculoskeletal:        General: No swelling or tenderness. Normal range of motion.     Cervical back: Normal range of motion and neck supple. No tenderness.  Skin:    General: Skin is warm.     Capillary Refill: Capillary refill takes less than 2 seconds.  Neurological:     General: No focal deficit present.     Mental Status: He is alert and oriented to person, place, and time.     Cranial Nerves: No cranial nerve deficit.  Psychiatric:        Mood and Affect: Mood normal.        Behavior: Behavior normal.         Thought Content: Thought content normal.        Judgment: Judgment normal.         Assessment And Plan:    1. Encounter for general adult medical examination w/o abnormal findings . Behavior modifications discussed and diet history reviewed.   . Pt will continue to exercise regularly  and modify diet with low GI, plant based foods and decrease intake of processed foods.  . Recommend intake of daily multivitamin, Vitamin D, and calcium.  . Recommend colonoscopy (up to date) for preventive screenings, as well as recommend immunizations that include influenza, TDAP (up to date)  2. Encounter for prostate cancer screening - PSA  3. Essential hypertension  Chronic, blood pressure is slightly elevated, no changes to medications this visit - POCT Urinalysis Dipstick (81002) - POCT UA - Microalbumin - EKG 12-Lead  4. Mixed hyperlipidemia  Chronic, controlled  Continue with current medications, tolerating medications well - Lipid panel - CMP14+EGFR  5. Aortic atherosclerosis (HCC)  Chronic, currently on statin and tolerating well  Confirmed on CT Lung scan 07/2020  6. Atherosclerosis of native artery of both lower extremities with intermittent claudication (HCC)  Chronic, continue with clopidogel  Continue follow up with Dr. Gwenlyn Found he will call for an appt  7. Pulmonary emphysema, unspecified emphysema type ([place])  This was confirmed with his CT low dose lung scan   Continue with Spiriva  He is no longer vaping as of December 01, 2020 - tiotropium (SPIRIVA HANDIHALER) 18 MCG inhalation capsule; Place 1 capsule (18 mcg total) into inhaler and inhale daily.  Dispense: 30 capsule; Refill: 5  8. Other long term (current) drug therapy - CBC  9. Excessive cerumen in left ear canal  Cleaned ears with lighted curette, soft wax removed    Patient was given opportunity to ask questions. Patient verbalized understanding of the plan and was able to repeat key elements of the  plan. All questions were answered to their satisfaction.   [name], FNP   I, [name], FNP, have reviewed all documentation for this visit. The documentation on 02/13/21 for the exam, diagnosis, procedures, and orders are all accurate and complete.   THE PATIENT IS ENCOURAGED TO PRACTICE SOCIAL DISTANCING DUE TO THE COVID-19 PANDEMIC.

## 2021-02-13 NOTE — Patient Instructions (Signed)
Health Maintenance After Age 70 After age 70, you are at a higher risk for certain long-term diseases and infections as well as injuries from falls. Falls are a major cause of broken bones and head injuries in people who are older than age 70. Getting regular preventive care can help to keep you healthy and well. Preventive care includes getting regular testing and making lifestyle changes as recommended by your health care provider. Talk with your health care provider about:  Which screenings and tests you should have. A screening is a test that checks for a disease when you have no symptoms.  A diet and exercise plan that is right for you. What should I know about screenings and tests to prevent falls? Screening and testing are the best ways to find a health problem early. Early diagnosis and treatment give you the best chance of managing medical conditions that are common after age 70. Certain conditions and lifestyle choices may make you more likely to have a fall. Your health care provider may recommend:  Regular vision checks. Poor vision and conditions such as cataracts can make you more likely to have a fall. If you wear glasses, make sure to get your prescription updated if your vision changes.  Medicine review. Work with your health care provider to regularly review all of the medicines you are taking, including over-the-counter medicines. Ask your health care provider about any side effects that may make you more likely to have a fall. Tell your health care provider if any medicines that you take make you feel dizzy or sleepy.  Osteoporosis screening. Osteoporosis is a condition that causes the bones to get weaker. This can make the bones weak and cause them to break more easily.  Blood pressure screening. Blood pressure changes and medicines to control blood pressure can make you feel dizzy.  Strength and balance checks. Your health care provider may recommend certain tests to check your  strength and balance while standing, walking, or changing positions.  Foot health exam. Foot pain and numbness, as well as not wearing proper footwear, can make you more likely to have a fall.  Depression screening. You may be more likely to have a fall if you have a fear of falling, feel emotionally low, or feel unable to do activities that you used to do.  Alcohol use screening. Using too much alcohol can affect your balance and may make you more likely to have a fall. What actions can I take to lower my risk of falls? General instructions  Talk with your health care provider about your risks for falling. Tell your health care provider if: ? You fall. Be sure to tell your health care provider about all falls, even ones that seem minor. ? You feel dizzy, sleepy, or off-balance.  Take over-the-counter and prescription medicines only as told by your health care provider. These include any supplements.  Eat a healthy diet and maintain a healthy weight. A healthy diet includes low-fat dairy products, low-fat (lean) meats, and fiber from whole grains, beans, and lots of fruits and vegetables. Home safety  Remove any tripping hazards, such as rugs, cords, and clutter.  Install safety equipment such as grab bars in bathrooms and safety rails on stairs.  Keep rooms and walkways well-lit. Activity  Follow a regular exercise program to stay fit. This will help you maintain your balance. Ask your health care provider what types of exercise are appropriate for you.  If you need a cane or walker,   use it as recommended by your health care provider.  Wear supportive shoes that have nonskid soles.   Lifestyle  Do not drink alcohol if your health care provider tells you not to drink.  If you drink alcohol, limit how much you have: ? 0-1 drink a day for women. ? 0-2 drinks a day for men.  Be aware of how much alcohol is in your drink. In the [place] one drink equals one typical bottle of beer (12  oz), one-half glass of wine (5 oz), or one shot of hard liquor (1 oz).  Do not use any products that contain nicotine or tobacco, such as cigarettes and e-cigarettes. If you need help quitting, ask your health care provider. Summary  Having a healthy lifestyle and getting preventive care can help to protect your health and wellness after age 70.  Screening and testing are the best way to find a health problem early and help you avoid having a fall. Early diagnosis and treatment give you the best chance for managing medical conditions that are more common for people who are older than age 70.  Falls are a major cause of broken bones and head injuries in people who are older than age 70. Take precautions to prevent a fall at home.  Work with your health care provider to learn what changes you can make to improve your health and wellness and to prevent falls. This information is not intended to replace advice given to you by your health care provider. Make sure you discuss any questions you have with your health care provider. Document Revised: 01/20/2019 Document Reviewed: 08/12/2017 [employer] Patient Education  2021 [employer]  

## 2021-02-14 DIAGNOSIS — Z125 Encounter for screening for malignant neoplasm of prostate: Secondary | ICD-10-CM | POA: Diagnosis not present

## 2021-02-14 DIAGNOSIS — Z79899 Other long term (current) drug therapy: Secondary | ICD-10-CM | POA: Diagnosis not present

## 2021-02-14 DIAGNOSIS — E782 Mixed hyperlipidemia: Secondary | ICD-10-CM | POA: Diagnosis not present

## 2021-02-15 LAB — CMP14+EGFR
ALT: 30 IU/L (ref 0–44)
AST: 26 IU/L (ref 0–40)
Albumin/Globulin Ratio: 2 (ref 1.2–2.2)
Albumin: 4.5 g/dL (ref 3.8–4.8)
Alkaline Phosphatase: 67 IU/L (ref 44–121)
BUN/Creatinine Ratio: 11 (ref 10–24)
BUN: 12 mg/dL (ref 8–27)
Bilirubin Total: 0.7 mg/dL (ref 0.0–1.2)
CO2: 22 mmol/L (ref 20–29)
Calcium: 9.4 mg/dL (ref 8.6–10.2)
Chloride: 100 mmol/L (ref 96–106)
Creatinine, Ser: 1.09 mg/dL (ref 0.76–1.27)
Globulin, Total: 2.2 g/dL (ref 1.5–4.5)
Glucose: 95 mg/dL (ref 65–99)
Potassium: 4.1 mmol/L (ref 3.5–5.2)
Sodium: 138 mmol/L (ref 134–144)
Total Protein: 6.7 g/dL (ref 6.0–8.5)
eGFR: 73 mL/min/{1.73_m2} (ref >59)

## 2021-02-15 LAB — LIPID PANEL
Chol/HDL Ratio: 3.1 ratio (ref 0.0–5.0)
Cholesterol, Total: 183 mg/dL (ref 100–199)
HDL: 59 mg/dL (ref >39)
LDL Chol Calc (NIH): 106 mg/dL — ABNORMAL HIGH (ref 0–99)
Triglycerides: 101 mg/dL (ref 0–149)
VLDL Cholesterol Cal: 18 mg/dL (ref 5–40)

## 2021-02-15 LAB — CBC
Hematocrit: 44.2 % (ref 37.5–51.0)
Hemoglobin: 14.8 g/dL (ref 13.0–17.7)
MCH: 29.7 pg (ref 26.6–33.0)
MCHC: 33.5 g/dL (ref 31.5–35.7)
MCV: 89 fL (ref 79–97)
Platelets: 200 10*3/uL (ref 150–450)
RBC: 4.99 x10E6/uL (ref 4.14–5.80)
RDW: 13.2 % (ref 11.6–15.4)
WBC: 6.3 10*3/uL (ref 3.4–10.8)

## 2021-02-15 LAB — PSA: Prostate Specific Ag, Serum: 1.8 ng/mL (ref 0.0–4.0)

## 2021-02-25 ENCOUNTER — Other Ambulatory Visit: Payer: Self-pay | Admitting: Nurse Practitioner

## 2021-02-25 DIAGNOSIS — Z720 Tobacco use: Secondary | ICD-10-CM

## 2021-03-20 ENCOUNTER — Ambulatory Visit (INDEPENDENT_AMBULATORY_CARE_PROVIDER_SITE_OTHER): Payer: Medicare PPO

## 2021-03-20 ENCOUNTER — Other Ambulatory Visit: Payer: Self-pay

## 2021-03-20 VITALS — BP 128/60 | HR 92 | Temp 98.1°F | Ht 68.0 in | Wt 161.0 lb

## 2021-03-20 DIAGNOSIS — Z0000 Encounter for general adult medical examination without abnormal findings: Secondary | ICD-10-CM

## 2021-03-20 NOTE — Progress Notes (Signed)
This visit occurred during the SARS-CoV-2 public health emergency.  Safety protocols were in place, including screening questions prior to the visit, additional usage of staff PPE, and extensive cleaning of exam room while observing appropriate contact time as indicated for disinfecting solutions.  Subjective:   Tyrone Walton is a 70 y.o. male who presents for Medicare Annual/Subsequent preventive examination.  Review of Systems     Cardiac Risk Factors include: advanced age (>63men, >63 women);dyslipidemia;hypertension;male gender;sedentary lifestyle     Objective:    Today's Vitals   03/20/21 0831  BP: 128/60  Pulse: 92  Temp: 98.1 F (36.7 C)  TempSrc: Oral  SpO2: 98%  Weight: 161 lb (73 kg)  Height: 5\' 8"  (1.727 m)   Body mass index is 24.48 kg/m.  Advanced Directives 03/20/2021 02/08/2020 02/03/2019 07/10/2017 05/10/2015 04/12/2015  Does Patient Have a Medical Advance Directive? No No No No No No  Would patient like information on creating a medical advance directive? No - Patient declined No - Patient declined No - Patient declined - No - patient declined information No - patient declined information    Current Medications (verified) Outpatient Encounter Medications as of 03/20/2021  Medication Sig  . acetaminophen (TYLENOL) 325 MG tablet Take 2 tablets (650 mg total) by mouth every 4 (four) hours as needed for headache or mild pain.  Marland Kitchen aspirin 81 MG chewable tablet Chew 1 tablet (81 mg total) by mouth daily.  Marland Kitchen atorvastatin (LIPITOR) 40 MG tablet TAKE 1 TABLET(40 MG) BY MOUTH DAILY  . buPROPion (WELLBUTRIN XL) 150 MG 24 hr tablet TAKE 1 TABLET(150 MG) BY MOUTH EVERY MORNING  . calcium carbonate (OSCAL) 1500 (600 Ca) MG TABS tablet Take 600 mg of elemental calcium by mouth every other day.  . clopidogrel (PLAVIX) 75 MG tablet TAKE 1 TABLET(75 MG) BY MOUTH DAILY  . cyclobenzaprine (FLEXERIL) 10 MG tablet Take 1 tablet (10 mg total) by mouth 3 (three) times daily as needed for  muscle spasms.  . diclofenac sodium (VOLTAREN) 1 % GEL Apply 2 g topically 4 (four) times daily.  Marland Kitchen MAGNESIUM OXIDE PO Take 500 mg by mouth daily.   . Multiple Vitamins-Minerals (ONE-A-DAY MENS 50+ ADVANTAGE) TABS Take 1 tablet by mouth daily.  Marland Kitchen olmesartan-hydrochlorothiazide (BENICAR HCT) 20-12.5 MG tablet TAKE 1 TABLET BY MOUTH EVERY DAY  . tiotropium (SPIRIVA HANDIHALER) 18 MCG inhalation capsule Place 1 capsule (18 mcg total) into inhaler and inhale daily.  . vitamin B-12 (CYANOCOBALAMIN) 100 MCG tablet Take 100 mcg by mouth daily.   No facility-administered encounter medications on file as of 03/20/2021.    Allergies (verified) Patient has no known allergies.   History: Past Medical History:  Diagnosis Date  . CAD (coronary artery disease)   . Constipation   . ED (erectile dysfunction)   . Hyperlipidemia   . Hypertension   . Pain in limb   . Peripheral vascular disease ([place])    claudication   Past Surgical History:  Procedure Laterality Date  . BACK SURGERY    . LUMBAR DISC SURGERY  ~ 2013  . PERIPHERAL VASCULAR CATHETERIZATION N/A 04/12/2015   Procedure: Lower Extremity Angiography;  Surgeon: Lorretta Harp, MD;  Location: [facility];  Service: Cardiovascular;  Laterality: N/A;  . PERIPHERAL VASCULAR CATHETERIZATION Right 05/10/2015   Procedure: Peripheral Vascular Intervention;  Surgeon: Lorretta Harp, MD;  Location: [facility];  Service: Cardiovascular;  Laterality: Right;  rt ext iliac stent   Family History  Problem Relation  Age of Onset  . Dementia Mother   . Heart failure Mother   . Heart attack Brother    Social History   Socioeconomic History  . Marital status: Married    Spouse name: Not on file  . Number of children: Not on file  . Years of education: Not on file  . Highest education level: Not on file  Occupational History  . Occupation: retired  . Occupation: part time employed  Tobacco Use  . Smoking status: Former Smoker     Packs/day: 0.50    Years: 49.00    Pack years: 24.50    Types: Cigarettes    Quit date: 04/13/2015    Years since quitting: 5.9  . Smokeless tobacco: Never Used  . Tobacco comment: Currently use Vapor, Quit vaping since December 01, 2020  Vaping Use  . Vaping Use: Former  . Substances: Nicotine, Flavoring  Substance and Sexual Activity  . Alcohol use: Yes    Alcohol/week: 6.0 standard drinks    Types: 6 Cans of beer per week    Comment: 05/10/2015 "I only drink on the weekends; down from 12 to 6 cans of beer"  . Drug use: No  . Sexual activity: Yes  Other Topics Concern  . Not on file  Social History Narrative  . Not on file   Social Determinants of Health   Financial Resource Strain: Low Risk   . Difficulty of Paying Living Expenses: Not hard at all  Food Insecurity: No Food Insecurity  . Worried About Charity fundraiser in the Last Year: Never true  . Ran Out of Food in the Last Year: Never true  Transportation Needs: No Transportation Needs  . Lack of Transportation (Medical): No  . Lack of Transportation (Non-Medical): No  Physical Activity: Inactive  . Days of Exercise per Week: 0 days  . Minutes of Exercise per Session: 0 min  Stress: No Stress Concern Present  . Feeling of Stress : Not at all  Social Connections: Not on file    Tobacco Counseling Counseling given: Not Answered Comment: Currently use Vapor, Quit vaping since December 01, 2020   Clinical Intake:  Pre-visit preparation completed: Yes  Pain : No/denies pain     Nutritional Status: BMI of 19-24  Normal Nutritional Risks: None Diabetes: No  How often do you need to have someone help you when you read instructions, pamphlets, or other written materials from your doctor or pharmacy?: 1 - Never What is the last grade level you completed in school?: 9th grade  Diabetic? no  Interpreter Needed?: No  Information entered by :: NAllen LPN   Activities of Daily Living In your present state  of health, do you have any difficulty performing the following activities: 03/20/2021 02/13/2021  Hearing? N N  Vision? N N  Difficulty concentrating or making decisions? N N  Walking or climbing stairs? N N  Dressing or bathing? N N  Doing errands, shopping? N N  Preparing Food and eating ? N -  Using the Toilet? N -  In the past six months, have you accidently leaked urine? N -  Do you have problems with loss of bowel control? N -  Managing your Medications? N -  Managing your Finances? N -  Housekeeping or managing your Housekeeping? N -  Some recent data might be hidden    Patient Care Team: [name], FNP as PCP - General (General Practice)  Indicate any recent Medical Services you may have  received from other than [facility] providers in the past year (date may be approximate).     Assessment:   This is a routine wellness examination for Tyrone Walton.  Hearing/Vision screen  Hearing Screening   125Hz  250Hz  500Hz  1000Hz  2000Hz  3000Hz  4000Hz  6000Hz  8000Hz   Right ear:           Left ear:           Vision Screening Comments: No regular eye exams, [facility]  Dietary issues and exercise activities discussed: Current Exercise Habits: The patient has a physically strenuous job, but has no regular exercise apart from work.  Goals Addressed            This Visit's Progress   . Patient Stated       03/20/2021, stay healthy      Depression Screen PHQ 2/9 Scores 03/20/2021 02/13/2021 02/08/2020 02/08/2020 11/01/2019 02/03/2019 09/24/2018  PHQ - 2 Score 0 0 0 0 0 0 0  PHQ- 9 Score - - 0 - - 0 -    Fall Risk Fall Risk  03/20/2021 02/13/2021 02/08/2020 02/08/2020 11/01/2019  Falls in the past year? 0 0 0 0 0  Risk for fall due to : Medication side effect - Medication side effect - -  Follow up Falls evaluation completed;Education provided;Falls prevention discussed - Falls evaluation completed;Education provided;Falls prevention discussed - -    FALL RISK PREVENTION PERTAINING TO THE HOME:  Any  stairs in or around the home? Yes  If so, are there any without handrails? Yes  Home free of loose throw rugs in walkways, pet beds, electrical cords, etc? Yes  Adequate lighting in your home to reduce risk of falls? Yes   ASSISTIVE DEVICES UTILIZED TO PREVENT FALLS:  Life alert? No  Use of a cane, walker or w/c? No  Grab bars in the bathroom? No  Shower chair or bench in shower? No  Elevated toilet seat or a handicapped toilet? Yes   TIMED UP AND GO:  Was the test performed? No .     Gait steady and fast without use of assistive device  Cognitive Function:     6CIT Screen 03/20/2021 02/08/2020 02/03/2019  What Year? 0 points 0 points 0 points  What month? 0 points 0 points 0 points  What time? 3 points 0 points 0 points  Count back from 20 0 points 0 points 0 points  Months in reverse 4 points 4 points 4 points  Repeat phrase 4 points 4 points 2 points  Total Score 11 8 6     Immunizations Immunization History  Administered Date(s) Administered  . Fluad Quad(high Dose 65+) 07/04/2020  . Influenza, High Dose Seasonal PF 07/14/2018, 06/11/2019  . Influenza-Unspecified 07/13/2018, 06/11/2019  . PFIZER(Purple Top)SARS-COV-2 Vaccination 12/10/2019, 01/07/2020, 02/15/2021  . Pneumococcal Conjugate-13 05/29/2015  . Pneumococcal Polysaccharide-23 05/28/2016  . Tdap 03/13/2016  . Zoster Recombinat (Shingrix) 01/29/2017, 04/14/2017    TDAP status: Up to date  Flu Vaccine status: Up to date  Pneumococcal vaccine status: Up to date  Covid-19 vaccine status: Completed vaccines  Qualifies for Shingles Vaccine? Yes   Zostavax completed No   Shingrix Completed?: Yes  Screening Tests Health Maintenance  Topic Date Due  . Pneumococcal Vaccine 84-23 Years old (1 of 4 - PCV13) Never done  . COLONOSCOPY (Pts 45-87yrs Insurance coverage will need to be confirmed)  02/14/2021  . INFLUENZA VACCINE  05/13/2021  . TETANUS/TDAP  03/13/2026  . COVID-19 Vaccine  Completed  . Hepatitis  C Screening  Completed  . PNA vac Low Risk Adult  Completed  . Zoster Vaccines- Shingrix  Completed  . HPV VACCINES  Aged Out    Health Maintenance  Health Maintenance Due  Topic Date Due  . Pneumococcal Vaccine 37-64 Years old (1 of 4 - PCV13) Never done  . COLONOSCOPY (Pts 45-31yrs Insurance coverage will need to be confirmed)  02/14/2021    Colorectal cancer screening: Type of screening: Colonoscopy. Completed 02/15/2016. Repeat every 10 years  Lung Cancer Screening: (Low Dose CT Chest recommended if Age 66-80 years, 30 pack-year currently smoking OR have quit w/in 15years.) does qualify.   Lung Cancer Screening Referral: CT scan 07/19/2020  Additional Screening:  Hepatitis C Screening: does qualify; Completed 02/08/2020  Vision Screening: Recommended annual ophthalmology exams for early detection of glaucoma and other disorders of the eye. Is the patient up to date with their annual eye exam?  No  Who is the provider or what is the name of the office in which the patient attends annual eye exams? [facility] If pt is not established with a provider, would they like to be referred to a provider to establish care? No .   Dental Screening: Recommended annual dental exams for proper oral hygiene  Community Resource Referral / Chronic Care Management: CRR required this visit?  No   CCM required this visit?  No      Plan:     I have personally reviewed and noted the following in the patient's chart:   . Medical and social history . Use of alcohol, tobacco or illicit drugs  . Current medications and supplements including opioid prescriptions. Patient is not currently taking opioid prescriptions. . Functional ability and status . Nutritional status . Physical activity . Advanced directives . List of other physicians . Hospitalizations, surgeries, and ER visits in previous 12 months . Vitals . Screenings to include cognitive, depression, and falls . Referrals and  appointments  In addition, I have reviewed and discussed with patient certain preventive protocols, quality metrics, and best practice recommendations. A written personalized care plan for preventive services as well as general preventive health recommendations were provided to patient.     Kellie Simmering, LPN   1/0/1751   Nurse Notes:

## 2021-03-20 NOTE — Patient Instructions (Signed)
Tyrone Walton , Thank you for taking time to come for your Medicare Wellness Visit. I appreciate your ongoing commitment to your health goals. Please review the following plan we discussed and let me know if I can assist you in the future.   Screening recommendations/referrals: Colonoscopy: completed 02/15/2016 Recommended yearly ophthalmology/optometry visit for glaucoma screening and checkup Recommended yearly dental visit for hygiene and checkup  Vaccinations: Influenza vaccine: completed 07/04/2020, due 05/13/2021 Pneumococcal vaccine: completed 05/28/2016 Tdap vaccine: completed 03/13/2016, due 03/13/2026 Shingles vaccine: completed    Covid-19:  02/15/2021, 01/07/2020, 12/10/2019  Advanced directives: Advance directive discussed with you today. Even though you declined this today please call our office should you change your mind and we can give you the proper paperwork for you to fill out.  Conditions/risks identified: none  Next appointment: Follow up in one year for your annual wellness visit.   Preventive Care 70 Years and Older, Male Preventive care refers to lifestyle choices and visits with your health care provider that can promote health and wellness. What does preventive care include?  A yearly physical exam. This is also called an annual well check.  Dental exams once or twice a year.  Routine eye exams. Ask your health care provider how often you should have your eyes checked.  Personal lifestyle choices, including:  Daily care of your teeth and gums.  Regular physical activity.  Eating a healthy diet.  Avoiding tobacco and drug use.  Limiting alcohol use.  Practicing safe sex.  Taking low doses of aspirin every day.  Taking vitamin and mineral supplements as recommended by your health care provider. What happens during an annual well check? The services and screenings done by your health care provider during your annual well check will depend on your age, overall  health, lifestyle risk factors, and family history of disease. Counseling  Your health care provider may ask you questions about your:  Alcohol use.  Tobacco use.  Drug use.  Emotional well-being.  Home and relationship well-being.  Sexual activity.  Eating habits.  History of falls.  Memory and ability to understand (cognition).  Work and work Statistician. Screening  You may have the following tests or measurements:  Height, weight, and BMI.  Blood pressure.  Lipid and cholesterol levels. These may be checked every 5 years, or more frequently if you are over 65 years old.  Skin check.  Lung cancer screening. You may have this screening every year starting at age 56 if you have a 30-pack-year history of smoking and currently smoke or have quit within the past 15 years.  Fecal occult blood test (FOBT) of the stool. You may have this test every year starting at age 58.  Flexible sigmoidoscopy or colonoscopy. You may have a sigmoidoscopy every 5 years or a colonoscopy every 10 years starting at age 10.  Prostate cancer screening. Recommendations will vary depending on your family history and other risks.  Hepatitis C blood test.  Hepatitis B blood test.  Sexually transmitted disease (STD) testing.  Diabetes screening. This is done by checking your blood sugar (glucose) after you have not eaten for a while (fasting). You may have this done every 1-3 years.  Abdominal aortic aneurysm (AAA) screening. You may need this if you are a current or former smoker.  Osteoporosis. You may be screened starting at age 66 if you are at high risk. Talk with your health care provider about your test results, treatment options, and if necessary, the need for more  tests. Vaccines  Your health care provider may recommend certain vaccines, such as:  Influenza vaccine. This is recommended every year.  Tetanus, diphtheria, and acellular pertussis (Tdap, Td) vaccine. You may need a Td  booster every 10 years.  Zoster vaccine. You may need this after age 65.  Pneumococcal 13-valent conjugate (PCV13) vaccine. One dose is recommended after age 47.  Pneumococcal polysaccharide (PPSV23) vaccine. One dose is recommended after age 72. Talk to your health care provider about which screenings and vaccines you need and how often you need them. This information is not intended to replace advice given to you by your health care provider. Make sure you discuss any questions you have with your health care provider. Document Released: 10/26/2015 Document Revised: 06/18/2016 Document Reviewed: 07/31/2015 Elsevier Interactive Patient Education  2017 [place] Prevention in the Home Falls can cause injuries. They can happen to people of all ages. There are many things you can do to make your home safe and to help prevent falls. What can I do on the outside of my home?  Regularly fix the edges of walkways and driveways and fix any cracks.  Remove anything that might make you trip as you walk through a door, such as a raised step or threshold.  Trim any bushes or trees on the path to your home.  Use bright outdoor lighting.  Clear any walking paths of anything that might make someone trip, such as rocks or tools.  Regularly check to see if handrails are loose or broken. Make sure that both sides of any steps have handrails.  Any raised decks and porches should have guardrails on the edges.  Have any leaves, snow, or ice cleared regularly.  Use sand or salt on walking paths during winter.  Clean up any spills in your garage right away. This includes oil or grease spills. What can I do in the bathroom?  Use night lights.  Install grab bars by the toilet and in the tub and shower. Do not use towel bars as grab bars.  Use non-skid mats or decals in the tub or shower.  If you need to sit down in the shower, use a plastic, non-slip stool.  Keep the floor dry. Clean up  any water that spills on the floor as soon as it happens.  Remove soap buildup in the tub or shower regularly.  Attach bath mats securely with double-sided non-slip rug tape.  Do not have throw rugs and other things on the floor that can make you trip. What can I do in the bedroom?  Use night lights.  Make sure that you have a light by your bed that is easy to reach.  Do not use any sheets or blankets that are too big for your bed. They should not hang down onto the floor.  Have a firm chair that has side arms. You can use this for support while you get dressed.  Do not have throw rugs and other things on the floor that can make you trip. What can I do in the kitchen?  Clean up any spills right away.  Avoid walking on wet floors.  Keep items that you use a lot in easy-to-reach places.  If you need to reach something above you, use a strong step stool that has a grab bar.  Keep electrical cords out of the way.  Do not use floor polish or wax that makes floors slippery. If you must use wax, use non-skid floor  wax.  Do not have throw rugs and other things on the floor that can make you trip. What can I do with my stairs?  Do not leave any items on the stairs.  Make sure that there are handrails on both sides of the stairs and use them. Fix handrails that are broken or loose. Make sure that handrails are as long as the stairways.  Check any carpeting to make sure that it is firmly attached to the stairs. Fix any carpet that is loose or worn.  Avoid having throw rugs at the top or bottom of the stairs. If you do have throw rugs, attach them to the floor with carpet tape.  Make sure that you have a light switch at the top of the stairs and the bottom of the stairs. If you do not have them, ask someone to add them for you. What else can I do to help prevent falls?  Wear shoes that:  Do not have high heels.  Have rubber bottoms.  Are comfortable and fit you well.  Are  closed at the toe. Do not wear sandals.  If you use a stepladder:  Make sure that it is fully opened. Do not climb a closed stepladder.  Make sure that both sides of the stepladder are locked into place.  Ask someone to hold it for you, if possible.  Clearly mark and make sure that you can see:  Any grab bars or handrails.  First and last steps.  Where the edge of each step is.  Use tools that help you move around (mobility aids) if they are needed. These include:  Canes.  Walkers.  Scooters.  Crutches.  Turn on the lights when you go into a dark area. Replace any light bulbs as soon as they burn out.  Set up your furniture so you have a clear path. Avoid moving your furniture around.  If any of your floors are uneven, fix them.  If there are any pets around you, be aware of where they are.  Review your medicines with your doctor. Some medicines can make you feel dizzy. This can increase your chance of falling. Ask your doctor what other things that you can do to help prevent falls. This information is not intended to replace advice given to you by your health care provider. Make sure you discuss any questions you have with your health care provider. Document Released: 07/26/2009 Document Revised: 03/06/2016 Document Reviewed: 11/03/2014 Elsevier Interactive Patient Education  2017 [employer].

## 2021-03-30 ENCOUNTER — Other Ambulatory Visit: Payer: Self-pay | Admitting: Cardiovascular Disease

## 2021-04-17 ENCOUNTER — Other Ambulatory Visit: Payer: Self-pay | Admitting: Cardiovascular Disease

## 2021-04-19 ENCOUNTER — Telehealth: Payer: Self-pay

## 2021-04-19 NOTE — Telephone Encounter (Signed)
Left a detailed message.

## 2021-04-19 NOTE — Telephone Encounter (Signed)
-----   Message from [name], [place] sent at 04/19/2021 12:38 AM EDT ----- All labs are normal except for cholesterol but it is improved continue avoid fried and fatty foods

## 2021-04-26 ENCOUNTER — Other Ambulatory Visit: Payer: Self-pay | Admitting: Nurse Practitioner

## 2021-04-26 DIAGNOSIS — Z720 Tobacco use: Secondary | ICD-10-CM

## 2021-05-04 ENCOUNTER — Other Ambulatory Visit: Payer: Self-pay | Admitting: Nurse Practitioner

## 2021-06-25 ENCOUNTER — Other Ambulatory Visit: Payer: Self-pay | Admitting: Nurse Practitioner

## 2021-06-25 DIAGNOSIS — Z720 Tobacco use: Secondary | ICD-10-CM

## 2021-06-25 MED ORDER — BUPROPION HCL ER (XL) 150 MG PO TB24: ORAL_TABLET | ORAL | 1 refills | Status: DC

## 2021-07-29 ENCOUNTER — Other Ambulatory Visit: Payer: Self-pay

## 2021-07-29 DIAGNOSIS — J439 Emphysema, unspecified: Secondary | ICD-10-CM

## 2021-07-29 MED ORDER — ATORVASTATIN CALCIUM 40 MG PO TABS: ORAL_TABLET | ORAL | 1 refills | Status: DC

## 2021-07-29 MED ORDER — OLMESARTAN MEDOXOMIL-HCTZ 20-12.5 MG PO TABS: 1.0000 | ORAL_TABLET | Freq: Every day | ORAL | 1 refills | Status: DC

## 2021-07-29 MED ORDER — SPIRIVA HANDIHALER 18 MCG IN CAPS: 18.0000 ug | ORAL_CAPSULE | Freq: Every day | RESPIRATORY_TRACT | 5 refills | Status: DC

## 2021-07-30 ENCOUNTER — Other Ambulatory Visit: Payer: Self-pay

## 2021-07-30 MED ORDER — CLOPIDOGREL BISULFATE 75 MG PO TABS: ORAL_TABLET | ORAL | 1 refills | Status: DC

## 2021-08-26 ENCOUNTER — Other Ambulatory Visit: Payer: Self-pay | Admitting: Cardiovascular Disease

## 2021-08-26 ENCOUNTER — Other Ambulatory Visit: Payer: Self-pay

## 2021-08-26 DIAGNOSIS — Z720 Tobacco use: Secondary | ICD-10-CM

## 2021-08-26 MED ORDER — BUPROPION HCL ER (XL) 150 MG PO TB24
ORAL_TABLET | ORAL | 1 refills | Status: DC
Start: 2021-08-26 — End: 2023-02-23

## 2021-08-27 ENCOUNTER — Other Ambulatory Visit: Payer: Self-pay | Admitting: Cardiovascular Disease

## 2021-08-27 NOTE — Telephone Encounter (Signed)
Refer to PCP

## 2021-11-27 ENCOUNTER — Ambulatory Visit: Payer: Medicare PPO | Admitting: Nurse Practitioner

## 2021-11-27 ENCOUNTER — Encounter: Payer: Self-pay | Admitting: Nurse Practitioner

## 2021-11-27 ENCOUNTER — Other Ambulatory Visit: Payer: Self-pay

## 2021-11-27 VITALS — BP 138/70 | HR 85 | Temp 98.1°F | Ht 68.0 in | Wt 168.0 lb

## 2021-11-27 DIAGNOSIS — L02422 Furuncle of left axilla: Secondary | ICD-10-CM | POA: Diagnosis not present

## 2021-11-27 DIAGNOSIS — L03818 Cellulitis of other sites: Secondary | ICD-10-CM | POA: Diagnosis not present

## 2021-11-27 MED ORDER — CEFTRIAXONE SODIUM 1 G IJ SOLR
1.0000 g | Freq: Once | INTRAMUSCULAR | Status: AC
  Administered 2021-11-27: 1 g via INTRAMUSCULAR

## 2021-11-27 MED ORDER — KETOROLAC TROMETHAMINE 30 MG/ML IJ SOLN
30.0000 mg | Freq: Once | INTRAMUSCULAR | Status: AC
  Administered 2021-11-27: 30 mg via INTRAMUSCULAR

## 2021-11-27 MED ORDER — CEPHALEXIN 500 MG PO CAPS: 500.0000 mg | ORAL_CAPSULE | Freq: Four times a day (QID) | ORAL | 0 refills | Status: AC

## 2021-11-27 NOTE — Patient Instructions (Signed)
Skin Abscess  A skin abscess is an infected area on or under your skin that contains a collection of pus and other material. An abscess may also be called a furuncle,carbuncle, or boil. An abscess can occur in or on almost any part of your body. Some abscesses break open (rupture) on their own. Most continue to get worse unless they are treated. The infection can spread deeper into the body and eventually into your blood, whichcan make you feel ill. Treatment usually involves draining the abscess. What are the causes? An abscess occurs when germs, like bacteria, pass through your skin and cause an infection. This may be caused by: A scrape or cut on your skin. A puncture wound through your skin, including a needle injection or insect bite. Blocked oil or sweat glands. Blocked and infected hair follicles. A cyst that forms beneath your skin (sebaceous cyst) and becomes infected. What increases the risk? This condition is more likely to develop in people who: Have a weak body defense system (immune system). Have diabetes. Have dry and irritated skin. Get frequent injections or use illegal IV drugs. Have a foreign body in a wound, such as a splinter. Have problems with their lymph system or veins. What are the signs or symptoms? Symptoms of this condition include: A painful, firm bump under the skin. A bump with pus at the top. This may break through the skin and drain. Other symptoms include: Redness surrounding the abscess site. Warmth. Swelling of the lymph nodes (glands) near the abscess. Tenderness. A sore on the skin. How is this diagnosed? This condition may be diagnosed based on: A physical exam. Your medical history. A sample of pus. This may be used to find out what is causing the infection. Blood tests. Imaging tests, such as an ultrasound, CT scan, or MRI. How is this treated? A small abscess that drains on its own may not need treatment. Treatment for larger abscesses  may include: Moist heat or heat pack applied to the area several times a day. A procedure to drain the abscess (incision and drainage). Antibiotic medicines. For a severe abscess, you may first get antibiotics through an IV and then change to antibiotics by mouth. Follow these instructions at home: Medicines  Take over-the-counter and prescription medicines only as told by your health care provider. If you were prescribed an antibiotic medicine, take it as told by your health care provider. Do not stop taking the antibiotic even if you start to feel better.  Abscess care  If you have an abscess that has not drained, apply heat to the affected area. Use the heat source that your health care provider recommends, such as a moist heat pack or a heating pad. Place a towel between your skin and the heat source. Leave the heat on for 20-30 minutes. Remove the heat if your skin turns bright red. This is especially important if you are unable to feel pain, heat, or cold. You may have a greater risk of getting burned. Follow instructions from your health care provider about how to take care of your abscess. Make sure you: Cover the abscess with a bandage (dressing). Change your dressing or gauze as told by your health care provider. Wash your hands with soap and water before you change the dressing or gauze. If soap and water are not available, use hand sanitizer. Check your abscess every day for signs of a worsening infection. Check for: More redness, swelling, or pain. More fluid or blood. Warmth. More   pus or a bad smell.  General instructions To avoid spreading the infection: Do not share personal care items, towels, or hot tubs with others. Avoid making skin contact with other people. Keep all follow-up visits as told by your health care provider. This is important. Contact a health care provider if you have: More redness, swelling, or pain around your abscess. More fluid or blood coming  from your abscess. Warm skin around your abscess. More pus or a bad smell coming from your abscess. A fever. Muscle aches. Chills or a general ill feeling. Get help right away if you: Have severe pain. See red streaks on your skin spreading away from the abscess. Summary A skin abscess is an infected area on or under your skin that contains a collection of pus and other material. A small abscess that drains on its own may not need treatment. Treatment for larger abscesses may include having a procedure to drain the abscess and taking an antibiotic. This information is not intended to replace advice given to you by your health care provider. Make sure you discuss any questions you have with your healthcare provider. Document Revised: 01/20/2019 Document Reviewed: 11/12/2017 Elsevier Patient Education  2022 [employer] Inc.  

## 2021-11-27 NOTE — Progress Notes (Signed)
I,[name],acting as a Education administrator for [employer], FNP.,have documented all relevant documentation on the behalf of [name], FNP,as directed by  [name], FNP while in the presence of [name], [place].  This visit occurred during the SARS-CoV-2 public health emergency.  Safety protocols were in place, including screening questions prior to the visit, additional usage of staff PPE, and extensive cleaning of exam room while observing appropriate contact time as indicated for disinfecting solutions.  Subjective:     Patient ID: Tyrone Walton , male    DOB: February 07, 1951 , 71 y.o.   MRN: [id number]   Chief Complaint  Patient presents with   Recurrent Skin Infections    HPI  Patient presents today for treatment for boil under his left arm.  Started last week and worsened over the weekend. He has used peroxide with epsom salt. Has taken Tylenol for the pain. It has opened and drained. He has been using stick deodorant that may have been old.  He does not shave his underarms    Past Medical History:  Diagnosis Date   CAD (coronary artery disease)    Constipation    ED (erectile dysfunction)    Hyperlipidemia    Hypertension    Pain in limb    Peripheral vascular disease (HCC)    claudication     Family History  Problem Relation Age of Onset   Dementia Mother    Heart failure Mother    Heart attack Brother      Current Outpatient Medications:    acetaminophen (TYLENOL) 325 MG tablet, Take 2 tablets (650 mg total) by mouth every 4 (four) hours as needed for headache or mild pain., Disp: , Rfl:    aspirin 81 MG chewable tablet, Chew 1 tablet (81 mg total) by mouth daily., Disp: , Rfl:    atorvastatin (LIPITOR) 40 MG tablet, TAKE 1 TABLET(40 MG) BY MOUTH DAILY, Disp: 60 tablet, Rfl: 1   buPROPion (WELLBUTRIN XL) 150 MG 24 hr tablet, TAKE 1 TABLET(150 MG) BY MOUTH EVERY MORNING, Disp: 30 tablet, Rfl: 1   calcium carbonate (OSCAL) 1500 (600 Ca) MG TABS tablet, Take 600 mg  of elemental calcium by mouth every other day., Disp: , Rfl:    cephALEXin (KEFLEX) 500 MG capsule, Take 1 capsule (500 mg total) by mouth 4 (four) times daily for 10 days., Disp: 40 capsule, Rfl: 0   clopidogrel (PLAVIX) 75 MG tablet, TAKE 1 TABLET(75 MG) BY MOUTH DAILY, Disp: 90 tablet, Rfl: 1   cyclobenzaprine (FLEXERIL) 10 MG tablet, Take 1 tablet (10 mg total) by mouth 3 (three) times daily as needed for muscle spasms., Disp: 30 tablet, Rfl: 0   diclofenac sodium (VOLTAREN) 1 % GEL, Apply 2 g topically 4 (four) times daily., Disp: 100 g, Rfl: 3   MAGNESIUM OXIDE PO, Take 500 mg by mouth daily. , Disp: , Rfl:    Multiple Vitamins-Minerals (ONE-A-DAY MENS 50+ ADVANTAGE) TABS, Take 1 tablet by mouth daily., Disp: , Rfl:    olmesartan-hydrochlorothiazide (BENICAR HCT) 20-12.5 MG tablet, Take 1 tablet by mouth daily., Disp: 90 tablet, Rfl: 1   tiotropium (SPIRIVA HANDIHALER) 18 MCG inhalation capsule, Place 1 capsule (18 mcg total) into inhaler and inhale daily., Disp: 30 capsule, Rfl: 5   vitamin B-12 (CYANOCOBALAMIN) 100 MCG tablet, Take 100 mcg by mouth daily., Disp: , Rfl:    No Known Allergies   Review of Systems  Constitutional: Negative.   Respiratory: Negative.    Cardiovascular: Negative.  Negative  for chest pain, palpitations and leg swelling.  Gastrointestinal: Negative.   Skin:  Positive for wound (Has boil to left underarm).  Neurological: Negative.     Today's Vitals   11/27/21 0933  BP: 138/70  Pulse: 85  Temp: 98.1 F (36.7 C)  TempSrc: Oral  Weight: 168 lb (76.2 kg)  Height: 5\' 8"  (1.727 m)  PainSc: 4    Body mass index is 25.54 kg/m.  Wt Readings from Last 3 Encounters:  11/27/21 168 lb (76.2 kg)  03/20/21 161 lb (73 kg)  02/13/21 161 lb 6.4 oz (73.2 kg)    Objective:  Physical Exam Vitals reviewed.  Constitutional:      General: He is not in acute distress.    Appearance: Normal appearance. He is normal weight.  Neck:     Vascular: No carotid bruit.   Cardiovascular:     Rate and Rhythm: Normal rate and regular rhythm.     Pulses: Normal pulses.     Heart sounds: Normal heart sounds. No murmur heard.   No gallop.  Pulmonary:     Effort: Pulmonary effort is normal. No respiratory distress.     Breath sounds: Normal breath sounds. No wheezing.  Musculoskeletal:        General: Normal range of motion.     Cervical back: Normal range of motion. No rigidity or tenderness.     Right lower leg: No edema.     Left lower leg: No edema.  Skin:    General: Skin is warm and dry.     Capillary Refill: Capillary refill takes less than 2 seconds.  Neurological:     General: No focal deficit present.     Mental Status: He is alert and oriented to person, place, and time. Mental status is at baseline.  Psychiatric:        Mood and Affect: Mood normal.        Behavior: Behavior normal.        Thought Content: Thought content normal.        Judgment: Judgment normal.        Assessment And Plan:     1. Furuncle of left axilla Comments: Large boil present to left axilla, firm mostly has one soft area on top, able to express small amount of drainage - pale gray. Culture done. cleaned with NS and applied bacitracin. Will return on Monday to recheck - ketorolac (TORADOL) 30 MG/ML injection 30 mg - cefTRIAXone (ROCEPHIN) injection 1 g  2. Cellulitis of other specified site Comments: Erythema present to left axilla, will treat with Rocephin 1 gram and cephalexin oral. - ketorolac (TORADOL) 30 MG/ML injection 30 mg - cefTRIAXone (ROCEPHIN) injection 1 g - cephALEXin (KEFLEX) 500 MG capsule; Take 1 capsule (500 mg total) by mouth 4 (four) times daily for 10 days.  Dispense: 40 capsule; Refill: 0     Patient was given opportunity to ask questions. Patient verbalized understanding of the plan and was able to repeat key elements of the plan. All questions were answered to their satisfaction.  [name], FNP   I, [name], FNP, have reviewed  all documentation for this visit. The documentation on 11/27/21 for the exam, diagnosis, procedures, and orders are all accurate and complete.   IF YOU HAVE BEEN REFERRED TO A SPECIALIST, IT MAY TAKE 1-2 WEEKS TO SCHEDULE/PROCESS THE REFERRAL. IF YOU HAVE NOT HEARD FROM US/SPECIALIST IN TWO WEEKS, PLEASE GIVE [place] A CALL AT [phone].   THE PATIENT  IS ENCOURAGED TO PRACTICE SOCIAL DISTANCING DUE TO THE COVID-19 PANDEMIC.

## 2021-11-27 NOTE — Addendum Note (Signed)
Addended by: [name] F on: 11/27/2021 05:05 PM   Modules accepted: Orders

## 2021-12-02 ENCOUNTER — Encounter: Payer: Self-pay | Admitting: Nurse Practitioner

## 2021-12-02 ENCOUNTER — Ambulatory Visit: Payer: Medicare PPO | Admitting: Nurse Practitioner

## 2021-12-02 ENCOUNTER — Other Ambulatory Visit: Payer: Self-pay

## 2021-12-02 VITALS — BP 136/82 | HR 79 | Temp 98.0°F | Ht 68.0 in | Wt 167.6 lb

## 2021-12-02 DIAGNOSIS — L02422 Furuncle of left axilla: Secondary | ICD-10-CM

## 2021-12-02 DIAGNOSIS — Z1211 Encounter for screening for malignant neoplasm of colon: Secondary | ICD-10-CM | POA: Diagnosis not present

## 2021-12-02 NOTE — Progress Notes (Signed)
I,Katawbba Wiggins,acting as a Education administrator for [employer], FNP.,have documented all relevant documentation on the behalf of [name], FNP,as directed by  [name], FNP while in the presence of [name], [place].  This visit occurred during the SARS-CoV-2 public health emergency.  Safety protocols were in place, including screening questions prior to the visit, additional usage of staff PPE, and extensive cleaning of exam room while observing appropriate contact time as indicated for disinfecting solutions.  Subjective:     Patient ID: Tyrone Walton , male    DOB: 27-Jul-1951 , 71 y.o.   MRN: [id number]   Chief Complaint  Patient presents with   recheck boil    HPI  The patient is here to have his boil rechecked.     Past Medical History:  Diagnosis Date   CAD (coronary artery disease)    Constipation    ED (erectile dysfunction)    Hyperlipidemia    Hypertension    Pain in limb    Peripheral vascular disease (HCC)    claudication     Family History  Problem Relation Age of Onset   Dementia Mother    Heart failure Mother    Heart attack Brother      Current Outpatient Medications:    acetaminophen (TYLENOL) 325 MG tablet, Take 2 tablets (650 mg total) by mouth every 4 (four) hours as needed for headache or mild pain., Disp: , Rfl:    aspirin 81 MG chewable tablet, Chew 1 tablet (81 mg total) by mouth daily., Disp: , Rfl:    atorvastatin (LIPITOR) 40 MG tablet, TAKE 1 TABLET(40 MG) BY MOUTH DAILY, Disp: 60 tablet, Rfl: 1   Benzocaine (BOIL EASE MAXIMUM STRENGTH) 20 % OINT, Apply topically., Disp: , Rfl:    buPROPion (WELLBUTRIN XL) 150 MG 24 hr tablet, TAKE 1 TABLET(150 MG) BY MOUTH EVERY MORNING, Disp: 30 tablet, Rfl: 1   calcium carbonate (OSCAL) 1500 (600 Ca) MG TABS tablet, Take 600 mg of elemental calcium by mouth every other day., Disp: , Rfl:    cephALEXin (KEFLEX) 500 MG capsule, Take 1 capsule (500 mg total) by mouth 4 (four) times daily for 10 days., Disp:  40 capsule, Rfl: 0   clopidogrel (PLAVIX) 75 MG tablet, TAKE 1 TABLET(75 MG) BY MOUTH DAILY, Disp: 90 tablet, Rfl: 1   cyclobenzaprine (FLEXERIL) 10 MG tablet, Take 1 tablet (10 mg total) by mouth 3 (three) times daily as needed for muscle spasms., Disp: 30 tablet, Rfl: 0   diclofenac sodium (VOLTAREN) 1 % GEL, Apply 2 g topically 4 (four) times daily., Disp: 100 g, Rfl: 3   MAGNESIUM OXIDE PO, Take 500 mg by mouth daily. , Disp: , Rfl:    Multiple Vitamins-Minerals (ONE-A-DAY MENS 50+ ADVANTAGE) TABS, Take 1 tablet by mouth daily., Disp: , Rfl:    olmesartan-hydrochlorothiazide (BENICAR HCT) 20-12.5 MG tablet, Take 1 tablet by mouth daily., Disp: 90 tablet, Rfl: 1   tiotropium (SPIRIVA HANDIHALER) 18 MCG inhalation capsule, Place 1 capsule (18 mcg total) into inhaler and inhale daily., Disp: 30 capsule, Rfl: 5   vitamin B-12 (CYANOCOBALAMIN) 100 MCG tablet, Take 100 mcg by mouth daily., Disp: , Rfl:    No Known Allergies   Review of Systems  Constitutional: Negative.   Respiratory: Negative.    Cardiovascular: Negative.   Gastrointestinal: Negative.   Psychiatric/Behavioral: Negative.    All other systems reviewed and are negative.   Today's Vitals   12/02/21 1030  BP: 136/82  Pulse: 79  Temp: 98 F (36.7 C)  Weight: 167 lb 9.6 oz (76 kg)  Height: 5\' 8"  (1.727 m)  PainSc: 0-No pain   Body mass index is 25.48 kg/m.   Objective:  Physical Exam Constitutional:      General: He is not in acute distress.    Appearance: Normal appearance.  Skin:    General: Skin is warm and dry.     Comments: Left axilla furuncle is improved, has small open area and slightly firm   Neurological:     General: No focal deficit present.     Mental Status: He is alert and oriented to person, place, and time.     Cranial Nerves: No cranial nerve deficit.     Motor: No weakness.  Psychiatric:        Mood and Affect: Mood normal.        Behavior: Behavior normal.        Thought Content: Thought  content normal.        Judgment: Judgment normal.        Assessment And Plan:     1. Furuncle of left axilla Comments: Improved, firm area better than before. Small open area with pale yellow/blood mix drainage. Advised to continue antibiotics until gone, if not better will refer to surgery.  Advised to use spray deodorant  2. Encounter for screening colonoscopy According to USPTF Colorectal cancer Screening guidelines. He was to have a repeat in 5 years due to polyps.  Will refer to GI for colon cancer screening. - Ambulatory referral to Gastroenterology     Patient was given opportunity to ask questions. Patient verbalized understanding of the plan and was able to repeat key elements of the plan. All questions were answered to their satisfaction.  [name], FNP   I, [name], FNP, have reviewed all documentation for this visit. The documentation on 12/02/21 for the exam, diagnosis, procedures, and orders are all accurate and complete.   IF YOU HAVE BEEN REFERRED TO A SPECIALIST, IT MAY TAKE 1-2 WEEKS TO SCHEDULE/PROCESS THE REFERRAL. IF YOU HAVE NOT HEARD FROM US/SPECIALIST IN TWO WEEKS, PLEASE GIVE [place] A CALL AT [phone].   THE PATIENT IS ENCOURAGED TO PRACTICE SOCIAL DISTANCING DUE TO THE COVID-19 PANDEMIC.

## 2021-12-03 LAB — WOUND CULTURE: Organism ID, Bacteria: NONE SEEN

## 2022-01-20 ENCOUNTER — Other Ambulatory Visit: Payer: Self-pay

## 2022-01-20 MED ORDER — CLOPIDOGREL BISULFATE 75 MG PO TABS: ORAL_TABLET | ORAL | 1 refills | Status: DC

## 2022-01-20 MED ORDER — OLMESARTAN MEDOXOMIL-HCTZ 20-12.5 MG PO TABS
1.0000 | ORAL_TABLET | Freq: Every day | ORAL | 1 refills | Status: DC
Start: 2022-01-20 — End: 2022-04-14

## 2022-01-20 MED ORDER — ATORVASTATIN CALCIUM 40 MG PO TABS: ORAL_TABLET | ORAL | 1 refills | Status: DC

## 2022-02-08 ENCOUNTER — Other Ambulatory Visit: Payer: Self-pay | Admitting: Nurse Practitioner

## 2022-02-18 ENCOUNTER — Encounter: Payer: Self-pay | Admitting: Nurse Practitioner

## 2022-02-18 ENCOUNTER — Ambulatory Visit (INDEPENDENT_AMBULATORY_CARE_PROVIDER_SITE_OTHER): Payer: Medicare PPO | Admitting: Nurse Practitioner

## 2022-02-18 VITALS — BP 130/80 | HR 71 | Temp 98.1°F | Ht 68.0 in | Wt 162.2 lb

## 2022-02-18 DIAGNOSIS — J439 Emphysema, unspecified: Secondary | ICD-10-CM | POA: Diagnosis not present

## 2022-02-18 DIAGNOSIS — I700 Atherosclerosis of aorta: Secondary | ICD-10-CM | POA: Diagnosis not present

## 2022-02-18 DIAGNOSIS — I10 Essential (primary) hypertension: Secondary | ICD-10-CM

## 2022-02-18 DIAGNOSIS — R3911 Hesitancy of micturition: Secondary | ICD-10-CM | POA: Diagnosis not present

## 2022-02-18 DIAGNOSIS — I70213 Atherosclerosis of native arteries of extremities with intermittent claudication, bilateral legs: Secondary | ICD-10-CM

## 2022-02-18 DIAGNOSIS — Z0000 Encounter for general adult medical examination without abnormal findings: Secondary | ICD-10-CM

## 2022-02-18 DIAGNOSIS — E782 Mixed hyperlipidemia: Secondary | ICD-10-CM

## 2022-02-18 DIAGNOSIS — H6122 Impacted cerumen, left ear: Secondary | ICD-10-CM | POA: Diagnosis not present

## 2022-02-18 LAB — POCT URINALYSIS DIPSTICK
Bilirubin, UA: NEGATIVE
Blood, UA: NEGATIVE
Glucose, UA: NEGATIVE
Ketones, UA: NEGATIVE
Leukocytes, UA: NEGATIVE
Nitrite, UA: NEGATIVE
Protein, UA: NEGATIVE
Spec Grav, UA: <=1.005 — AB (ref 1.010–1.025)
Urobilinogen, UA: 0.2 E.U./dL
pH, UA: 5.5 (ref 5.0–8.0)

## 2022-02-18 MED ORDER — SPIRIVA HANDIHALER 18 MCG IN CAPS: 18.0000 ug | ORAL_CAPSULE | Freq: Every day | RESPIRATORY_TRACT | 1 refills | Status: DC

## 2022-02-18 MED ORDER — ALBUTEROL SULFATE HFA 108 (90 BASE) MCG/ACT IN AERS: 2.0000 | INHALATION_SPRAY | Freq: Four times a day (QID) | RESPIRATORY_TRACT | 2 refills | Status: DC | PRN

## 2022-02-18 NOTE — Progress Notes (Signed)
I,Victoria T Hamilton,acting as a Education administrator for [name], FNP.,have documented all relevant documentation on the behalf of [name], FNP,as directed by  [name], FNP while in the presence of [name], [place].   This visit occurred during the SARS-CoV-2 public health emergency.  Safety protocols were in place, including screening questions prior to the visit, additional usage of staff PPE, and extensive cleaning of exam room while observing appropriate contact time as indicated for disinfecting solutions.  Subjective:     Patient ID: Tyrone Walton , male    DOB: [birth year] , 71 y.o.   MRN: [id number]   Chief Complaint  Patient presents with   Annual Exam    HPI  Pt here for HM. He reports compliance with meds. He states when he does yard work he quickly becomes fatigued. Reports when working on the fence is heavy. Will have fatigue when walking back and forth to the shed from the house.   Pt reports having colonoscopy     Past Medical History:  Diagnosis Date   CAD (coronary artery disease)    Constipation    ED (erectile dysfunction)    Hyperlipidemia    Hypertension    Pain in limb    Peripheral vascular disease (HCC)    claudication     Family History  Problem Relation Age of Onset   Dementia Mother    Heart failure Mother    Heart attack Brother      Current Outpatient Medications:    acetaminophen (TYLENOL) 325 MG tablet, Take 2 tablets (650 mg total) by mouth every 4 (four) hours as needed for headache or mild pain., Disp: , Rfl:    albuterol (VENTOLIN HFA) 108 (90 Base) MCG/ACT inhaler, Inhale 2 puffs into the lungs every 6 (six) hours as needed for wheezing or shortness of breath., Disp: 8 g, Rfl: 2   aspirin 81 MG chewable tablet, Chew 1 tablet (81 mg total) by mouth daily., Disp: , Rfl:    atorvastatin (LIPITOR) 40 MG tablet, TAKE 1 TABLET(40 MG) BY MOUTH DAILY, Disp: 90 tablet, Rfl: 1   Benzocaine (BOIL EASE MAXIMUM STRENGTH) 20 % OINT, Apply  topically., Disp: , Rfl:    buPROPion (WELLBUTRIN XL) 150 MG 24 hr tablet, TAKE 1 TABLET(150 MG) BY MOUTH EVERY MORNING, Disp: 30 tablet, Rfl: 1   calcium carbonate (OSCAL) 1500 (600 Ca) MG TABS tablet, Take 600 mg of elemental calcium by mouth every other day., Disp: , Rfl:    clopidogrel (PLAVIX) 75 MG tablet, TAKE 1 TABLET(75 MG) BY MOUTH DAILY, Disp: 90 tablet, Rfl: 1   cyclobenzaprine (FLEXERIL) 10 MG tablet, Take 1 tablet (10 mg total) by mouth 3 (three) times daily as needed for muscle spasms., Disp: 30 tablet, Rfl: 0   diclofenac sodium (VOLTAREN) 1 % GEL, Apply 2 g topically 4 (four) times daily., Disp: 100 g, Rfl: 3   MAGNESIUM OXIDE PO, Take 500 mg by mouth daily. , Disp: , Rfl:    olmesartan-hydrochlorothiazide (BENICAR HCT) 20-12.5 MG tablet, Take 1 tablet by mouth daily., Disp: 90 tablet, Rfl: 1   vitamin B-12 (CYANOCOBALAMIN) 100 MCG tablet, Take 100 mcg by mouth daily., Disp: , Rfl:    Multiple Vitamins-Minerals (ONE-A-DAY MENS 50+ ADVANTAGE) TABS, Take 1 tablet by mouth daily., Disp: , Rfl:    tiotropium (SPIRIVA HANDIHALER) 18 MCG inhalation capsule, Place 1 capsule (18 mcg total) into inhaler and inhale daily., Disp: 90 each, Rfl: 1   No Known Allergies   Men's  preventive visit. Patient Health Questionnaire (PHQ-2) is  Flowsheet Row Clinical Support from 03/20/2021 in [facility]  PHQ-2 Total Score 0      Patient is on a regular diet, he is trying to stay away from fried foods. Exercises - working out in the yard mostly. No additional physical activity. Marital status: Married. Relevant history for alcohol use is:  Social History   Substance and Sexual Activity  Alcohol Use Yes   Alcohol/week: 6.0 standard drinks   Types: 6 Cans of beer per week   Comment: 05/10/2015 "I only drink on the weekends; down from 12 to 6 cans of beer"   Relevant history for tobacco use is:  Social History   Tobacco Use  Smoking Status Former   Packs/day: 0.50    Years: 49.00   Pack years: 24.50   Types: Cigarettes   Quit date: 04/13/2015   Years since quitting: 6.8  Smokeless Tobacco Never  Tobacco Comments   Currently use Vapor, Quit vaping since December 01, 2020    Review of Systems  Constitutional: Negative.   HENT: Negative.    Eyes: Negative.   Respiratory: Negative.    Cardiovascular: Negative.   Gastrointestinal: Negative.   Endocrine: Negative.   Genitourinary: Negative.   Musculoskeletal: Negative.   Skin: Negative.   Allergic/Immunologic: Negative.   Neurological: Negative.   Hematological: Negative.   Psychiatric/Behavioral: Negative.      Today's Vitals   02/18/22 0841  BP: 130/80  Pulse: 71  Temp: 98.1 F (36.7 C)  SpO2: 98%  Weight: 162 lb 3.2 oz (73.6 kg)  Height: $Remove'5\' 8"'kHXeyFV$  (1.727 m)  PainSc: 0-No pain   Body mass index is 24.66 kg/m.   Objective:  Physical Exam Vitals reviewed.  Constitutional:      General: He is not in acute distress.    Appearance: Normal appearance.  HENT:     Head: Normocephalic and atraumatic.     Right Ear: Hearing, tympanic membrane, ear canal and external ear normal. There is no impacted cerumen.     Left Ear: Hearing and external ear normal. There is no impacted cerumen.     Ears:     Comments: Soft cerumen present to canal    Nose:     Comments: Deferred - masked    Mouth/Throat:     Comments: Deferred - masked Eyes:     Extraocular Movements: Extraocular movements intact.     Conjunctiva/sclera: Conjunctivae normal.     Pupils: Pupils are equal, round, and reactive to light.  Cardiovascular:     Rate and Rhythm: Normal rate and regular rhythm.     Pulses: Normal pulses.     Heart sounds: Normal heart sounds. No murmur heard. Pulmonary:     Effort: Pulmonary effort is normal. No respiratory distress.     Breath sounds: Normal breath sounds.  Abdominal:     General: Abdomen is flat. Bowel sounds are normal. There is no distension.     Palpations: Abdomen is soft. There  is no mass.     Tenderness: There is no abdominal tenderness.  Genitourinary:    Comments: Prostate exam Deferred - will check PSA  Musculoskeletal:        General: No swelling or tenderness. Normal range of motion.     Cervical back: Normal range of motion and neck supple. No tenderness.  Skin:    General: Skin is warm and dry.     Capillary Refill: Capillary refill takes less than 2  seconds.  Neurological:     General: No focal deficit present.     Mental Status: He is alert and oriented to person, place, and time.     Cranial Nerves: No cranial nerve deficit.  Psychiatric:        Mood and Affect: Mood normal.        Behavior: Behavior normal.        Thought Content: Thought content normal.        Judgment: Judgment normal.        Assessment And Plan:    1. Encounter for annual health examination Behavior modifications discussed and diet history reviewed.   Pt will continue to exercise regularly and modify diet with low GI, plant based foods and decrease intake of processed foods.  Recommend intake of daily multivitamin, Vitamin D, and calcium.  Recommend colonoscopy for preventive screenings, as well as recommend immunizations that include influenza, TDAP, and Shingles (up to date)  2. Essential hypertension Comments: Blood pressure is fairly controlled, continue current medications.  EKG done with SR occasional PVC's - EKG 12-Lead - POCT Urinalysis Dipstick (81002) - Microalbumin / creatinine urine ratio - CMP14+EGFR - Lipid panel - CBC - Hemoglobin A1c - PSA  3. Mixed hyperlipidemia Comments: Stable, tolerating statin well - CMP14+EGFR - Lipid panel - CBC - Hemoglobin A1c - PSA  4. Aortic atherosclerosis (HCC) Comments: Continue statin, tolerating well  5. Atherosclerosis of native artery of both lower extremities with intermittent claudication (HCC)  6. Pulmonary emphysema, unspecified emphysema type ([place]) Comments: I have advised him to take his spriva  daily this may help with his breathing. No swelling noted to extremities - tiotropium (SPIRIVA HANDIHALER) 18 MCG inhalation capsule; Place 1 capsule (18 mcg total) into inhaler and inhale daily.  Dispense: 90 each; Refill: 1  7. Urinary hesitancy Comments: Reports having difficulty with urinating at times, has in the past seen Urology for elevated PSA but has not been in quite some time  - Ambulatory referral to Urology  8. Excessive cerumen in left ear canal Comments: removed small amount of soft cerumen from ear with lighted curette  Medical release form completed for Dr Collene Mares to see last colonoscopy.   Patient was given opportunity to ask questions. Patient verbalized understanding of the plan and was able to repeat key elements of the plan. All questions were answered to their satisfaction.   [name], FNP   I, [name], FNP, have reviewed all documentation for this visit. The documentation on 02/18/22 for the exam, diagnosis, procedures, and orders are all accurate and complete.  THE PATIENT IS ENCOURAGED TO PRACTICE SOCIAL DISTANCING DUE TO THE COVID-19 PANDEMIC.

## 2022-02-18 NOTE — Patient Instructions (Signed)
Health Maintenance, Male Adopting a healthy lifestyle and getting preventive care are important in promoting health and wellness. Ask your health care provider about: The right schedule for you to have regular tests and exams. Things you can do on your own to prevent diseases and keep yourself healthy. What should I know about diet, weight, and exercise? Eat a healthy diet  Eat a diet that includes plenty of vegetables, fruits, low-fat dairy products, and lean protein. Do not eat a lot of foods that are high in solid fats, added sugars, or sodium. Maintain a healthy weight Body mass index (BMI) is a measurement that can be used to identify possible weight problems. It estimates body fat based on height and weight. Your health care provider can help determine your BMI and help you achieve or maintain a healthy weight. Get regular exercise Get regular exercise. This is one of the most important things you can do for your health. Most adults should: Exercise for at least 150 minutes each week. The exercise should increase your heart rate and make you sweat (moderate-intensity exercise). Do strengthening exercises at least twice a week. This is in addition to the moderate-intensity exercise. Spend less time sitting. Even light physical activity can be beneficial. Watch cholesterol and blood lipids Have your blood tested for lipids and cholesterol at 71 years of age, then have this test every 5 years. You may need to have your cholesterol levels checked more often if: Your lipid or cholesterol levels are high. You are older than 71 years of age. You are at high risk for heart disease. What should I know about cancer screening? Many types of cancers can be detected early and may often be prevented. Depending on your health history and family history, you may need to have cancer screening at various ages. This may include screening for: Colorectal cancer. Prostate cancer. Skin cancer. Lung  cancer. What should I know about heart disease, diabetes, and high blood pressure? Blood pressure and heart disease High blood pressure causes heart disease and increases the risk of stroke. This is more likely to develop in people who have high blood pressure readings or are overweight. Talk with your health care provider about your target blood pressure readings. Have your blood pressure checked: Every 3-5 years if you are 18-39 years of age. Every year if you are 40 years old or older. If you are between the ages of 65 and 75 and are a current or former smoker, ask your health care provider if you should have a one-time screening for abdominal aortic aneurysm (AAA). Diabetes Have regular diabetes screenings. This checks your fasting blood sugar level. Have the screening done: Once every three years after age 45 if you are at a normal weight and have a low risk for diabetes. More often and at a younger age if you are overweight or have a high risk for diabetes. What should I know about preventing infection? Hepatitis B If you have a higher risk for hepatitis B, you should be screened for this virus. Talk with your health care provider to find out if you are at risk for hepatitis B infection. Hepatitis C Blood testing is recommended for: Everyone born from 1945 through 1965. Anyone with known risk factors for hepatitis C. Sexually transmitted infections (STIs) You should be screened each year for STIs, including gonorrhea and chlamydia, if: You are sexually active and are younger than 71 years of age. You are older than 71 years of age and your   health care provider tells you that you are at risk for this type of infection. Your sexual activity has changed since you were last screened, and you are at increased risk for chlamydia or gonorrhea. Ask your health care provider if you are at risk. Ask your health care provider about whether you are at high risk for HIV. Your health care provider  may recommend a prescription medicine to help prevent HIV infection. If you choose to take medicine to prevent HIV, you should first get tested for HIV. You should then be tested every 3 months for as long as you are taking the medicine. Follow these instructions at home: Alcohol use Do not drink alcohol if your health care provider tells you not to drink. If you drink alcohol: Limit how much you have to 0-2 drinks a day. Know how much alcohol is in your drink. In the [place] one drink equals one 12 oz bottle of beer (355 mL), one 5 oz glass of wine (148 mL), or one 1 oz glass of hard liquor (44 mL). Lifestyle Do not use any products that contain nicotine or tobacco. These products include cigarettes, chewing tobacco, and vaping devices, such as e-cigarettes. If you need help quitting, ask your health care provider. Do not use street drugs. Do not share needles. Ask your health care provider for help if you need support or information about quitting drugs. General instructions Schedule regular health, dental, and eye exams. Stay current with your vaccines. Tell your health care provider if: You often feel depressed. You have ever been abused or do not feel safe at home. Summary Adopting a healthy lifestyle and getting preventive care are important in promoting health and wellness. Follow your health care provider's instructions about healthy diet, exercising, and getting tested or screened for diseases. Follow your health care provider's instructions on monitoring your cholesterol and blood pressure. This information is not intended to replace advice given to you by your health care provider. Make sure you discuss any questions you have with your health care provider. Document Revised: 02/18/2021 Document Reviewed: 02/18/2021 Elsevier Patient Education  2023 Elsevier Inc.  

## 2022-02-19 LAB — MICROALBUMIN / CREATININE URINE RATIO
Creatinine, Urine: 53.8 mg/dL
Microalb/Creat Ratio: <6 mg/g creat (ref 0–29)
Microalbumin, Urine: <3 ug/mL

## 2022-04-03 ENCOUNTER — Ambulatory Visit: Payer: Medicare PPO | Admitting: Nurse Practitioner

## 2022-04-03 ENCOUNTER — Ambulatory Visit: Payer: Medicare PPO

## 2022-04-03 ENCOUNTER — Ambulatory Visit (INDEPENDENT_AMBULATORY_CARE_PROVIDER_SITE_OTHER): Payer: Medicare PPO

## 2022-04-03 VITALS — Ht 68.0 in | Wt 162.0 lb

## 2022-04-03 DIAGNOSIS — Z0000 Encounter for general adult medical examination without abnormal findings: Secondary | ICD-10-CM

## 2022-04-03 NOTE — Patient Instructions (Signed)
Tyrone Walton , Thank you for taking time to come for your Medicare Wellness Visit. I appreciate your ongoing commitment to your health goals. Please review the following plan we discussed and let me know if I can assist you in the future.   Screening recommendations/referrals: Colonoscopy: completed 02/15/2016, due 02/15/2023 Recommended yearly ophthalmology/optometry visit for glaucoma screening and checkup Recommended yearly dental visit for hygiene and checkup  Vaccinations: Influenza vaccine: due 05/13/2022 Pneumococcal vaccine: completed 05/28/2016 Tdap vaccine: completed 03/13/2016, due 03/13/2026 Shingles vaccine: completed   Covid-19:  06/27/2021, 02/15/2021, 01/07/2020, 12/10/2019  Advanced directives: Advance directive discussed with you today.   Conditions/risks identified: none  Next appointment: Follow up in one year for your annual wellness visit.   Preventive Care 22 Years and Older, Male Preventive care refers to lifestyle choices and visits with your health care provider that can promote health and wellness. What does preventive care include? A yearly physical exam. This is also called an annual well check. Dental exams once or twice a year. Routine eye exams. Ask your health care provider how often you should have your eyes checked. Personal lifestyle choices, including: Daily care of your teeth and gums. Regular physical activity. Eating a healthy diet. Avoiding tobacco and drug use. Limiting alcohol use. Practicing safe sex. Taking low doses of aspirin every day. Taking vitamin and mineral supplements as recommended by your health care provider. What happens during an annual well check? The services and screenings done by your health care provider during your annual well check will depend on your age, overall health, lifestyle risk factors, and family history of disease. Counseling  Your health care provider may ask you questions about your: Alcohol use. Tobacco  use. Drug use. Emotional well-being. Home and relationship well-being. Sexual activity. Eating habits. History of falls. Memory and ability to understand (cognition). Work and work Statistician. Screening  You may have the following tests or measurements: Height, weight, and BMI. Blood pressure. Lipid and cholesterol levels. These may be checked every 5 years, or more frequently if you are over 49 years old. Skin check. Lung cancer screening. You may have this screening every year starting at age 46 if you have a 30-pack-year history of smoking and currently smoke or have quit within the past 15 years. Fecal occult blood test (FOBT) of the stool. You may have this test every year starting at age 35. Flexible sigmoidoscopy or colonoscopy. You may have a sigmoidoscopy every 5 years or a colonoscopy every 10 years starting at age 52. Prostate cancer screening. Recommendations will vary depending on your family history and other risks. Hepatitis C blood test. Hepatitis B blood test. Sexually transmitted disease (STD) testing. Diabetes screening. This is done by checking your blood sugar (glucose) after you have not eaten for a while (fasting). You may have this done every 1-3 years. Abdominal aortic aneurysm (AAA) screening. You may need this if you are a current or former smoker. Osteoporosis. You may be screened starting at age 69 if you are at high risk. Talk with your health care provider about your test results, treatment options, and if necessary, the need for more tests. Vaccines  Your health care provider may recommend certain vaccines, such as: Influenza vaccine. This is recommended every year. Tetanus, diphtheria, and acellular pertussis (Tdap, Td) vaccine. You may need a Td booster every 10 years. Zoster vaccine. You may need this after age 9. Pneumococcal 13-valent conjugate (PCV13) vaccine. One dose is recommended after age 41. Pneumococcal polysaccharide (PPSV23) vaccine.  One dose is recommended after age 55. Talk to your health care provider about which screenings and vaccines you need and how often you need them. This information is not intended to replace advice given to you by your health care provider. Make sure you discuss any questions you have with your health care provider. Document Released: 10/26/2015 Document Revised: 06/18/2016 Document Reviewed: 07/31/2015 Elsevier Interactive Patient Education  2017 [employer] Prevention in the Home Falls can cause injuries. They can happen to people of all ages. There are many things you can do to make your home safe and to help prevent falls. What can I do on the outside of my home? Regularly fix the edges of walkways and driveways and fix any cracks. Remove anything that might make you trip as you walk through a door, such as a raised step or threshold. Trim any bushes or trees on the path to your home. Use bright outdoor lighting. Clear any walking paths of anything that might make someone trip, such as rocks or tools. Regularly check to see if handrails are loose or broken. Make sure that both sides of any steps have handrails. Any raised decks and porches should have guardrails on the edges. Have any leaves, snow, or ice cleared regularly. Use sand or salt on walking paths during winter. Clean up any spills in your garage right away. This includes oil or grease spills. What can I do in the bathroom? Use night lights. Install grab bars by the toilet and in the tub and shower. Do not use towel bars as grab bars. Use non-skid mats or decals in the tub or shower. If you need to sit down in the shower, use a plastic, non-slip stool. Keep the floor dry. Clean up any water that spills on the floor as soon as it happens. Remove soap buildup in the tub or shower regularly. Attach bath mats securely with double-sided non-slip rug tape. Do not have throw rugs and other things on the floor that can make  you trip. What can I do in the bedroom? Use night lights. Make sure that you have a light by your bed that is easy to reach. Do not use any sheets or blankets that are too big for your bed. They should not hang down onto the floor. Have a firm chair that has side arms. You can use this for support while you get dressed. Do not have throw rugs and other things on the floor that can make you trip. What can I do in the kitchen? Clean up any spills right away. Avoid walking on wet floors. Keep items that you use a lot in easy-to-reach places. If you need to reach something above you, use a strong step stool that has a grab bar. Keep electrical cords out of the way. Do not use floor polish or wax that makes floors slippery. If you must use wax, use non-skid floor wax. Do not have throw rugs and other things on the floor that can make you trip. What can I do with my stairs? Do not leave any items on the stairs. Make sure that there are handrails on both sides of the stairs and use them. Fix handrails that are broken or loose. Make sure that handrails are as long as the stairways. Check any carpeting to make sure that it is firmly attached to the stairs. Fix any carpet that is loose or worn. Avoid having throw rugs at the top or bottom of the  stairs. If you do have throw rugs, attach them to the floor with carpet tape. Make sure that you have a light switch at the top of the stairs and the bottom of the stairs. If you do not have them, ask someone to add them for you. What else can I do to help prevent falls? Wear shoes that: Do not have high heels. Have rubber bottoms. Are comfortable and fit you well. Are closed at the toe. Do not wear sandals. If you use a stepladder: Make sure that it is fully opened. Do not climb a closed stepladder. Make sure that both sides of the stepladder are locked into place. Ask someone to hold it for you, if possible. Clearly mark and make sure that you can  see: Any grab bars or handrails. First and last steps. Where the edge of each step is. Use tools that help you move around (mobility aids) if they are needed. These include: Canes. Walkers. Scooters. Crutches. Turn on the lights when you go into a dark area. Replace any light bulbs as soon as they burn out. Set up your furniture so you have a clear path. Avoid moving your furniture around. If any of your floors are uneven, fix them. If there are any pets around you, be aware of where they are. Review your medicines with your doctor. Some medicines can make you feel dizzy. This can increase your chance of falling. Ask your doctor what other things that you can do to help prevent falls. This information is not intended to replace advice given to you by your health care provider. Make sure you discuss any questions you have with your health care provider. Document Released: 07/26/2009 Document Revised: 03/06/2016 Document Reviewed: 11/03/2014 Elsevier Interactive Patient Education  2017 [employer].

## 2022-04-14 ENCOUNTER — Other Ambulatory Visit: Payer: Self-pay

## 2022-04-14 MED ORDER — OLMESARTAN MEDOXOMIL-HCTZ 20-12.5 MG PO TABS: 1.0000 | ORAL_TABLET | Freq: Every day | ORAL | 1 refills | Status: DC

## 2022-04-21 ENCOUNTER — Encounter: Payer: Medicare PPO | Admitting: Nurse Practitioner

## 2022-07-14 ENCOUNTER — Encounter: Payer: Self-pay | Admitting: Nurse Practitioner

## 2022-07-14 ENCOUNTER — Ambulatory Visit: Payer: Medicare PPO | Admitting: Nurse Practitioner

## 2022-07-14 VITALS — BP 128/82 | HR 69 | Temp 97.8°F | Ht 68.0 in | Wt 161.0 lb

## 2022-07-14 DIAGNOSIS — I70213 Atherosclerosis of native arteries of extremities with intermittent claudication, bilateral legs: Secondary | ICD-10-CM | POA: Diagnosis not present

## 2022-07-14 DIAGNOSIS — E782 Mixed hyperlipidemia: Secondary | ICD-10-CM

## 2022-07-14 DIAGNOSIS — Z23 Encounter for immunization: Secondary | ICD-10-CM | POA: Diagnosis not present

## 2022-07-14 DIAGNOSIS — R202 Paresthesia of skin: Secondary | ICD-10-CM

## 2022-07-14 DIAGNOSIS — I10 Essential (primary) hypertension: Secondary | ICD-10-CM

## 2022-07-14 NOTE — Patient Instructions (Addendum)
Hypertension, Adult High blood pressure (hypertension) is when the force of blood pumping through the arteries is too strong. The arteries are the blood vessels that carry blood from the heart throughout the body. Hypertension forces the heart to work harder to pump blood and may cause arteries to become narrow or stiff. Untreated or uncontrolled hypertension can lead to a heart attack, heart failure, a stroke, kidney disease, and other problems. A blood pressure reading consists of a higher number over a lower number. Ideally, your blood pressure should be below 120/80. The first ("top") number is called the systolic pressure. It is a measure of the pressure in your arteries as your heart beats. The second ("bottom") number is called the diastolic pressure. It is a measure of the pressure in your arteries as the heart relaxes. What are the causes? The exact cause of this condition is not known. There are some conditions that result in high blood pressure. What increases the risk? Certain factors may make you more likely to develop high blood pressure. Some of these risk factors are under your control, including: Smoking. Not getting enough exercise or physical activity. Being overweight. Having too much fat, sugar, calories, or salt (sodium) in your diet. Drinking too much alcohol. Other risk factors include: Having a personal history of heart disease, diabetes, high cholesterol, or kidney disease. Stress. Having a family history of high blood pressure and high cholesterol. Having obstructive sleep apnea. Age. The risk increases with age. What are the signs or symptoms? High blood pressure may not cause symptoms. Very high blood pressure (hypertensive crisis) may cause: Headache. Fast or irregular heartbeats (palpitations). Shortness of breath. Nosebleed. Nausea and vomiting. Vision changes. Severe chest pain, dizziness, and seizures. How is this diagnosed? This condition is diagnosed by  measuring your blood pressure while you are seated, with your arm resting on a flat surface, your legs uncrossed, and your feet flat on the floor. The cuff of the blood pressure monitor will be placed directly against the skin of your upper arm at the level of your heart. Blood pressure should be measured at least twice using the same arm. Certain conditions can cause a difference in blood pressure between your right and left arms. If you have a high blood pressure reading during one visit or you have normal blood pressure with other risk factors, you may be asked to: Return on a different day to have your blood pressure checked again. Monitor your blood pressure at home for 1 week or longer. If you are diagnosed with hypertension, you may have other blood or imaging tests to help your health care provider understand your overall risk for other conditions. How is this treated? This condition is treated by making healthy lifestyle changes, such as eating healthy foods, exercising more, and reducing your alcohol intake. You may be referred for counseling on a healthy diet and physical activity. Your health care provider may prescribe medicine if lifestyle changes are not enough to get your blood pressure under control and if: Your systolic blood pressure is above 130. Your diastolic blood pressure is above 80. Your personal target blood pressure may vary depending on your medical conditions, your age, and other factors. Follow these instructions at home: Eating and drinking  Eat a diet that is high in fiber and potassium, and low in sodium, added sugar, and fat. An example of this eating plan is called the DASH diet. DASH stands for Dietary Approaches to Stop Hypertension. To eat this way: Eat   plenty of fresh fruits and vegetables. Try to fill one half of your plate at each meal with fruits and vegetables. Eat whole grains, such as whole-wheat pasta, brown rice, or whole-grain bread. Fill about one  fourth of your plate with whole grains. Eat or drink low-fat dairy products, such as skim milk or low-fat yogurt. Avoid fatty cuts of meat, processed or cured meats, and poultry with skin. Fill about one fourth of your plate with lean proteins, such as fish, chicken without skin, beans, eggs, or tofu. Avoid pre-made and processed foods. These tend to be higher in sodium, added sugar, and fat. Reduce your daily sodium intake. Many people with hypertension should eat less than 1,500 mg of sodium a day. Do not drink alcohol if: Your health care provider tells you not to drink. You are pregnant, may be pregnant, or are planning to become pregnant. If you drink alcohol: Limit how much you have to: 0-1 drink a day for women. 0-2 drinks a day for men. Know how much alcohol is in your drink. In the [place] one drink equals one 12 oz bottle of beer (355 mL), one 5 oz glass of wine (148 mL), or one 1 oz glass of hard liquor (44 mL). Lifestyle  Work with your health care provider to maintain a healthy body weight or to lose weight. Ask what an ideal weight is for you. Get at least 30 minutes of exercise that causes your heart to beat faster (aerobic exercise) most days of the week. Activities may include walking, swimming, or biking. Include exercise to strengthen your muscles (resistance exercise), such as Pilates or lifting weights, as part of your weekly exercise routine. Try to do these types of exercises for 30 minutes at least 3 days a week. Do not use any products that contain nicotine or tobacco. These products include cigarettes, chewing tobacco, and vaping devices, such as e-cigarettes. If you need help quitting, ask your health care provider. Monitor your blood pressure at home as told by your health care provider. Keep all follow-up visits. This is important. Medicines Take over-the-counter and prescription medicines only as told by your health care provider. Follow directions carefully. Blood  pressure medicines must be taken as prescribed. Do not skip doses of blood pressure medicine. Doing this puts you at risk for problems and can make the medicine less effective. Ask your health care provider about side effects or reactions to medicines that you should watch for. Contact a health care provider if you: Think you are having a reaction to a medicine you are taking. Have headaches that keep coming back (recurring). Feel dizzy. Have swelling in your ankles. Have trouble with your vision. Get help right away if you: Develop a severe headache or confusion. Have unusual weakness or numbness. Feel faint. Have severe pain in your chest or abdomen. Vomit repeatedly. Have trouble breathing. These symptoms may be an emergency. Get help right away. Call 911. Do not wait to see if the symptoms will go away. Do not drive yourself to the hospital. Summary Hypertension is when the force of blood pumping through your arteries is too strong. If this condition is not controlled, it may put you at risk for serious complications. Your personal target blood pressure may vary depending on your medical conditions, your age, and other factors. For most people, a normal blood pressure is less than 120/80. Hypertension is treated with lifestyle changes, medicines, or a combination of both. Lifestyle changes include losing weight, eating a healthy,   low-sodium diet, exercising more, and limiting alcohol. This information is not intended to replace advice given to you by your health care provider. Make sure you discuss any questions you have with your health care provider. Document Revised: 08/06/2021 Document Reviewed: 08/06/2021 Elsevier Patient Education  [place].   Influenza (Flu) Vaccine (Inactivated or Recombinant): What You Need to Know 1. Why get vaccinated? Influenza vaccine can prevent influenza (flu). Flu is a contagious disease that spreads around the [place] every year,  usually between October and May. Anyone can get the flu, but it is more dangerous for some people. Infants and young children, people 76 years and older, pregnant people, and people with certain health conditions or a weakened immune system are at greatest risk of flu complications. Pneumonia, bronchitis, sinus infections, and ear infections are examples of flu-related complications. If you have a medical condition, such as heart disease, cancer, or diabetes, flu can make it worse. Flu can cause fever and chills, sore throat, muscle aches, fatigue, cough, headache, and runny or stuffy nose. Some people may have vomiting and diarrhea, though this is more common in children than adults. In an average year, thousands of people in the [place] die from flu, and many more are hospitalized. Flu vaccine prevents millions of illnesses and flu-related visits to the doctor each year. 2. Influenza vaccines CDC recommends everyone 6 months and older get vaccinated every flu season. Children 6 months through 15 years of age may need 2 doses during a single flu season. Everyone else needs only 1 dose each flu season. It takes about 2 weeks for protection to develop after vaccination. There are many flu viruses, and they are always changing. Each year a new flu vaccine is made to protect against the influenza viruses believed to be likely to cause disease in the upcoming flu season. Even when the vaccine doesn't exactly match these viruses, it may still provide some protection. Influenza vaccine does not cause flu. Influenza vaccine may be given at the same time as other vaccines. 3. Talk with your health care provider Tell your vaccination provider if the person getting the vaccine: Has had an allergic reaction after a previous dose of influenza vaccine, or has any severe, life-threatening allergies Has ever had Guillain-Barr Syndrome (also called "GBS") In some cases, your health care provider may decide to  postpone influenza vaccination until a future visit. Influenza vaccine can be administered at any time during pregnancy. People who are or will be pregnant during influenza season should receive inactivated influenza vaccine. People with minor illnesses, such as a cold, may be vaccinated. People who are moderately or severely ill should usually wait until they recover before getting influenza vaccine. Your health care provider can give you more information. 4. Risks of a vaccine reaction Soreness, redness, and swelling where the shot is given, fever, muscle aches, and headache can happen after influenza vaccination. There may be a very small increased risk of Guillain-Barr Syndrome (GBS) after inactivated influenza vaccine (the flu shot). Young children who get the flu shot along with pneumococcal vaccine (PCV13) and/or DTaP vaccine at the same time might be slightly more likely to have a seizure caused by fever. Tell your health care provider if a child who is getting flu vaccine has ever had a seizure. People sometimes faint after medical procedures, including vaccination. Tell your provider if you feel dizzy or have vision changes or ringing in the ears. As with any medicine, there is a very remote  chance of a vaccine causing a severe allergic reaction, other serious injury, or death. 5. What if there is a serious problem? An allergic reaction could occur after the vaccinated person leaves the clinic. If you see signs of a severe allergic reaction (hives, swelling of the face and throat, difficulty breathing, a fast heartbeat, dizziness, or weakness), call 9-1-1 and get the person to the nearest hospital. For other signs that concern you, call your health care provider. Adverse reactions should be reported to the Vaccine Adverse Event Reporting System (VAERS). Your health care provider will usually file this report, or you can do it yourself. Visit the VAERS website at www.vaers.SamedayNews.es or call  309-056-4542. VAERS is only for reporting reactions, and VAERS staff members do not give medical advice. 6. The National Vaccine Injury Compensation Program The [employer] Vaccine Injury Compensation Program (VICP) is a federal program that was created to compensate people who may have been injured by certain vaccines. Claims regarding alleged injury or death due to vaccination have a time limit for filing, which may be as short as two years. Visit the VICP website at GoldCloset.com.ee or call 2036777299 to learn about the program and about filing a claim. 7. How can I learn more? Ask your health care provider. Call your local or state health department. Visit the website of the Food and Drug Administration (FDA) for vaccine package inserts and additional information at TraderRating.uy. Contact the Centers for Disease Control and Prevention (CDC): Call (438) 158-1956 (1-800-CDC-INFO) or Visit CDC's website at https://gibson.com/. Source: CDC Vaccine Information Statement Inactivated Influenza Vaccine (05/18/2020) This same material is available at http://www.wolf.info/ for no charge. This information is not intended to replace advice given to you by your health care provider. Make sure you discuss any questions you have with your health care provider. Document Revised: 08/28/2021 Document Reviewed: 06/20/2021 Elsevier Patient Education  [place].

## 2022-07-14 NOTE — Progress Notes (Signed)
I,Tyrone Walton,acting as a Education administrator for [employer], FNP.,have documented all relevant documentation on the behalf of Tyrone Brine, FNP,as directed by  Tyrone Brine, FNP while in the presence of Tyrone Walton, Tyrone Walton.  Subjective:     Patient ID: Tyrone Walton , male    DOB: Jul 14, 1951 , 71 y.o.   MRN: [id number]   Chief Complaint  Patient presents with   Hypertension    HPI  Patient is here for hypertension follow up.  Patient has concerns with low back pain and leg soreness with walking. Reporting pain to calves since going back to work after retiring, improves after sitting still. He has used diclofenac gel with relief.   Reports he will be having his colonoscopy in 2024  Hypertension This is a chronic problem. The current episode started more than 1 year ago. The problem is unchanged. The problem is controlled. Pertinent negatives include no anxiety, chest pain, malaise/fatigue, palpitations, peripheral edema or PND. There are no associated agents to hypertension. Risk factors for coronary artery disease include sedentary lifestyle and male gender. Past treatments include diuretics. The current treatment provides significant improvement. There are no compliance problems.  There is no history of angina or kidney disease. There is no history of chronic renal disease.     Past Medical History:  Diagnosis Date   CAD (coronary artery disease)    Constipation    ED (erectile dysfunction)    Hyperlipidemia    Hypertension    Pain in limb    Peripheral vascular disease (HCC)    claudication     Family History  Problem Relation Age of Onset   Dementia Mother    Heart failure Mother    Heart attack Brother      Current Outpatient Medications:    acetaminophen (TYLENOL) 325 MG tablet, Take 2 tablets (650 mg total) by mouth every 4 (four) hours as needed for headache or mild pain., Disp: , Rfl:    albuterol (VENTOLIN HFA) 108 (90 Base) MCG/ACT inhaler, Inhale 2 puffs into the  lungs every 6 (six) hours as needed for wheezing or shortness of breath., Disp: 8 g, Rfl: 2   aspirin 81 MG chewable tablet, Chew 1 tablet (81 mg total) by mouth daily., Disp: , Rfl:    atorvastatin (LIPITOR) 40 MG tablet, TAKE 1 TABLET(40 MG) BY MOUTH DAILY, Disp: 90 tablet, Rfl: 1   Benzocaine (BOIL EASE MAXIMUM STRENGTH) 20 % OINT, Apply topically., Disp: , Rfl:    buPROPion (WELLBUTRIN XL) 150 MG 24 hr tablet, TAKE 1 TABLET(150 MG) BY MOUTH EVERY MORNING, Disp: 30 tablet, Rfl: 1   calcium carbonate (OSCAL) 1500 (600 Ca) MG TABS tablet, Take 600 mg of elemental calcium by mouth every other day., Disp: , Rfl:    clopidogrel (PLAVIX) 75 MG tablet, TAKE 1 TABLET(75 MG) BY MOUTH DAILY, Disp: 90 tablet, Rfl: 1   cyclobenzaprine (FLEXERIL) 10 MG tablet, Take 1 tablet (10 mg total) by mouth 3 (three) times daily as needed for muscle spasms., Disp: 30 tablet, Rfl: 0   diclofenac sodium (VOLTAREN) 1 % GEL, Apply 2 g topically 4 (four) times daily., Disp: 100 g, Rfl: 3   MAGNESIUM OXIDE PO, Take 500 mg by mouth daily. , Disp: , Rfl:    Multiple Vitamins-Minerals (ONE-A-DAY MENS 50+ ADVANTAGE) TABS, Take 1 tablet by mouth daily., Disp: , Rfl:    olmesartan-hydrochlorothiazide (BENICAR HCT) 20-12.5 MG tablet, Take 1 tablet by mouth daily., Disp: 90 tablet, Rfl: 1   tiotropium (SPIRIVA  HANDIHALER) 18 MCG inhalation capsule, Place 1 capsule (18 mcg total) into inhaler and inhale daily., Disp: 90 each, Rfl: 1   vitamin B-12 (CYANOCOBALAMIN) 100 MCG tablet, Take 100 mcg by mouth daily., Disp: , Rfl:    No Known Allergies   Review of Systems  Constitutional: Negative.  Negative for malaise/fatigue.  Respiratory: Negative.    Cardiovascular: Negative.  Negative for chest pain, palpitations and PND.  Gastrointestinal: Negative.   Genitourinary: Negative.   Musculoskeletal:  Positive for arthralgias and back pain.  Neurological: Negative.        Burning sensation to bilateral feet when walking while at work.    Hematological: Negative.   Psychiatric/Behavioral: Negative.       Today's Vitals   07/14/22 0832  BP: 128/82  Pulse: 69  Temp: 97.8 F (36.6 C)  TempSrc: Oral  Weight: 161 lb (73 kg)  Height: $Remove'5\' 8"'klEVyCE$  (1.727 m)   Body mass index is 24.48 kg/m.   Objective:  Physical Exam Vitals reviewed.  Constitutional:      General: He is not in acute distress.    Appearance: Normal appearance.  Cardiovascular:     Rate and Rhythm: Normal rate and regular rhythm.     Pulses:          Dorsalis pedis pulses are 1+ on the right side and 1+ on the left side.     Heart sounds: Normal heart sounds. No murmur heard. Pulmonary:     Effort: Pulmonary effort is normal. No respiratory distress.     Breath sounds: Normal breath sounds. No wheezing.  Musculoskeletal:        General: Normal range of motion.  Skin:    General: Skin is warm and dry.     Capillary Refill: Capillary refill takes less than 2 seconds.     Comments: Right great toe with callus  Neurological:     General: No focal deficit present.     Mental Status: He is alert and oriented to person, place, and time.     Cranial Nerves: No cranial nerve deficit.     Motor: No weakness.  Psychiatric:        Mood and Affect: Mood normal.        Behavior: Behavior normal.        Thought Content: Thought content normal.        Judgment: Judgment normal.         Assessment And Plan:     1. Essential hypertension Comments: Blood pressure is controlled, continue current medications - BMP8+EGFR  2. Mixed hyperlipidemia Comments: Cholesterol levels are stable. Continue statin, tolerating well.  - Lipid panel  3. Atherosclerosis of native artery of both lower extremities with intermittent claudication (HCC) Comments: Continue statin, tolerating medications.  - Lipid panel  4. Tingling of both feet Comments: Will check for metabolic causes vs claudication component vs foot issue. Dorsalis pedal pulses are 1+ bilaterally. Will  refer to Podiatry pending labs has seen Dr. Melony Overly in the past.  - Vitamin B12 - VITAMIN D 25 Hydroxy (Vit-D Deficiency, Fractures) - TSH  5. Need for influenza vaccination Influenza vaccine administered Encouraged to take Tylenol as needed for fever or muscle aches - Flu Vaccine QUAD High Dose(Fluad)     Patient was given opportunity to ask questions. Patient verbalized understanding of the plan and was able to repeat key elements of the plan. All questions were answered to their satisfaction.  Tyrone Brine, FNP   I, Tyrone Brine, FNP,  have reviewed all documentation for this visit. The documentation on 07/14/22 for the exam, diagnosis, procedures, and orders are all accurate and complete.   IF YOU HAVE BEEN REFERRED TO A SPECIALIST, IT MAY TAKE 1-2 WEEKS TO SCHEDULE/PROCESS THE REFERRAL. IF YOU HAVE NOT HEARD FROM US/SPECIALIST IN TWO WEEKS, PLEASE GIVE [place] A CALL AT [phone] X [phone].   THE PATIENT IS ENCOURAGED TO PRACTICE SOCIAL DISTANCING DUE TO THE COVID-19 PANDEMIC.

## 2022-07-15 LAB — BMP8+EGFR
BUN/Creatinine Ratio: 15 (ref 10–24)
BUN: 14 mg/dL (ref 8–27)
CO2: 23 mmol/L (ref 20–29)
Calcium: 9.6 mg/dL (ref 8.6–10.2)
Chloride: 103 mmol/L (ref 96–106)
Creatinine, Ser: 0.96 mg/dL (ref 0.76–1.27)
Glucose: 101 mg/dL — ABNORMAL HIGH (ref 70–99)
Potassium: 4.2 mmol/L (ref 3.5–5.2)
Sodium: 141 mmol/L (ref 134–144)
eGFR: 85 mL/min/{1.73_m2} (ref >59)

## 2022-07-15 LAB — VITAMIN B12: Vitamin B-12: 1618 pg/mL — ABNORMAL HIGH (ref 232–1245)

## 2022-07-15 LAB — TSH: TSH: 0.467 u[IU]/mL (ref 0.450–4.500)

## 2022-07-15 LAB — VITAMIN D 25 HYDROXY (VIT D DEFICIENCY, FRACTURES): Vit D, 25-Hydroxy: 47.1 ng/mL (ref 30.0–100.0)

## 2022-08-01 ENCOUNTER — Other Ambulatory Visit: Payer: Self-pay | Admitting: Nurse Practitioner

## 2022-08-28 ENCOUNTER — Other Ambulatory Visit: Payer: Self-pay

## 2022-08-28 MED ORDER — CLOPIDOGREL BISULFATE 75 MG PO TABS
75.0000 mg | ORAL_TABLET | Freq: Every day | ORAL | 1 refills | Status: DC
Start: 2022-08-28 — End: 2023-10-06

## 2022-11-17 DIAGNOSIS — K5900 Constipation, unspecified: Secondary | ICD-10-CM | POA: Insufficient documentation

## 2022-11-18 ENCOUNTER — Ambulatory Visit: Payer: Medicare PPO | Admitting: Nurse Practitioner

## 2022-11-18 ENCOUNTER — Encounter: Payer: Self-pay | Admitting: Nurse Practitioner

## 2022-11-18 VITALS — BP 132/88 | HR 68 | Temp 98.1°F | Ht 68.0 in | Wt 164.0 lb

## 2022-11-18 DIAGNOSIS — I10 Essential (primary) hypertension: Secondary | ICD-10-CM | POA: Diagnosis not present

## 2022-11-18 DIAGNOSIS — R748 Abnormal levels of other serum enzymes: Secondary | ICD-10-CM

## 2022-11-18 DIAGNOSIS — I739 Peripheral vascular disease, unspecified: Secondary | ICD-10-CM | POA: Diagnosis not present

## 2022-11-18 DIAGNOSIS — I119 Hypertensive heart disease without heart failure: Secondary | ICD-10-CM | POA: Diagnosis not present

## 2022-11-18 DIAGNOSIS — G4709 Other insomnia: Secondary | ICD-10-CM

## 2022-11-18 DIAGNOSIS — E782 Mixed hyperlipidemia: Secondary | ICD-10-CM

## 2022-11-18 DIAGNOSIS — Z720 Tobacco use: Secondary | ICD-10-CM | POA: Diagnosis not present

## 2022-11-18 DIAGNOSIS — I700 Atherosclerosis of aorta: Secondary | ICD-10-CM | POA: Diagnosis not present

## 2022-11-18 NOTE — Progress Notes (Addendum)
I,Tyrone Walton,acting as a Education administrator for Tyrone Brine, FNP.,have documented all relevant documentation on the behalf of Tyrone Brine, FNP,as directed by  Tyrone Brine, FNP while in the presence of Tyrone Walton, [place].    Subjective:     Patient ID: Tyrone Walton , male    DOB: 06-Dec-1950 , 72 y.o.   MRN: [id number]   Chief Complaint  Patient presents with   Hypertension    HPI  Patient presents today for hypertension follow up.  The tingling to his feet and legs is better he cut out his B12 as the levels were high. He is working a little more and feels like he is having mood swings. He is attending basketball games at night as well.  He had been taking wellbutrin to help quit smoking and wants to know if he should continue.   He is scheduled for his colonoscopy  Hypertension This is a chronic problem. The problem is controlled. Pertinent negatives include no anxiety, chest pain, shortness of breath or sweats. Risk factors for coronary artery disease include dyslipidemia, male gender and smoking/tobacco exposure. There are no compliance problems.  There is no history of angina. There is no history of chronic renal disease.  Insomnia Primary symptoms: sleep disturbance, frequent awakening.   The onset quality is sudden. The problem occurs intermittently. Exacerbated by: previous history of smoking quit more than 2 years ago. Past treatments include nothing. Typical bedtime:  10-11 P.M..  PMH includes: associated symptoms present.      Past Medical History:  Diagnosis Date   CAD (coronary artery disease)    Constipation    ED (erectile dysfunction)    Hyperlipidemia    Hypertension    Pain in limb    Peripheral vascular disease (HCC)    claudication     Family History  Problem Relation Age of Onset   Dementia Mother    Heart failure Mother    Heart attack Brother      Current Outpatient Medications:    acetaminophen (TYLENOL) 325 MG tablet, Take 2 tablets (650 mg total)  by mouth every 4 (four) hours as needed for headache or mild pain., Disp: , Rfl:    albuterol (VENTOLIN HFA) 108 (90 Base) MCG/ACT inhaler, Inhale 2 puffs into the lungs every 6 (six) hours as needed for wheezing or shortness of breath., Disp: 8 g, Rfl: 2   aspirin 81 MG chewable tablet, Chew 1 tablet (81 mg total) by mouth daily., Disp: , Rfl:    atorvastatin (LIPITOR) 40 MG tablet, TAKE 1 TABLET(40 MG) BY MOUTH DAILY, Disp: 90 tablet, Rfl: 1   Benzocaine (BOIL EASE MAXIMUM STRENGTH) 20 % OINT, Apply topically., Disp: , Rfl:    buPROPion (WELLBUTRIN XL) 150 MG 24 hr tablet, TAKE 1 TABLET(150 MG) BY MOUTH EVERY MORNING, Disp: 30 tablet, Rfl: 1   calcium carbonate (OSCAL) 1500 (600 Ca) MG TABS tablet, Take 600 mg of elemental calcium by mouth every other day., Disp: , Rfl:    clopidogrel (PLAVIX) 75 MG tablet, Take 1 tablet (75 mg total) by mouth daily., Disp: 90 tablet, Rfl: 1   cyclobenzaprine (FLEXERIL) 10 MG tablet, Take 1 tablet (10 mg total) by mouth 3 (three) times daily as needed for muscle spasms., Disp: 30 tablet, Rfl: 0   diclofenac sodium (VOLTAREN) 1 % GEL, Apply 2 g topically 4 (four) times daily., Disp: 100 g, Rfl: 3   MAGNESIUM OXIDE PO, Take 500 mg by mouth daily. , Disp: , Rfl:  Multiple Vitamins-Minerals (ONE-A-DAY MENS 50+ ADVANTAGE) TABS, Take 1 tablet by mouth daily., Disp: , Rfl:    olmesartan-hydrochlorothiazide (BENICAR HCT) 20-12.5 MG tablet, Take 1 tablet by mouth daily., Disp: 90 tablet, Rfl: 1   tiotropium (SPIRIVA HANDIHALER) 18 MCG inhalation capsule, Place 1 capsule (18 mcg total) into inhaler and inhale daily., Disp: 90 each, Rfl: 1   No Known Allergies   Review of Systems  Constitutional: Negative.   Respiratory: Negative.  Negative for shortness of breath.   Cardiovascular: Negative.  Negative for chest pain.  Neurological: Negative.   Psychiatric/Behavioral:  Positive for sleep disturbance. The patient has insomnia.      Today's Vitals   11/18/22 0852   BP: 132/88  Pulse: 68  Temp: 98.1 F (36.7 C)  TempSrc: Oral  SpO2: 99%  Weight: 164 lb (74.4 kg)  Height: '5\' 8"'$  (1.727 m)   Body mass index is 24.94 kg/m.   Objective:  Physical Exam Vitals reviewed.  Constitutional:      General: He is not in acute distress.    Appearance: Normal appearance.  Cardiovascular:     Rate and Rhythm: Normal rate and regular rhythm.     Pulses:          Dorsalis pedis pulses are 1+ on the right side and 1+ on the left side.     Heart sounds: Normal heart sounds. No murmur heard. Pulmonary:     Effort: Pulmonary effort is normal. No respiratory distress.     Breath sounds: Normal breath sounds. No wheezing.  Musculoskeletal:        General: Normal range of motion.  Skin:    General: Skin is warm and dry.     Capillary Refill: Capillary refill takes less than 2 seconds.  Neurological:     General: No focal deficit present.     Mental Status: He is alert and oriented to person, place, and time.     Cranial Nerves: No cranial nerve deficit.     Motor: No weakness.  Psychiatric:        Mood and Affect: Mood normal.        Behavior: Behavior normal.        Thought Content: Thought content normal.        Judgment: Judgment normal.         Assessment And Plan:     1. Hypertensive heart disease without congestive heart failure Comments: Blood pressure is fairly controlled.  Advised to focus on lifestyle modifications. - BMP8+eGFR  2. Aortic atherosclerosis (HCC) Comments: Continue statin, tolerating well. - Lipid panel  3. Mixed hyperlipidemia Comments: Cholesterol levels are stable.  Continue statin, tolerating well.  Encouraged to eat a low-fat diet.  Encouraged to increase fiber intake as well - Lipid panel  4. Tobacco abuse Comments: Recommend he continues the Wellbutrin this may help with his mood. - CT CHEST LUNG CA SCREEN LOW DOSE W/O CM; Future  5. Other insomnia Comments: Had a long discussion with him about sleeping at  least 7 to 8 hours per night.  He can try over-the-counter melatonin to see if this helps him to sleep - TSH  6. Elevated vitamin B12 level Comments: Will recheck B12 low goals were elevated at last visit. - Vitamin B12 - VITAMIN D 25 Hydroxy (Vit-D Deficiency, Fractures)  7. Peripheral vascular disease ([place]) Comments: Continue medications no changes     Patient was given opportunity to ask questions. Patient verbalized understanding of the plan and was able  to repeat key elements of the plan. All questions were answered to their satisfaction.  Tyrone Brine, FNP   I, Tyrone Brine, FNP, have reviewed all documentation for this visit. The documentation on 11/18/22 for the exam, diagnosis, procedures, and orders are all accurate and complete.   IF YOU HAVE BEEN REFERRED TO A SPECIALIST, IT MAY TAKE 1-2 WEEKS TO SCHEDULE/PROCESS THE REFERRAL. IF YOU HAVE NOT HEARD FROM US/SPECIALIST IN TWO WEEKS, PLEASE GIVE [place] A CALL AT [phone] X [phone].   THE PATIENT IS ENCOURAGED TO PRACTICE SOCIAL DISTANCING DUE TO THE COVID-19 PANDEMIC.

## 2022-11-18 NOTE — Patient Instructions (Signed)
Insomnia Insomnia is a sleep disorder that makes it difficult to fall asleep or stay asleep. Insomnia can cause fatigue, low energy, difficulty concentrating, mood swings, and poor performance at work or school. There are three different ways to classify insomnia: Difficulty falling asleep. Difficulty staying asleep. Waking up too early in the morning. Any type of insomnia can be long-term (chronic) or short-term (acute). Both are common. Short-term insomnia usually lasts for 3 months or less. Chronic insomnia occurs at least three times a week for longer than 3 months. What are the causes? Insomnia may be caused by another condition, situation, or substance, such as: Having certain mental health conditions, such as anxiety and depression. Using caffeine, alcohol, tobacco, or drugs. Having gastrointestinal conditions, such as gastroesophageal reflux disease (GERD). Having certain medical conditions. These include: Asthma. Alzheimer's disease. Stroke. Chronic pain. An overactive thyroid gland (hyperthyroidism). Other sleep disorders, such as restless legs syndrome and sleep apnea. Menopause. Sometimes, the cause of insomnia may not be known. What increases the risk? Risk factors for insomnia include: Gender. Females are affected more often than males. Age. Insomnia is more common as people get older. Stress and certain medical and mental health conditions. Lack of exercise. Having an irregular work schedule. This may include working night shifts and traveling between different time zones. What are the signs or symptoms? If you have insomnia, the main symptom is having trouble falling asleep or having trouble staying asleep. This may lead to other symptoms, such as: Feeling tired or having low energy. Feeling nervous about going to sleep. Not feeling rested in the morning. Having trouble concentrating. Feeling irritable, anxious, or depressed. How is this diagnosed? This condition  may be diagnosed based on: Your symptoms and medical history. Your health care provider may ask about: Your sleep habits. Any medical conditions you have. Your mental health. A physical exam. How is this treated? Treatment for insomnia depends on the cause. Treatment may focus on treating an underlying condition that is causing the insomnia. Treatment may also include: Medicines to help you sleep. Counseling or therapy. Lifestyle adjustments to help you sleep better. Follow these instructions at home: Eating and drinking  Limit or avoid alcohol, caffeinated beverages, and products that contain nicotine and tobacco, especially close to bedtime. These can disrupt your sleep. Do not eat a large meal or eat spicy foods right before bedtime. This can lead to digestive discomfort that can make it hard for you to sleep. Sleep habits  Keep a sleep diary to help you and your health care provider figure out what could be causing your insomnia. Write down: When you sleep. When you wake up during the night. How well you sleep and how rested you feel the next day. Any side effects of medicines you are taking. What you eat and drink. Make your bedroom a dark, comfortable place where it is easy to fall asleep. Put up shades or blackout curtains to block light from outside. Use a white noise machine to block noise. Keep the temperature cool. Limit screen use before bedtime. This includes: Not watching TV. Not using your smartphone, tablet, or computer. Stick to a routine that includes going to bed and waking up at the same times every day and night. This can help you fall asleep faster. Consider making a quiet activity, such as reading, part of your nighttime routine. Try to avoid taking naps during the day so that you sleep better at night. Get out of bed if you are still awake after   15 minutes of trying to sleep. Keep the lights down, but try reading or doing a quiet activity. When you feel  sleepy, go back to bed. General instructions Take over-the-counter and prescription medicines only as told by your health care provider. Exercise regularly as told by your health care provider. However, avoid exercising in the hours right before bedtime. Use relaxation techniques to manage stress. Ask your health care provider to suggest some techniques that may work well for you. These may include: Breathing exercises. Routines to release muscle tension. Visualizing peaceful scenes. Make sure that you drive carefully. Do not drive if you feel very sleepy. Keep all follow-up visits. This is important. Contact a health care provider if: You are tired throughout the day. You have trouble in your daily routine due to sleepiness. You continue to have sleep problems, or your sleep problems get worse. Get help right away if: You have thoughts about hurting yourself or someone else. Get help right away if you feel like you may hurt yourself or others, or have thoughts about taking your own life. Go to your nearest emergency room or: Call 911. Call the [place] at (458) 453-8054 or 988. This is open 24 hours a day. Text the Crisis Text Line at (269)376-0579. Summary Insomnia is a sleep disorder that makes it difficult to fall asleep or stay asleep. Insomnia can be long-term (chronic) or short-term (acute). Treatment for insomnia depends on the cause. Treatment may focus on treating an underlying condition that is causing the insomnia. Keep a sleep diary to help you and your health care provider figure out what could be causing your insomnia. This information is not intended to replace advice given to you by your health care provider. Make sure you discuss any questions you have with your health care provider. Document Revised: 09/09/2021 Document Reviewed: 09/09/2021 Elsevier Patient Education  [place] can take melatonin at least 3 mg at bedtime and focus  on sleep hygiene.

## 2022-11-19 LAB — LIPID PANEL
Chol/HDL Ratio: 2.9 ratio (ref 0.0–5.0)
Cholesterol, Total: 191 mg/dL (ref 100–199)
HDL: 67 mg/dL (ref >39)
LDL Chol Calc (NIH): 102 mg/dL — ABNORMAL HIGH (ref 0–99)
Triglycerides: 123 mg/dL (ref 0–149)
VLDL Cholesterol Cal: 22 mg/dL (ref 5–40)

## 2022-11-19 LAB — TSH: TSH: 0.676 u[IU]/mL (ref 0.450–4.500)

## 2022-11-19 LAB — BMP8+EGFR
BUN/Creatinine Ratio: 14 (ref 10–24)
BUN: 13 mg/dL (ref 8–27)
CO2: 25 mmol/L (ref 20–29)
Calcium: 10.4 mg/dL — ABNORMAL HIGH (ref 8.6–10.2)
Chloride: 101 mmol/L (ref 96–106)
Creatinine, Ser: 0.93 mg/dL (ref 0.76–1.27)
Glucose: 94 mg/dL (ref 70–99)
Potassium: 4.9 mmol/L (ref 3.5–5.2)
Sodium: 138 mmol/L (ref 134–144)
eGFR: 87 mL/min/{1.73_m2} (ref >59)

## 2022-11-19 LAB — VITAMIN B12: Vitamin B-12: 1087 pg/mL (ref 232–1245)

## 2022-11-19 LAB — VITAMIN D 25 HYDROXY (VIT D DEFICIENCY, FRACTURES): Vit D, 25-Hydroxy: 41.3 ng/mL (ref 30.0–100.0)

## 2022-11-24 DIAGNOSIS — R748 Abnormal levels of other serum enzymes: Secondary | ICD-10-CM

## 2022-11-24 DIAGNOSIS — I700 Atherosclerosis of aorta: Secondary | ICD-10-CM | POA: Insufficient documentation

## 2022-11-24 DIAGNOSIS — R7989 Other specified abnormal findings of blood chemistry: Secondary | ICD-10-CM

## 2022-11-24 DIAGNOSIS — G4709 Other insomnia: Secondary | ICD-10-CM | POA: Insufficient documentation

## 2022-11-24 HISTORY — DX: Other specified abnormal findings of blood chemistry: R79.89

## 2022-11-24 HISTORY — DX: Abnormal levels of other serum enzymes: R74.8

## 2022-12-09 ENCOUNTER — Ambulatory Visit
Admission: RE | Admit: 2022-12-09 | Discharge: 2022-12-09 | Disposition: A | Discharge status: Home / Self Care | Payer: Medicare PPO | Source: Ambulatory Visit | Attending: Nurse Practitioner | Admitting: Nurse Practitioner

## 2022-12-09 DIAGNOSIS — Z87891 Personal history of nicotine dependence: Secondary | ICD-10-CM | POA: Diagnosis not present

## 2022-12-09 DIAGNOSIS — Z720 Tobacco use: Secondary | ICD-10-CM

## 2022-12-13 DIAGNOSIS — H40033 Anatomical narrow angle, bilateral: Secondary | ICD-10-CM | POA: Diagnosis not present

## 2022-12-13 DIAGNOSIS — H2513 Age-related nuclear cataract, bilateral: Secondary | ICD-10-CM | POA: Diagnosis not present

## 2023-01-22 ENCOUNTER — Telehealth: Payer: Self-pay | Admitting: *Deleted

## 2023-01-22 DIAGNOSIS — K5900 Constipation, unspecified: Secondary | ICD-10-CM | POA: Diagnosis not present

## 2023-01-22 DIAGNOSIS — I10 Essential (primary) hypertension: Secondary | ICD-10-CM | POA: Diagnosis not present

## 2023-01-22 DIAGNOSIS — Z1211 Encounter for screening for malignant neoplasm of colon: Secondary | ICD-10-CM | POA: Diagnosis not present

## 2023-01-22 DIAGNOSIS — E782 Mixed hyperlipidemia: Secondary | ICD-10-CM | POA: Diagnosis not present

## 2023-01-22 DIAGNOSIS — Z86010 Personal history of colonic polyps: Secondary | ICD-10-CM | POA: Diagnosis not present

## 2023-01-22 NOTE — Telephone Encounter (Signed)
   Pre-operative Risk Assessment    Patient Name: Tyrone Walton  DOB: [birth year] MRN: [id number]   PT IS GOING TO NEED A NEW PT APPT; LAST SEEN BY CARDIOLOGY 11/16/2018  Request for Surgical Clearance    Procedure:   COLONOSCOPY  Date of Surgery:  Clearance 05/15/23                                 Surgeon:  DR. MANN Surgeon's Group or Practice Name:  [facility] Phone number:  [phone] Fax number:  [phone]   Type of Clearance Requested:   - Medical  - Pharmacy:  Hold Clopidogrel (Plavix)     Type of Anesthesia:   PROPOFOL    Additional requests/questions:    Elpidio Anis   01/22/2023, 5:22 PM

## 2023-01-23 NOTE — Telephone Encounter (Signed)
   Name: Tyrone Walton  DOB: [birth year]  MRN: [id number]  Primary Cardiologist: [name], MD  Chart reviewed as part of pre-operative protocol coverage. Because of Tyrone Walton's past medical history and time since last visit, he will require a follow-up in-office visit in order to better assess preoperative cardiovascular risk. Patient has bot been seen by Dr. Allyson Sabal since February 2020.   Pre-op covering staff: - Please schedule appointment and call patient to inform them. If patient already had an upcoming appointment within acceptable timeframe, please add "pre-op clearance" to the appointment notes so provider is aware. - Please contact requesting surgeon's office via preferred method (i.e, phone, fax) to inform them of need for appointment prior to surgery.   Tyrone Levering, NP  01/23/2023, 11:03 AM

## 2023-01-23 NOTE — Telephone Encounter (Signed)
Spoke with patient who is agreeable to do an office visit on 7/2 at 8:50 pm. I will route to requesting provider's office.

## 2023-02-17 ENCOUNTER — Other Ambulatory Visit: Payer: Self-pay

## 2023-02-17 MED ORDER — ALBUTEROL SULFATE HFA 108 (90 BASE) MCG/ACT IN AERS: 2.0000 | INHALATION_SPRAY | Freq: Four times a day (QID) | RESPIRATORY_TRACT | 2 refills | Status: DC | PRN

## 2023-02-23 ENCOUNTER — Ambulatory Visit (INDEPENDENT_AMBULATORY_CARE_PROVIDER_SITE_OTHER): Payer: Medicare PPO | Admitting: Nurse Practitioner

## 2023-02-23 ENCOUNTER — Encounter: Payer: Self-pay | Admitting: Nurse Practitioner

## 2023-02-23 VITALS — BP 130/62 | HR 59 | Temp 97.8°F | Ht 68.0 in | Wt 161.0 lb

## 2023-02-23 DIAGNOSIS — I700 Atherosclerosis of aorta: Secondary | ICD-10-CM | POA: Diagnosis not present

## 2023-02-23 DIAGNOSIS — I70213 Atherosclerosis of native arteries of extremities with intermittent claudication, bilateral legs: Secondary | ICD-10-CM

## 2023-02-23 DIAGNOSIS — E782 Mixed hyperlipidemia: Secondary | ICD-10-CM | POA: Diagnosis not present

## 2023-02-23 DIAGNOSIS — G4709 Other insomnia: Secondary | ICD-10-CM | POA: Diagnosis not present

## 2023-02-23 DIAGNOSIS — Z79899 Other long term (current) drug therapy: Secondary | ICD-10-CM | POA: Diagnosis not present

## 2023-02-23 DIAGNOSIS — I119 Hypertensive heart disease without heart failure: Secondary | ICD-10-CM | POA: Diagnosis not present

## 2023-02-23 DIAGNOSIS — Z125 Encounter for screening for malignant neoplasm of prostate: Secondary | ICD-10-CM

## 2023-02-23 DIAGNOSIS — Z6824 Body mass index (BMI) 24.0-24.9, adult: Secondary | ICD-10-CM

## 2023-02-23 DIAGNOSIS — Z0001 Encounter for general adult medical examination with abnormal findings: Secondary | ICD-10-CM | POA: Diagnosis not present

## 2023-02-23 DIAGNOSIS — Z0000 Encounter for general adult medical examination without abnormal findings: Secondary | ICD-10-CM

## 2023-02-23 LAB — POCT URINALYSIS DIP (CLINITEK)
Bilirubin, UA: NEGATIVE
Blood, UA: NEGATIVE
Glucose, UA: NEGATIVE mg/dL
Ketones, POC UA: NEGATIVE mg/dL
Leukocytes, UA: NEGATIVE
Nitrite, UA: NEGATIVE
POC PROTEIN,UA: NEGATIVE
Spec Grav, UA: 1.025 (ref 1.010–1.025)
Urobilinogen, UA: 0.2 E.U./dL
pH, UA: 6.5 (ref 5.0–8.0)

## 2023-02-23 NOTE — Assessment & Plan Note (Signed)
His insomnia has resolved, no longer having a sleep issue.

## 2023-02-23 NOTE — Assessment & Plan Note (Signed)
Cholesterol levels are stable, continue statin, tolerating well.

## 2023-02-23 NOTE — Progress Notes (Signed)
Hershal Coria Martin,acting as a Neurosurgeon for Arnette Felts, FNP.,have documented all relevant documentation on the behalf of Arnette Felts, FNP,as directed by  Arnette Felts, FNP while in the presence of Arnette Felts, FNP.   Subjective:     Patient ID: Tyrone Walton , male    DOB: [birth year] , 71 y.o.   MRN: [id number]   No chief complaint on file.   HPI  Patient presents today for HM, patient states compliance with medications and has no other concerns today. Continues to go to Cardiology. He is scheduled to have his colonoscopy this week.   Wt Readings from Last 3 Encounters: 02/23/23 : 161 lb (73 kg) 11/18/22 : 164 lb (74.4 kg) 07/14/22 : 161 lb (73 kg)       Past Medical History:  Diagnosis Date   CAD (coronary artery disease)    Chronic left shoulder pain 02/10/2019   Constipation    ED (erectile dysfunction)    Elevated vitamin B12 level 11/24/2022   Will recheck B12 low goals were elevated at last visit.   Hyperlipidemia    Hypertension    Pain in limb    Peripheral vascular disease (HCC)    claudication   Tobacco abuse 03/13/2015   Tobacco abuse     Family History  Problem Relation Age of Onset   Dementia Mother    Heart failure Mother    Heart attack Brother      Current Outpatient Medications:    acetaminophen (TYLENOL) 325 MG tablet, Take 2 tablets (650 mg total) by mouth every 4 (four) hours as needed for headache or mild pain., Disp: , Rfl:    albuterol (VENTOLIN HFA) 108 (90 Base) MCG/ACT inhaler, Inhale 2 puffs into the lungs every 6 (six) hours as needed for wheezing or shortness of breath., Disp: 8 g, Rfl: 2   aspirin 81 MG chewable tablet, Chew 1 tablet (81 mg total) by mouth daily., Disp: , Rfl:    atorvastatin (LIPITOR) 40 MG tablet, TAKE 1 TABLET(40 MG) BY MOUTH DAILY, Disp: 90 tablet, Rfl: 1   Benzocaine (BOIL EASE MAXIMUM STRENGTH) 20 % OINT, Apply topically., Disp: , Rfl:    calcium carbonate (OSCAL) 1500 (600 Ca) MG TABS tablet, Take 600 mg of  elemental calcium by mouth every other day., Disp: , Rfl:    clopidogrel (PLAVIX) 75 MG tablet, Take 1 tablet (75 mg total) by mouth daily., Disp: 90 tablet, Rfl: 1   MAGNESIUM OXIDE PO, Take 500 mg by mouth daily. , Disp: , Rfl:    Multiple Vitamins-Minerals (ONE-A-DAY MENS 50+ ADVANTAGE) TABS, Take 1 tablet by mouth daily., Disp: , Rfl:    olmesartan-hydrochlorothiazide (BENICAR HCT) 20-12.5 MG tablet, Take 1 tablet by mouth daily., Disp: 90 tablet, Rfl: 1   cyclobenzaprine (FLEXERIL) 10 MG tablet, Take 1 tablet (10 mg total) by mouth 3 (three) times daily as needed for muscle spasms. (Patient not taking: Reported on 02/23/2023), Disp: 30 tablet, Rfl: 0   diclofenac sodium (VOLTAREN) 1 % GEL, Apply 2 g topically 4 (four) times daily. (Patient not taking: Reported on 02/23/2023), Disp: 100 g, Rfl: 3   tiotropium (SPIRIVA HANDIHALER) 18 MCG inhalation capsule, Place 1 capsule (18 mcg total) into inhaler and inhale daily. (Patient not taking: Reported on 02/23/2023), Disp: 90 each, Rfl: 1   No Known Allergies   Men's preventive visit. Patient Health Questionnaire (PHQ-2) is  Flowsheet Row Office Visit from 11/18/2022 in [facility]  PHQ-2 Total Score 0  Patient is on a Regular diet. He stopped drinking coca cola and stopped using sugar in his coffee. He is cutting back on drinking beer. He does have a stationary bike but not using regularly. Marital status: Married. Relevant history for alcohol use is:  Social History   Substance and Sexual Activity  Alcohol Use Yes   Alcohol/week: 6.0 standard drinks of alcohol   Types: 6 Cans of beer per week   Comment: 05/10/2015 "I only drink on the weekends; down from 12 to 6 cans of beer"   Relevant history for tobacco use is:  Social History   Tobacco Use  Smoking Status Former   Packs/day: 0.50   Years: 49.00   Additional pack years: 0.00   Total pack years: 24.50   Types: Cigarettes   Quit date: 04/13/2015    Years since quitting: 7.9  Smokeless Tobacco Never  Tobacco Comments   Currently use Vapor, Quit vaping since December 01, 2020  .   Review of Systems  Constitutional: Negative.   HENT: Negative.    Eyes: Negative.   Respiratory: Negative.    Cardiovascular: Negative.   Gastrointestinal: Negative.   Endocrine: Negative.   Genitourinary: Negative.   Musculoskeletal:  Positive for myalgias (calf intermittently).  Neurological:  Positive for numbness (tingling to bottom of feet when standing for long periods and occassional calf cramp).  Hematological: Negative.   Psychiatric/Behavioral: Negative.       Today's Vitals   02/23/23 0857  BP: 130/62  Pulse: (!) 59  Temp: 97.8 F (36.6 C)  Weight: 161 lb (73 kg)  Height: 5\' 8"  (1.727 m)  PainSc: 0-No pain   Body mass index is 24.48 kg/m.   Objective:  Physical Exam Vitals reviewed.  Constitutional:      General: He is not in acute distress.    Appearance: Normal appearance.  HENT:     Head: Normocephalic and atraumatic.     Right Ear: Hearing, tympanic membrane, ear canal and external ear normal. There is no impacted cerumen.     Left Ear: Hearing, tympanic membrane, ear canal and external ear normal. There is no impacted cerumen.     Nose: Nose normal.     Mouth/Throat:     Mouth: Mucous membranes are moist.  Eyes:     Extraocular Movements: Extraocular movements intact.     Conjunctiva/sclera: Conjunctivae normal.     Pupils: Pupils are equal, round, and reactive to light.  Cardiovascular:     Rate and Rhythm: Normal rate and regular rhythm.     Pulses: Normal pulses.     Heart sounds: Normal heart sounds. No murmur heard. Pulmonary:     Effort: Pulmonary effort is normal. No respiratory distress.     Breath sounds: Normal breath sounds. No wheezing.  Abdominal:     General: Abdomen is flat. Bowel sounds are normal. There is no distension.     Palpations: Abdomen is soft. There is no mass.     Tenderness: There  is no abdominal tenderness.  Genitourinary:    Comments: Prostate exam Deferred - will check PSA  Musculoskeletal:        General: No swelling or tenderness. Normal range of motion.     Cervical back: Normal range of motion and neck supple. No tenderness.  Skin:    General: Skin is warm and dry.     Capillary Refill: Capillary refill takes less than 2 seconds.  Neurological:     General: No focal deficit present.  Mental Status: He is alert and oriented to person, place, and time.     Cranial Nerves: No cranial nerve deficit.  Psychiatric:        Mood and Affect: Mood normal.        Behavior: Behavior normal.        Thought Content: Thought content normal.        Judgment: Judgment normal.         Assessment And Plan:    1. Encounter for annual health examination Behavior modifications discussed and diet history reviewed.   Pt will continue to exercise regularly and modify diet with low GI, plant based foods and decrease intake of processed foods.  Recommend intake of daily multivitamin, Vitamin D, and calcium.  Recommend colonoscopy for preventive screenings, as well as recommend immunizations that include influenza, TDAP, and Shingles Health Maintenance  Topic Date Due   Colon Cancer Screening  02/14/2021   Medicare Annual Wellness Visit  04/04/2023   COVID-19 Vaccine (6 - 2023-24 season) 06/12/2023*   Flu Shot  05/14/2023   Screening for Lung Cancer  12/10/2023   DTaP/Tdap/Td vaccine (2 - Td or Tdap) 03/13/2026   Pneumonia Vaccine  Completed   Hepatitis C Screening  Completed   Zoster (Shingles) Vaccine  Completed   HPV Vaccine  Aged Out  *Topic was postponed. The date shown is not the original due date.    2. Encounter for prostate cancer screening - PSA, total and free; Future  3. Hypertensive heart disease without congestive heart failure Comments: Blood pressure is controlled, continue current medications.  Continue following lifestyle modifications. EKG done  results as below Sinus Bradycardia -occasional ectopic ventricular beat   WITHIN NORMAL LIMITS Heart rate 59  - EKG 12-Lead - Microalbumin / creatinine urine ratio - POCT URINALYSIS DIP (CLINITEK) - Basic metabolic panel; Future  4. Aortic atherosclerosis (HCC) - Lipid panel; Future  5. Atherosclerosis of native artery of both lower extremities with intermittent claudication (HCC) Comments: Continue current medications.  No current issues.  6. Mixed hyperlipidemia Comments: Cholesterol levels are stable.  Continue statin, tolerating well. - Lipid panel; Future  7. Other insomnia Comments: Sleep has improved.  No changes at this time.  8. BMI 24.0-24.9, adult  9. Other long term (current) drug therapy - CBC with Differential/Platelet; Future    Patient was given opportunity to ask questions. Patient verbalized understanding of the plan and was able to repeat key elements of the plan. All questions were answered to their satisfaction.   Arnette Felts, FNP   I, Arnette Felts, FNP, have reviewed all documentation for this visit. The documentation on 02/23/23 for the exam, diagnosis, procedures, and orders are all accurate and complete.   THE PATIENT IS ENCOURAGED TO PRACTICE SOCIAL DISTANCING DUE TO THE COVID-19 PANDEMIC.

## 2023-02-23 NOTE — Assessment & Plan Note (Addendum)
Blood pressure is controlled, continue current medications. EKG done with Sinus Bradycardia -occasional ectopic ventricular beat, WITHIN NORMAL LIMITS  Heart rate 59

## 2023-02-23 NOTE — Assessment & Plan Note (Signed)
Continue Plavix, does have intermittent calf pain. Advised to make sure wearing support socks.

## 2023-02-23 NOTE — Assessment & Plan Note (Signed)
Continue statin, tolerating well

## 2023-02-23 NOTE — Patient Instructions (Signed)
Health Maintenance, Male Adopting a healthy lifestyle and getting preventive care are important in promoting health and wellness. Ask your health care provider about: The right schedule for you to have regular tests and exams. Things you can do on your own to prevent diseases and keep yourself healthy. What should I know about diet, weight, and exercise? Eat a healthy diet  Eat a diet that includes plenty of vegetables, fruits, low-fat dairy products, and lean protein. Do not eat a lot of foods that are high in solid fats, added sugars, or sodium. Maintain a healthy weight Body mass index (BMI) is a measurement that can be used to identify possible weight problems. It estimates body fat based on height and weight. Your health care provider can help determine your BMI and help you achieve or maintain a healthy weight. Get regular exercise Get regular exercise. This is one of the most important things you can do for your health. Most adults should: Exercise for at least 150 minutes each week. The exercise should increase your heart rate and make you sweat (moderate-intensity exercise). Do strengthening exercises at least twice a week. This is in addition to the moderate-intensity exercise. Spend less time sitting. Even light physical activity can be beneficial. Watch cholesterol and blood lipids Have your blood tested for lipids and cholesterol at 72 years of age, then have this test every 5 years. You may need to have your cholesterol levels checked more often if: Your lipid or cholesterol levels are high. You are older than 72 years of age. You are at high risk for heart disease. What should I know about cancer screening? Many types of cancers can be detected early and may often be prevented. Depending on your health history and family history, you may need to have cancer screening at various ages. This may include screening for: Colorectal cancer. Prostate cancer. Skin cancer. Lung  cancer. What should I know about heart disease, diabetes, and high blood pressure? Blood pressure and heart disease High blood pressure causes heart disease and increases the risk of stroke. This is more likely to develop in people who have high blood pressure readings or are overweight. Talk with your health care provider about your target blood pressure readings. Have your blood pressure checked: Every 3-5 years if you are 18-39 years of age. Every year if you are 40 years old or older. If you are between the ages of 65 and 75 and are a current or former smoker, ask your health care provider if you should have a one-time screening for abdominal aortic aneurysm (AAA). Diabetes Have regular diabetes screenings. This checks your fasting blood sugar level. Have the screening done: Once every three years after age 45 if you are at a normal weight and have a low risk for diabetes. More often and at a younger age if you are overweight or have a high risk for diabetes. What should I know about preventing infection? Hepatitis B If you have a higher risk for hepatitis B, you should be screened for this virus. Talk with your health care provider to find out if you are at risk for hepatitis B infection. Hepatitis C Blood testing is recommended for: Everyone born from 1945 through 1965. Anyone with known risk factors for hepatitis C. Sexually transmitted infections (STIs) You should be screened each year for STIs, including gonorrhea and chlamydia, if: You are sexually active and are younger than 72 years of age. You are older than 72 years of age and your   health care provider tells you that you are at risk for this type of infection. Your sexual activity has changed since you were last screened, and you are at increased risk for chlamydia or gonorrhea. Ask your health care provider if you are at risk. Ask your health care provider about whether you are at high risk for HIV. Your health care provider  may recommend a prescription medicine to help prevent HIV infection. If you choose to take medicine to prevent HIV, you should first get tested for HIV. You should then be tested every 3 months for as long as you are taking the medicine. Follow these instructions at home: Alcohol use Do not drink alcohol if your health care provider tells you not to drink. If you drink alcohol: Limit how much you have to 0-2 drinks a day. Know how much alcohol is in your drink. In the [place] one drink equals one 12 oz bottle of beer (355 mL), one 5 oz glass of wine (148 mL), or one 1 oz glass of hard liquor (44 mL). Lifestyle Do not use any products that contain nicotine or tobacco. These products include cigarettes, chewing tobacco, and vaping devices, such as e-cigarettes. If you need help quitting, ask your health care provider. Do not use street drugs. Do not share needles. Ask your health care provider for help if you need support or information about quitting drugs. General instructions Schedule regular health, dental, and eye exams. Stay current with your vaccines. Tell your health care provider if: You often feel depressed. You have ever been abused or do not feel safe at home. Summary Adopting a healthy lifestyle and getting preventive care are important in promoting health and wellness. Follow your health care provider's instructions about healthy diet, exercising, and getting tested or screened for diseases. Follow your health care provider's instructions on monitoring your cholesterol and blood pressure. This information is not intended to replace advice given to you by your health care provider. Make sure you discuss any questions you have with your health care provider. Document Revised: 02/18/2021 Document Reviewed: 02/18/2021 Elsevier Patient Education  2023 Elsevier Inc.  

## 2023-02-24 LAB — MICROALBUMIN / CREATININE URINE RATIO
Creatinine, Urine: 77.5 mg/dL
Microalb/Creat Ratio: 5 mg/g creat (ref 0–29)
Microalbumin, Urine: 3.9 ug/mL

## 2023-02-25 ENCOUNTER — Other Ambulatory Visit: Payer: Medicare PPO

## 2023-02-25 DIAGNOSIS — I700 Atherosclerosis of aorta: Secondary | ICD-10-CM | POA: Diagnosis not present

## 2023-02-25 DIAGNOSIS — Z79899 Other long term (current) drug therapy: Secondary | ICD-10-CM | POA: Diagnosis not present

## 2023-02-25 DIAGNOSIS — E782 Mixed hyperlipidemia: Secondary | ICD-10-CM | POA: Diagnosis not present

## 2023-02-25 DIAGNOSIS — Z125 Encounter for screening for malignant neoplasm of prostate: Secondary | ICD-10-CM | POA: Diagnosis not present

## 2023-02-25 DIAGNOSIS — I119 Hypertensive heart disease without heart failure: Secondary | ICD-10-CM | POA: Diagnosis not present

## 2023-02-26 LAB — CBC WITH DIFFERENTIAL/PLATELET
Basophils Absolute: 0 10*3/uL (ref 0.0–0.2)
Basos: 1 %
EOS (ABSOLUTE): 0.1 10*3/uL (ref 0.0–0.4)
Eos: 3 %
Hematocrit: 45.1 % (ref 37.5–51.0)
Hemoglobin: 14.7 g/dL (ref 13.0–17.7)
Immature Grans (Abs): 0 10*3/uL (ref 0.0–0.1)
Immature Granulocytes: 0 %
Lymphocytes Absolute: 1.4 10*3/uL (ref 0.7–3.1)
Lymphs: 30 %
MCH: 29.9 pg (ref 26.6–33.0)
MCHC: 32.6 g/dL (ref 31.5–35.7)
MCV: 92 fL (ref 79–97)
Monocytes Absolute: 0.6 10*3/uL (ref 0.1–0.9)
Monocytes: 14 %
Neutrophils Absolute: 2.4 10*3/uL (ref 1.4–7.0)
Neutrophils: 52 %
Platelets: 212 10*3/uL (ref 150–450)
RBC: 4.91 x10E6/uL (ref 4.14–5.80)
RDW: 13.3 % (ref 11.6–15.4)
WBC: 4.6 10*3/uL (ref 3.4–10.8)

## 2023-02-26 LAB — LIPID PANEL
Chol/HDL Ratio: 2.7 ratio (ref 0.0–5.0)
Cholesterol, Total: 189 mg/dL (ref 100–199)
HDL: 69 mg/dL (ref >39)
LDL Chol Calc (NIH): 107 mg/dL — ABNORMAL HIGH (ref 0–99)
Triglycerides: 69 mg/dL (ref 0–149)
VLDL Cholesterol Cal: 13 mg/dL (ref 5–40)

## 2023-02-26 LAB — BASIC METABOLIC PANEL
BUN/Creatinine Ratio: 14 (ref 10–24)
BUN: 14 mg/dL (ref 8–27)
CO2: 24 mmol/L (ref 20–29)
Calcium: 9.6 mg/dL (ref 8.6–10.2)
Chloride: 104 mmol/L (ref 96–106)
Creatinine, Ser: 1.01 mg/dL (ref 0.76–1.27)
Glucose: 88 mg/dL (ref 70–99)
Potassium: 5.1 mmol/L (ref 3.5–5.2)
Sodium: 143 mmol/L (ref 134–144)
eGFR: 79 mL/min/{1.73_m2} (ref >59)

## 2023-02-26 LAB — PSA, TOTAL AND FREE
PSA, Free Pct: 34 %
PSA, Free: 0.68 ng/mL
Prostate Specific Ag, Serum: 2 ng/mL (ref 0.0–4.0)

## 2023-03-24 ENCOUNTER — Other Ambulatory Visit: Payer: Self-pay

## 2023-03-24 DIAGNOSIS — E782 Mixed hyperlipidemia: Secondary | ICD-10-CM

## 2023-03-24 MED ORDER — ATORVASTATIN CALCIUM 40 MG PO TABS
ORAL_TABLET | ORAL | 1 refills | Status: DC
Start: 2023-03-24 — End: 2023-06-17

## 2023-04-11 NOTE — Progress Notes (Deleted)
Cardiology Clinic Note   Patient Name: Tyrone Walton Date of Encounter: 04/11/2023  Primary Care Provider:  Arnette Felts, FNP Primary Cardiologist:  [name], MD  Patient Profile    Tyrone Walton 72 year old male presents to the clinic today for follow-up evaluation of his peripheral vascular disease and preoperative cardiac evaluation.  Past Medical History    Past Medical History:  Diagnosis Date   CAD (coronary artery disease)    Chronic left shoulder pain 02/10/2019   Constipation    ED (erectile dysfunction)    Elevated vitamin B12 level 11/24/2022   Will recheck B12 low goals were elevated at last visit.   Hyperlipidemia    Hypertension    Pain in limb    Peripheral vascular disease (HCC)    claudication   Tobacco abuse 03/13/2015   Tobacco abuse   Past Surgical History:  Procedure Laterality Date   BACK SURGERY     LUMBAR DISC SURGERY  ~ 2013   PERIPHERAL VASCULAR CATHETERIZATION N/A 04/12/2015   Procedure: Lower Extremity Angiography;  Surgeon: Runell Gess, MD;  Location: [facility] INVASIVE CV LAB;  Service: Cardiovascular;  Laterality: N/A;   PERIPHERAL VASCULAR CATHETERIZATION Right 05/10/2015   Procedure: Peripheral Vascular Intervention;  Surgeon: Runell Gess, MD;  Location: [facility] INVASIVE CV LAB;  Service: Cardiovascular;  Laterality: Right;  rt ext iliac stent    Allergies  No Known Allergies  History of Present Illness    Tyrone Walton has a PMH of peripheral vascular disease, atherosclerosis of native arteries of extremities with intermittent claudication, essential hypertension, aortic atherosclerosis, hyperlipidemia, constipation, insomnia, and is status post arterial stenting 04/12/2015 with PTA/stent of left external iliac.  His Myoview stress testing 06/02/2013 was negative.  His brother died of MI at age 8.  He underwent angiography by [facility] 04/12/2015.  He received left external iliac artery stenting.  He also was noted to  have a long segmental occluded calcified left SFA which was unable to be crossed.  His claudication improved.  On 05/10/2015 he underwent repeat angiogram with the intent of fixing his right SFA however, he was noted to have significant disease in his entire right external iliac artery which was stented.  He was noted to have significant improvement in his right ABI.  He denied claudication.  He was able to perform work without limitation.  He had stopped smoking.  He was seen in follow-up on 11/16/2018 by Dr. Allyson Sabal.  He continued to do well at that time.  He denied claudication.  He denied chest pain and shortness of breath.  His LDL was not at goal.  He admitted to dietary indiscretion.  His Doppler studies 07/30/2018 showed continued patency in his iliac stents.  He presents to the clinic today for follow-up evaluation and preoperative cardiac evaluation.  He states***.  *** denies chest pain, shortness of breath, lower extremity edema, fatigue, palpitations, melena, hematuria, hemoptysis, diaphoresis, weakness, presyncope, syncope, orthopnea, and PND.  Peripheral arterial disease-denies claudication.  Was noted to have significant improvement after angiography and iliac stenting. Repeat lower extremity arterial send ABIs. Heart healthy low-sodium high-fiber diet Continue aspirin, atorvastatin, Plavix  Essential hypertension-BP today***. Maintain blood pressure log Heart healthy low-sodium diet Continue olmesartan, hydrochlorothiazide  Preoperative cardiac evaluation-colonoscopy, 05/15/2023, Dr. Loreta Ave, [facility]     Primary Cardiologist: [name], MD  Chart reviewed as part of pre-operative protocol coverage. Given past medical history and time since last visit, based on ACC/AHA guidelines, Tyrone Candelaria  Walton would be at acceptable risk for the planned procedure without further cardiovascular testing.   His Plavix may be held for 5 days prior to his procedure.  Please resume  as soon as hemostasis is achieved.  Patient was advised that if he/she*** develops new symptoms prior to surgery to contact our office to arrange a follow-up appointment.  He verbalized understanding.  I will route this recommendation to the requesting party via Epic fax function and remove from pre-op pool.     Disposition: Follow-up with Dr. Allyson Sabal or me in 12 months.  Home Medications    Prior to Admission medications   Medication Sig Start Date End Date Taking? Authorizing Provider  acetaminophen (TYLENOL) 325 MG tablet Take 2 tablets (650 mg total) by mouth every 4 (four) hours as needed for headache or mild pain. 04/13/15   Leone Brand, NP  albuterol (VENTOLIN HFA) 108 (90 Base) MCG/ACT inhaler Inhale 2 puffs into the lungs every 6 (six) hours as needed for wheezing or shortness of breath. 02/17/23   Arnette Felts, FNP  aspirin 81 MG chewable tablet Chew 1 tablet (81 mg total) by mouth daily. 05/11/15   Azalee Course, PA  atorvastatin (LIPITOR) 40 MG tablet Take 1 tablet by mouth daily. 03/24/23   Arnette Felts, FNP  Benzocaine (BOIL EASE MAXIMUM STRENGTH) 20 % OINT Apply topically.    [provider]  calcium carbonate (OSCAL) 1500 (600 Ca) MG TABS tablet Take 600 mg of elemental calcium by mouth every other day.    [provider]  clopidogrel (PLAVIX) 75 MG tablet Take 1 tablet (75 mg total) by mouth daily. 08/28/22   Arnette Felts, FNP  cyclobenzaprine (FLEXERIL) 10 MG tablet Take 1 tablet (10 mg total) by mouth 3 (three) times daily as needed for muscle spasms. Patient not taking: Reported on 02/23/2023 02/10/19   Arnette Felts, FNP  diclofenac sodium (VOLTAREN) 1 % GEL Apply 2 g topically 4 (four) times daily. Patient not taking: Reported on 02/23/2023 02/03/19   Arnette Felts, FNP  MAGNESIUM OXIDE PO Take 500 mg by mouth daily.     [provider]  Multiple Vitamins-Minerals (ONE-A-DAY MENS 50+ ADVANTAGE) TABS Take 1 tablet by mouth daily.    [provider]  olmesartan-hydrochlorothiazide (BENICAR HCT) 20-12.5 MG tablet Take 1 tablet by mouth daily. 04/14/22   Arnette Felts, FNP  tiotropium (SPIRIVA HANDIHALER) 18 MCG inhalation capsule Place 1 capsule (18 mcg total) into inhaler and inhale daily. Patient not taking: Reported on 02/23/2023 02/18/22   Arnette Felts, FNP    Family History    Family History  Problem Relation Age of Onset   Dementia Mother    Heart failure Mother    Heart attack Brother    He indicated that his mother is alive. He indicated that his father is deceased. He indicated that his brother is deceased.  Social History    Social History   Socioeconomic History   Marital status: Married    Spouse name: Not on file   Number of children: Not on file   Years of education: Not on file   Highest education level: Not on file  Occupational History   Occupation: retired   Occupation: part time employed  Tobacco Use   Smoking status: Former    Packs/day: 0.50    Years: 49.00    Additional pack years: 0.00    Total pack years: 24.50    Types: Cigarettes    Quit date: 04/13/2015  Years since quitting: 8.0   Smokeless tobacco: Never   Tobacco comments:    Currently use Vapor, Quit vaping since December 01, 2020  Vaping Use   Vaping Use: Former   Substances: Nicotine, Flavoring  Substance and Sexual Activity   Alcohol use: Yes    Alcohol/week: 6.0 standard drinks of alcohol    Types: 6 Cans of beer per week    Comment: 05/10/2015 "I only drink on the weekends; down from 12 to 6 cans of beer"   Drug use: No   Sexual activity: Yes  Other Topics Concern   Not on file  Social History Narrative   Not on file   Social Determinants of Health   Financial Resource Strain: Low Risk  (04/03/2022)   Overall Financial Resource Strain (CARDIA)    Difficulty of Paying Living Expenses: Not hard at all  Food Insecurity: No Food Insecurity (04/03/2022)   Hunger Vital Sign    Worried About Running Out of Food in  the Last Year: Never true    Ran Out of Food in the Last Year: Never true  Transportation Needs: No Transportation Needs (04/03/2022)   PRAPARE - Administrator, Civil Service (Medical): No    Lack of Transportation (Non-Medical): No  Physical Activity: Inactive (04/03/2022)   Exercise Vital Sign    Days of Exercise per Week: 0 days    Minutes of Exercise per Session: 0 min  Stress: No Stress Concern Present (04/03/2022)   [employer] of Occupational Health - Occupational Stress Questionnaire    Feeling of Stress : Not at all  Social Connections: Not on file  Intimate Partner Violence: Not At Risk (02/03/2019)   Humiliation, Afraid, Rape, and Kick questionnaire    Fear of Current or Ex-Partner: No    Emotionally Abused: No    Physically Abused: No    Sexually Abused: No     Review of Systems    General:  No chills, fever, night sweats or weight changes.  Cardiovascular:  No chest pain, dyspnea on exertion, edema, orthopnea, palpitations, paroxysmal nocturnal dyspnea. Dermatological: No rash, lesions/masses Respiratory: No cough, dyspnea Urologic: No hematuria, dysuria Abdominal:   No nausea, vomiting, diarrhea, bright red blood per rectum, melena, or hematemesis Neurologic:  No visual changes, wkns, changes in mental status. All other systems reviewed and are otherwise negative except as noted above.  Physical Exam    VS:  There were no vitals taken for this visit. , BMI There is no height or weight on file to calculate BMI. GEN: Well nourished, well developed, in no acute distress. HEENT: normal. Neck: Supple, no JVD, carotid bruits, or masses. Cardiac: RRR, no murmurs, rubs, or gallops. No clubbing, cyanosis, edema.  Radials/DP/PT 2+ and equal bilaterally.  Respiratory:  Respirations regular and unlabored, clear to auscultation bilaterally. GI: Soft, nontender, nondistended, BS + x 4. MS: no deformity or atrophy. Skin: warm and dry, no rash. Neuro:  Strength  and sensation are intact. Psych: Normal affect.  Accessory Clinical Findings    Recent Labs: 11/18/2022: TSH 0.676 02/25/2023: BUN 14; Creatinine, Ser 1.01; Hemoglobin 14.7; Platelets 212; Potassium 5.1; Sodium 143   Recent Lipid Panel    Component Value Date/Time   CHOL 189 02/25/2023 0839   TRIG 69 02/25/2023 0839   HDL 69 02/25/2023 0839   CHOLHDL 2.7 02/25/2023 0839   LDLCALC 107 (H) 02/25/2023 0839    No BP recorded.  {Refresh Note OR Click here to enter BP  :1}***  ECG personally reviewed by me today- *** - No acute changes      Assessment & Plan   1.  ***   Thomasene Ripple. Toshiko Kemler NP-C     04/11/2023, 7:05 PM [facility] [address] Office [phone] Fax [phone]    I spent***minutes examining this patient, reviewing medications, and using patient centered shared decision making involving her cardiac care.  Prior to her visit I spent greater than 20 minutes reviewing her past medical history,  medications, and prior cardiac tests.

## 2023-04-14 ENCOUNTER — Ambulatory Visit: Payer: Medicare PPO | Attending: General Practice | Admitting: General Practice

## 2023-04-20 NOTE — Progress Notes (Unsigned)
Cardiology Clinic Note   Patient Name: CLEOPHIS PRENDES Date of Encounter: 04/20/2023  Primary Care Provider:  Arnette Felts, FNP Primary Cardiologist:  [name], MD  Patient Profile    [name] 72 year old male presents to the clinic today for follow-up evaluation of his peripheral vascular disease and preoperative cardiac evaluation.  Past Medical History    Past Medical History:  Diagnosis Date   CAD (coronary artery disease)    Chronic left shoulder pain 02/10/2019   Constipation    ED (erectile dysfunction)    Elevated vitamin B12 level 11/24/2022   Will recheck B12 low goals were elevated at last visit.   Hyperlipidemia    Hypertension    Pain in limb    Peripheral vascular disease (HCC)    claudication   Tobacco abuse 03/13/2015   Tobacco abuse   Past Surgical History:  Procedure Laterality Date   BACK SURGERY     LUMBAR DISC SURGERY  ~ 2013   PERIPHERAL VASCULAR CATHETERIZATION N/A 04/12/2015   Procedure: Lower Extremity Angiography;  Surgeon: Runell Gess, MD;  Location: [facility] INVASIVE CV LAB;  Service: Cardiovascular;  Laterality: N/A;   PERIPHERAL VASCULAR CATHETERIZATION Right 05/10/2015   Procedure: Peripheral Vascular Intervention;  Surgeon: Runell Gess, MD;  Location: [facility] INVASIVE CV LAB;  Service: Cardiovascular;  Laterality: Right;  rt ext iliac stent    Allergies  No Known Allergies  History of Present Illness    ROLLAN ELSENPETER has a PMH of peripheral vascular disease, atherosclerosis of native arteries of extremities with intermittent claudication, essential hypertension, aortic atherosclerosis, hyperlipidemia, constipation, insomnia, and is status post arterial stenting 04/12/2015 with PTA/stent of left external iliac.  His Myoview stress testing 06/02/2013 was negative.  His brother died of MI at age 78.  He underwent angiography by [facility] 04/12/2015.  He received left external iliac artery stenting.  He also was noted to have  a long segmental occluded calcified left SFA which was unable to be crossed.  His claudication improved.  On 05/10/2015 he underwent repeat angiogram with the intent of fixing his right SFA however, he was noted to have significant disease in his entire right external iliac artery which was stented.  He was noted to have significant improvement in his right ABI.  He denied claudication.  He was able to perform work without limitation.  He had stopped smoking.  He was seen in follow-up on 11/16/2018 by Dr. Allyson Sabal.  He continued to do well at that time.  He denied claudication.  He denied chest pain and shortness of breath.  His LDL was not at goal.  He admitted to dietary indiscretion.  His Doppler studies 07/30/2018 showed continued patency in his iliac stents.  He presents to the clinic today for follow-up evaluation and preoperative cardiac evaluation.  He states***.  *** denies chest pain, shortness of breath, lower extremity edema, fatigue, palpitations, melena, hematuria, hemoptysis, diaphoresis, weakness, presyncope, syncope, orthopnea, and PND.  Peripheral arterial disease-denies claudication.  Was noted to have significant improvement after angiography and iliac stenting. Repeat lower extremity arterial send ABIs. Heart healthy low-sodium high-fiber diet Continue aspirin, atorvastatin, Plavix  Essential hypertension-BP today***. Maintain blood pressure log Heart healthy low-sodium diet Continue olmesartan, hydrochlorothiazide  Preoperative cardiac evaluation-colonoscopy, 05/15/2023, Dr. Loreta Ave, [facility]     Primary Cardiologist: [name], MD  Chart reviewed as part of pre-operative protocol coverage. Given past medical history and time since last visit, based on ACC/AHA guidelines, Willian Casares  Tulloch would be at acceptable risk for the planned procedure without further cardiovascular testing.   His Plavix may be held for 5 days prior to his procedure.  Please resume as  soon as hemostasis is achieved.  Patient was advised that if he/she*** develops new symptoms prior to surgery to contact our office to arrange a follow-up appointment.  He verbalized understanding.  I will route this recommendation to the requesting party via Epic fax function and remove from pre-op pool.     Disposition: Follow-up with Dr. Allyson Sabal or me in 12 months.  Home Medications    Prior to Admission medications   Medication Sig Start Date End Date Taking? Authorizing Provider  acetaminophen (TYLENOL) 325 MG tablet Take 2 tablets (650 mg total) by mouth every 4 (four) hours as needed for headache or mild pain. 04/13/15   Leone Brand, NP  albuterol (VENTOLIN HFA) 108 (90 Base) MCG/ACT inhaler Inhale 2 puffs into the lungs every 6 (six) hours as needed for wheezing or shortness of breath. 02/17/23   Arnette Felts, FNP  aspirin 81 MG chewable tablet Chew 1 tablet (81 mg total) by mouth daily. 05/11/15   Azalee Course, PA  atorvastatin (LIPITOR) 40 MG tablet Take 1 tablet by mouth daily. 03/24/23   Arnette Felts, FNP  Benzocaine (BOIL EASE MAXIMUM STRENGTH) 20 % OINT Apply topically.    [provider]  calcium carbonate (OSCAL) 1500 (600 Ca) MG TABS tablet Take 600 mg of elemental calcium by mouth every other day.    [provider]  clopidogrel (PLAVIX) 75 MG tablet Take 1 tablet (75 mg total) by mouth daily. 08/28/22   Arnette Felts, FNP  cyclobenzaprine (FLEXERIL) 10 MG tablet Take 1 tablet (10 mg total) by mouth 3 (three) times daily as needed for muscle spasms. Patient not taking: Reported on 02/23/2023 02/10/19   Arnette Felts, FNP  diclofenac sodium (VOLTAREN) 1 % GEL Apply 2 g topically 4 (four) times daily. Patient not taking: Reported on 02/23/2023 02/03/19   Arnette Felts, FNP  MAGNESIUM OXIDE PO Take 500 mg by mouth daily.     [provider]  Multiple Vitamins-Minerals (ONE-A-DAY MENS 50+ ADVANTAGE) TABS Take 1 tablet by mouth daily.    [provider]  olmesartan-hydrochlorothiazide (BENICAR HCT) 20-12.5 MG tablet Take 1 tablet by mouth daily. 04/14/22   Arnette Felts, FNP  tiotropium (SPIRIVA HANDIHALER) 18 MCG inhalation capsule Place 1 capsule (18 mcg total) into inhaler and inhale daily. Patient not taking: Reported on 02/23/2023 02/18/22   Arnette Felts, FNP    Family History    Family History  Problem Relation Age of Onset   Dementia Mother    Heart failure Mother    Heart attack Brother    He indicated that his mother is alive. He indicated that his father is deceased. He indicated that his brother is deceased.  Social History    Social History   Socioeconomic History   Marital status: Married    Spouse name: Not on file   Number of children: Not on file   Years of education: Not on file   Highest education level: Not on file  Occupational History   Occupation: retired   Occupation: part time employed  Tobacco Use   Smoking status: Former    Packs/day: 0.50    Years: 49.00    Additional pack years: 0.00    Total pack years: 24.50    Types: Cigarettes    Quit date: 04/13/2015  Years since quitting: 8.0   Smokeless tobacco: Never   Tobacco comments:    Currently use Vapor, Quit vaping since December 01, 2020  Vaping Use   Vaping Use: Former   Substances: Nicotine, Flavoring  Substance and Sexual Activity   Alcohol use: Yes    Alcohol/week: 6.0 standard drinks of alcohol    Types: 6 Cans of beer per week    Comment: 05/10/2015 "I only drink on the weekends; down from 12 to 6 cans of beer"   Drug use: No   Sexual activity: Yes  Other Topics Concern   Not on file  Social History Narrative   Not on file   Social Determinants of Health   Financial Resource Strain: Low Risk  (04/03/2022)   Overall Financial Resource Strain (CARDIA)    Difficulty of Paying Living Expenses: Not hard at all  Food Insecurity: No Food Insecurity (04/03/2022)   Hunger Vital Sign    Worried About Running Out of Food in the Last  Year: Never true    Ran Out of Food in the Last Year: Never true  Transportation Needs: No Transportation Needs (04/03/2022)   PRAPARE - Administrator, Civil Service (Medical): No    Lack of Transportation (Non-Medical): No  Physical Activity: Inactive (04/03/2022)   Exercise Vital Sign    Days of Exercise per Week: 0 days    Minutes of Exercise per Session: 0 min  Stress: No Stress Concern Present (04/03/2022)   [employer] of Occupational Health - Occupational Stress Questionnaire    Feeling of Stress : Not at all  Social Connections: Not on file  Intimate Partner Violence: Not At Risk (02/03/2019)   Humiliation, Afraid, Rape, and Kick questionnaire    Fear of Current or Ex-Partner: No    Emotionally Abused: No    Physically Abused: No    Sexually Abused: No     Review of Systems    General:  No chills, fever, night sweats or weight changes.  Cardiovascular:  No chest pain, dyspnea on exertion, edema, orthopnea, palpitations, paroxysmal nocturnal dyspnea. Dermatological: No rash, lesions/masses Respiratory: No cough, dyspnea Urologic: No hematuria, dysuria Abdominal:   No nausea, vomiting, diarrhea, bright red blood per rectum, melena, or hematemesis Neurologic:  No visual changes, wkns, changes in mental status. All other systems reviewed and are otherwise negative except as noted above.  Physical Exam    VS:  There were no vitals taken for this visit. , BMI There is no height or weight on file to calculate BMI. GEN: Well nourished, well developed, in no acute distress. HEENT: normal. Neck: Supple, no JVD, carotid bruits, or masses. Cardiac: RRR, no murmurs, rubs, or gallops. No clubbing, cyanosis, edema.  Radials/DP/PT 2+ and equal bilaterally.  Respiratory:  Respirations regular and unlabored, clear to auscultation bilaterally. GI: Soft, nontender, nondistended, BS + x 4. MS: no deformity or atrophy. Skin: warm and dry, no rash. Neuro:  Strength and  sensation are intact. Psych: Normal affect.  Accessory Clinical Findings    Recent Labs: 11/18/2022: TSH 0.676 02/25/2023: BUN 14; Creatinine, Ser 1.01; Hemoglobin 14.7; Platelets 212; Potassium 5.1; Sodium 143   Recent Lipid Panel    Component Value Date/Time   CHOL 189 02/25/2023 0839   TRIG 69 02/25/2023 0839   HDL 69 02/25/2023 0839   CHOLHDL 2.7 02/25/2023 0839   LDLCALC 107 (H) 02/25/2023 0839    No BP recorded.  {Refresh Note OR Click here to enter BP  :1}***  ECG personally reviewed by me today- *** - No acute changes      Assessment & Plan   1.  ***   Thomasene Ripple. Estefano Victory NP-C     04/20/2023, 7:57 AM [facility] [address] Office [phone] Fax [phone]    I spent***minutes examining this patient, reviewing medications, and using patient centered shared decision making involving her cardiac care.  Prior to her visit I spent greater than 20 minutes reviewing her past medical history,  medications, and prior cardiac tests.

## 2023-04-22 ENCOUNTER — Encounter: Payer: Self-pay | Admitting: General Practice

## 2023-04-22 ENCOUNTER — Ambulatory Visit: Payer: Medicare PPO | Attending: General Practice | Admitting: General Practice

## 2023-04-22 VITALS — BP 134/84 | HR 73 | Ht 68.0 in | Wt 162.6 lb

## 2023-04-22 DIAGNOSIS — I10 Essential (primary) hypertension: Secondary | ICD-10-CM | POA: Diagnosis not present

## 2023-04-22 DIAGNOSIS — Z01818 Encounter for other preprocedural examination: Secondary | ICD-10-CM | POA: Diagnosis not present

## 2023-04-22 DIAGNOSIS — I739 Peripheral vascular disease, unspecified: Secondary | ICD-10-CM | POA: Diagnosis not present

## 2023-04-22 NOTE — Patient Instructions (Signed)
Medication Instructions:  The current medical regimen is effective;  continue present plan and medications as directed. Please refer to the Current Medication list given to you today.  *If you need a refill on your cardiac medications before your next appointment, please call your pharmacy*  Lab Work: NONE If you have labs (blood work) drawn today and your tests are completely normal, you will receive your results only by:  MyChart Message (if you have MyChart) OR  A paper copy in the mail If you have any lab test that is abnormal or we need to change your treatment, we will call you to review the results.  Other Instructions MAINTAIN PHYSICAL ACTIVITY PLEASE READ AND FOLLOW ATTACHED  SALTY 6   Follow-Up: At [facility], you and your health needs are our priority.  As part of our continuing mission to provide you with exceptional heart care, we have created designated Provider Care Teams.  These Care Teams include your primary Cardiologist (physician) and Advanced Practice Providers (APPs -  Physician Assistants and Nurse Practitioners) who all work together to provide you with the care you need, when you need it.  Your next appointment:   12 month(s)  Provider:   [name], MD

## 2023-04-23 ENCOUNTER — Ambulatory Visit: Payer: Medicare PPO | Admitting: Nurse Practitioner

## 2023-04-23 NOTE — Progress Notes (Deleted)
Madelaine Bhat, CMA,acting as a Neurosurgeon for Arnette Felts, FNP.,have documented all relevant documentation on the behalf of Arnette Felts, FNP,as directed by  Arnette Felts, FNP while in the presence of Arnette Felts, FNP.  Subjective:  Patient ID: Tyrone Walton , male    DOB: 1950/11/25 , 72 y.o.   MRN: [id number]  No chief complaint on file.   HPI  Patient presents today for a BP and DM follow up, patient reports complaince with medications. Patient denies any chest pain, SOB, or headaches, Patient has no other concerns today.     Past Medical History:  Diagnosis Date  . CAD (coronary artery disease)   . Chronic left shoulder pain 02/10/2019  . Constipation   . ED (erectile dysfunction)   . Elevated vitamin B12 level 11/24/2022   Will recheck B12 low goals were elevated at last visit.  Marland Kitchen Hyperlipidemia   . Hypertension   . Pain in limb   . Peripheral vascular disease (HCC)    claudication  . Tobacco abuse 03/13/2015   Tobacco abuse     Family History  Problem Relation Age of Onset  . Dementia Mother   . Heart failure Mother   . Heart attack Brother      Current Outpatient Medications:  .  acetaminophen (TYLENOL) 325 MG tablet, Take 2 tablets (650 mg total) by mouth every 4 (four) hours as needed for headache or mild pain., Disp: , Rfl:  .  albuterol (VENTOLIN HFA) 108 (90 Base) MCG/ACT inhaler, Inhale 2 puffs into the lungs every 6 (six) hours as needed for wheezing or shortness of breath., Disp: 8 g, Rfl: 2 .  aspirin 81 MG chewable tablet, Chew 1 tablet (81 mg total) by mouth daily., Disp: , Rfl:  .  atorvastatin (LIPITOR) 40 MG tablet, Take 1 tablet by mouth daily., Disp: 90 tablet, Rfl: 1 .  Benzocaine (BOIL EASE MAXIMUM STRENGTH) 20 % OINT, Apply topically., Disp: , Rfl:  .  calcium carbonate (OSCAL) 1500 (600 Ca) MG TABS tablet, Take 600 mg of elemental calcium by mouth every other day., Disp: , Rfl:  .  clopidogrel (PLAVIX) 75 MG tablet, Take 1 tablet (75 mg total)  by mouth daily., Disp: 90 tablet, Rfl: 1 .  cyclobenzaprine (FLEXERIL) 10 MG tablet, Take 1 tablet (10 mg total) by mouth 3 (three) times daily as needed for muscle spasms. (Patient not taking: Reported on 02/23/2023), Disp: 30 tablet, Rfl: 0 .  diclofenac sodium (VOLTAREN) 1 % GEL, Apply 2 g topically 4 (four) times daily., Disp: 100 g, Rfl: 3 .  MAGNESIUM OXIDE PO, Take 500 mg by mouth daily. , Disp: , Rfl:  .  Multiple Vitamins-Minerals (ONE-A-DAY MENS 50+ ADVANTAGE) TABS, Take 1 tablet by mouth daily., Disp: , Rfl:  .  olmesartan-hydrochlorothiazide (BENICAR HCT) 20-12.5 MG tablet, Take 1 tablet by mouth daily., Disp: 90 tablet, Rfl: 1 .  tiotropium (SPIRIVA HANDIHALER) 18 MCG inhalation capsule, Place 1 capsule (18 mcg total) into inhaler and inhale daily., Disp: 90 each, Rfl: 1   No Known Allergies   Review of Systems  Constitutional: Negative.   HENT: Negative.    Eyes: Negative.   Respiratory: Negative.    Cardiovascular: Negative.   Gastrointestinal: Negative.     There were no vitals filed for this visit. There is no height or weight on file to calculate BMI.  Wt Readings from Last 3 Encounters:  04/22/23 162 lb 9.6 oz (73.8 kg)  02/23/23 161 lb (73 kg)  11/18/22 164 lb (74.4 kg)    The 10-year ASCVD risk score (Arnett DK, et al., 2019) is: 19.7%   Values used to calculate the score:     Age: 76 years     Sex: Male     Is Non-Hispanic African American: Yes     Diabetic: No     Tobacco smoker: No     Systolic Blood Pressure: 134 mmHg     Is BP treated: Yes     HDL Cholesterol: 69 mg/dL     Total Cholesterol: 189 mg/dL  Objective:  Physical Exam      Assessment And Plan:  Hypertensive heart disease without congestive heart failure  Mixed hyperlipidemia  Essential hypertension    No follow-ups on file.  Patient was given opportunity to ask questions. Patient verbalized understanding of the plan and was able to repeat key elements of the plan. All questions  were answered to their satisfaction.    Jeanell Sparrow, FNP, have reviewed all documentation for this visit. The documentation on 04/23/23 for the exam, diagnosis, procedures, and orders are all accurate and complete.   IF YOU HAVE BEEN REFERRED TO A SPECIALIST, IT MAY TAKE 1-2 WEEKS TO SCHEDULE/PROCESS THE REFERRAL. IF YOU HAVE NOT HEARD FROM US/SPECIALIST IN TWO WEEKS, PLEASE GIVE [place] A CALL AT [phone] X [phone].

## 2023-04-30 ENCOUNTER — Ambulatory Visit (INDEPENDENT_AMBULATORY_CARE_PROVIDER_SITE_OTHER): Payer: Medicare PPO

## 2023-04-30 VITALS — Ht 68.0 in | Wt 164.0 lb

## 2023-04-30 DIAGNOSIS — Z0000 Encounter for general adult medical examination without abnormal findings: Secondary | ICD-10-CM | POA: Diagnosis not present

## 2023-04-30 NOTE — Progress Notes (Signed)
Subjective:   Tyrone Walton is a 72 y.o. male who presents for Medicare Annual/Subsequent preventive examination.  Visit Complete: Virtual  I connected with  [name] on 04/30/23 by a audio enabled telemedicine application and verified that I am speaking with the correct person using two identifiers.  Patient Location: Home  Provider Location: Office/Clinic  I discussed the limitations of evaluation and management by telemedicine. The patient expressed understanding and agreed to proceed.  Per patient no change in vitals since last visit, unable to obtain new vitals due to telehealth visit  .  Review of Systems     Cardiac Risk Factors include: advanced age (>73men, >12 women);dyslipidemia;hypertension;male gender     Objective:    Today's Vitals   04/30/23 1555  Weight: 164 lb (74.4 kg)  Height: 5\' 8"  (1.727 m)   Body mass index is 24.94 kg/m.     04/30/2023    4:02 PM 04/03/2022    8:16 AM 03/20/2021    8:44 AM 02/08/2020    9:29 AM 02/03/2019    8:54 AM 07/10/2017    1:41 AM 05/10/2015    6:30 AM  Advanced Directives  Does Patient Have a Medical Advance Directive? No No No No No No No  Would patient like information on creating a medical advance directive?   No - Patient declined No - Patient declined No - Patient declined  No - patient declined information    Current Medications (verified) Outpatient Encounter Medications as of 04/30/2023  Medication Sig   acetaminophen (TYLENOL) 325 MG tablet Take 2 tablets (650 mg total) by mouth every 4 (four) hours as needed for headache or mild pain.   albuterol (VENTOLIN HFA) 108 (90 Base) MCG/ACT inhaler Inhale 2 puffs into the lungs every 6 (six) hours as needed for wheezing or shortness of breath.   aspirin 81 MG chewable tablet Chew 1 tablet (81 mg total) by mouth daily.   atorvastatin (LIPITOR) 40 MG tablet Take 1 tablet by mouth daily.   Benzocaine (BOIL EASE MAXIMUM STRENGTH) 20 % OINT Apply topically.    calcium carbonate (OSCAL) 1500 (600 Ca) MG TABS tablet Take 600 mg of elemental calcium by mouth every other day.   clopidogrel (PLAVIX) 75 MG tablet Take 1 tablet (75 mg total) by mouth daily.   MAGNESIUM OXIDE PO Take 500 mg by mouth daily.    Multiple Vitamins-Minerals (ONE-A-DAY MENS 50+ ADVANTAGE) TABS Take 1 tablet by mouth daily.   olmesartan-hydrochlorothiazide (BENICAR HCT) 20-12.5 MG tablet Take 1 tablet by mouth daily.   tiotropium (SPIRIVA HANDIHALER) 18 MCG inhalation capsule Place 1 capsule (18 mcg total) into inhaler and inhale daily.   cyclobenzaprine (FLEXERIL) 10 MG tablet Take 1 tablet (10 mg total) by mouth 3 (three) times daily as needed for muscle spasms. (Patient not taking: Reported on 02/23/2023)   diclofenac sodium (VOLTAREN) 1 % GEL Apply 2 g topically 4 (four) times daily. (Patient not taking: Reported on 04/30/2023)   No facility-administered encounter medications on file as of 04/30/2023.    Allergies (verified) Patient has no known allergies.   History: Past Medical History:  Diagnosis Date   CAD (coronary artery disease)    Chronic left shoulder pain 02/10/2019   Constipation    ED (erectile dysfunction)    Elevated vitamin B12 level 11/24/2022   Will recheck B12 low goals were elevated at last visit.   Hyperlipidemia    Hypertension    Neuromuscular disorder (HCC)    Pain in  limb    Peripheral vascular disease (HCC)    claudication   Tobacco abuse 03/13/2015   Tobacco abuse   Past Surgical History:  Procedure Laterality Date   BACK SURGERY     LUMBAR DISC SURGERY  ~ 2013   PERIPHERAL VASCULAR CATHETERIZATION N/A 04/12/2015   Procedure: Lower Extremity Angiography;  Surgeon: Runell Gess, MD;  Location: [facility] INVASIVE CV LAB;  Service: Cardiovascular;  Laterality: N/A;   PERIPHERAL VASCULAR CATHETERIZATION Right 05/10/2015   Procedure: Peripheral Vascular Intervention;  Surgeon: Runell Gess, MD;  Location: [facility] INVASIVE CV LAB;  Service:  Cardiovascular;  Laterality: Right;  rt ext iliac stent   Family History  Problem Relation Age of Onset   Dementia Mother    Heart failure Mother    Heart attack Brother    Social History   Socioeconomic History   Marital status: Married    Spouse name: Not on file   Number of children: Not on file   Years of education: Not on file   Highest education level: Not on file  Occupational History   Occupation: retired   Occupation: part time employed  Tobacco Use   Smoking status: Former    Current packs/day: 0.00    Average packs/day: 0.5 packs/day for 49.0 years (24.5 ttl pk-yrs)    Types: Cigarettes    Start date: 04/12/1966    Quit date: 04/13/2015    Years since quitting: 8.0   Smokeless tobacco: Never   Tobacco comments:    Currently use Vapor, Quit vaping since December 01, 2020  Vaping Use   Vaping status: Former   Substances: Nicotine, Flavoring  Substance and Sexual Activity   Alcohol use: Yes    Alcohol/week: 6.0 standard drinks of alcohol    Types: 6 Cans of beer per week    Comment: 05/10/2015 "I only drink on the weekends; down from 12 to 6 cans of beer"   Drug use: No   Sexual activity: Yes  Other Topics Concern   Not on file  Social History Narrative   Not on file   Social Determinants of Health   Financial Resource Strain: Low Risk  (04/30/2023)   Overall Financial Resource Strain (CARDIA)    Difficulty of Paying Living Expenses: Not hard at all  Food Insecurity: No Food Insecurity (04/30/2023)   Hunger Vital Sign    Worried About Running Out of Food in the Last Year: Never true    Ran Out of Food in the Last Year: Never true  Transportation Needs: No Transportation Needs (04/30/2023)   PRAPARE - Administrator, Civil Service (Medical): No    Lack of Transportation (Non-Medical): No  Physical Activity: Inactive (04/30/2023)   Exercise Vital Sign    Days of Exercise per Week: 0 days    Minutes of Exercise per Session: 110 min  Stress: No  Stress Concern Present (04/30/2023)   [employer] of Occupational Health - Occupational Stress Questionnaire    Feeling of Stress : Not at all  Social Connections: Moderately Isolated (04/30/2023)   Social Connection and Isolation Panel [NHANES]    Frequency of Communication with Friends and Family: More than three times a week    Frequency of Social Gatherings with Friends and Family: Twice a week    Attends Religious Services: Never    Database administrator or Organizations: No    Attends Banker Meetings: Never    Marital Status: Married    Tobacco Counseling  Counseling given: Not Answered Tobacco comments: Currently use Vapor, Quit vaping since December 01, 2020   Clinical Intake:  Pre-visit preparation completed: Yes  Pain : No/denies pain     Nutritional Status: BMI of 19-24  Normal Nutritional Risks: None Diabetes: No  How often do you need to have someone help you when you read instructions, pamphlets, or other written materials from your doctor or pharmacy?: 1 - Never  Interpreter Needed?: No  Information entered by :: NAllen LPN   Activities of Daily Living    04/30/2023    3:57 PM  In your present state of health, do you have any difficulty performing the following activities:  Hearing? 0  Vision? 0  Difficulty concentrating or making decisions? 0  Walking or climbing stairs? 0  Dressing or bathing? 0  Doing errands, shopping? 0  Preparing Food and eating ? N  Using the Toilet? N  In the past six months, have you accidently leaked urine? N  Do you have problems with loss of bowel control? N  Managing your Medications? N  Managing your Finances? N  Housekeeping or managing your Housekeeping? N    Patient Care Team: Arnette Felts, FNP as PCP - General (General Practice) Runell Gess, MD as PCP - Cardiology (Cardiology)  Indicate any recent Medical Services you may have received from other than [facility] providers in the past year  (date may be approximate).     Assessment:   This is a routine wellness examination for Werner.  Hearing/Vision screen Hearing Screening - Comments:: Denies hearing issues Vision Screening - Comments:: Regular eye exams, [facility]  Dietary issues and exercise activities discussed:     Goals Addressed             This Visit's Progress    Patient Stated       04/30/2023 stay healthy       Depression Screen    04/30/2023    4:03 PM 11/18/2022    9:12 AM 04/03/2022    8:17 AM 03/20/2021    8:45 AM 02/13/2021    8:47 AM 02/08/2020    9:31 AM 02/08/2020    8:50 AM  PHQ 2/9 Scores  PHQ - 2 Score 0 0 0 0 0 0 0  PHQ- 9 Score 0 1    0     Fall Risk    04/30/2023    4:02 PM 02/23/2023    9:04 AM 07/14/2022    8:30 AM 04/03/2022    8:17 AM 03/20/2021    8:45 AM  Fall Risk   Falls in the past year? 0 0 0 0 0  Number falls in past yr: 0 0 0 0   Injury with Fall? 0 0 0 0   Risk for fall due to : Medication side effect No Fall Risks No Fall Risks Medication side effect Medication side effect  Follow up Falls prevention discussed;Falls evaluation completed Falls evaluation completed Falls evaluation completed Falls evaluation completed;Education provided;Falls prevention discussed Falls evaluation completed;Education provided;Falls prevention discussed    MEDICARE RISK AT HOME:  Medicare Risk at Home - 04/30/23 1603     Any stairs in or around the home? Yes    If so, are there any without handrails? Yes    Home free of loose throw rugs in walkways, pet beds, electrical cords, etc? Yes    Adequate lighting in your home to reduce risk of falls? Yes    Life alert? No    Use of  a cane, walker or w/c? No    Grab bars in the bathroom? No    Shower chair or bench in shower? No    Elevated toilet seat or a handicapped toilet? No             TIMED UP AND GO:  Was the test performed?  No    Cognitive Function:        04/30/2023    4:04 PM 04/03/2022    8:18 AM 03/20/2021    8:46 AM  02/08/2020    9:32 AM 02/03/2019    8:57 AM  6CIT Screen  What Year? 0 points 0 points 0 points 0 points 0 points  What month? 0 points 0 points 0 points 0 points 0 points  What time? 0 points 0 points 3 points 0 points 0 points  Count back from 20 0 points 0 points 0 points 0 points 0 points  Months in reverse 4 points 4 points 4 points 4 points 4 points  Repeat phrase 0 points 8 points 4 points 4 points 2 points  Total Score 4 points 12 points 11 points 8 points 6 points    Immunizations Immunization History  Administered Date(s) Administered   COVID-19, mRNA, vaccine(Comirnaty)12 years and older 08/27/2022   Fluad Quad(high Dose 65+) 07/04/2020, 06/27/2021, 07/14/2022   Influenza, High Dose Seasonal PF 07/14/2018, 06/11/2019   Influenza-Unspecified 07/13/2018, 06/11/2019   PFIZER(Purple Top)SARS-COV-2 Vaccination 12/10/2019, 01/07/2020, 02/15/2021   Pfizer Covid-19 Vaccine Bivalent Booster 54yrs & up 06/27/2021   Pneumococcal Conjugate-13 05/29/2015   Pneumococcal Polysaccharide-23 05/28/2016   Tdap 03/13/2016   Zoster Recombinant(Shingrix) 01/29/2017, 04/14/2017    TDAP status: Up to date  Flu Vaccine status: Up to date  Pneumococcal vaccine status: Up to date  Covid-19 vaccine status: Completed vaccines  Qualifies for Shingles Vaccine? Yes   Zostavax completed Yes   Shingrix Completed?: Yes  Screening Tests Health Maintenance  Topic Date Due   Colonoscopy  02/14/2021   COVID-19 Vaccine (6 - 2023-24 season) 06/12/2023 (Originally 10/22/2022)   INFLUENZA VACCINE  05/14/2023   Lung Cancer Screening  12/10/2023   Medicare Annual Wellness (AWV)  04/29/2024   DTaP/Tdap/Td (2 - Td or Tdap) 03/13/2026   Pneumonia Vaccine 4+ Years old  Completed   Hepatitis C Screening  Completed   Zoster Vaccines- Shingrix  Completed   HPV VACCINES  Aged Out    Health Maintenance  Health Maintenance Due  Topic Date Due   Colonoscopy  02/14/2021    Colorectal cancer screening:  scheduled for 05/15/2023  Lung Cancer Screening: (Low Dose CT Chest recommended if Age 52-80 years, 20 pack-year currently smoking OR have quit w/in 15years.) does qualify.   Lung Cancer Screening Referral: CT scan 12/09/2022  Additional Screening:  Hepatitis C Screening: does qualify; Completed 02/08/2020  Vision Screening: Recommended annual ophthalmology exams for early detection of glaucoma and other disorders of the eye. Is the patient up to date with their annual eye exam?  Yes  Who is the provider or what is the name of the office in which the patient attends annual eye exams? [facility] If pt is not established with a provider, would they like to be referred to a provider to establish care? No .   Dental Screening: Recommended annual dental exams for proper oral hygiene  Diabetic Foot Exam: n/a  Community Resource Referral / Chronic Care Management: CRR required this visit?  No   CCM required this visit?  No     Plan:  I have personally reviewed and noted the following in the patient's chart:   Medical and social history Use of alcohol, tobacco or illicit drugs  Current medications and supplements including opioid prescriptions. Patient is not currently taking opioid prescriptions. Functional ability and status Nutritional status Physical activity Advanced directives List of other physicians Hospitalizations, surgeries, and ER visits in previous 12 months Vitals Screenings to include cognitive, depression, and falls Referrals and appointments  In addition, I have reviewed and discussed with patient certain preventive protocols, quality metrics, and best practice recommendations. A written personalized care plan for preventive services as well as general preventive health recommendations were provided to patient.     Barb Merino, LPN   1/61/0960   After Visit Summary: (Pick Up) Due to this being a telephonic visit, with patients personalized plan was offered  to patient and patient has requested to Pick up at office.  Nurse Notes: none

## 2023-04-30 NOTE — Patient Instructions (Signed)
Tyrone Walton , Thank you for taking time to come for your Medicare Wellness Visit. I appreciate your ongoing commitment to your health goals. Please review the following plan we discussed and let me know if I can assist you in the future.   These are the goals we discussed:  Goals       Patient Stated (pt-stated)      Stay healthy      Patient Stated      02/08/2020, wants to stay healthy      Patient Stated      03/20/2021, stay healthy      Patient Stated      04/03/2022, stay healthy      Patient Stated      04/30/2023 stay healthy        This is a list of the screening recommended for you and due dates:  Health Maintenance  Topic Date Due   Colon Cancer Screening  02/14/2021   COVID-19 Vaccine (6 - 2023-24 season) 06/12/2023*   Flu Shot  05/14/2023   Screening for Lung Cancer  12/10/2023   Medicare Annual Wellness Visit  04/29/2024   DTaP/Tdap/Td vaccine (2 - Td or Tdap) 03/13/2026   Pneumonia Vaccine  Completed   Hepatitis C Screening  Completed   Zoster (Shingles) Vaccine  Completed   HPV Vaccine  Aged Out  *Topic was postponed. The date shown is not the original due date.    Advanced directives: Advance directive discussed with you today.   Conditions/risks identified: none  Next appointment: Follow up in one year for your annual wellness visit.   Preventive Care 48 Years and Older, Male  Preventive care refers to lifestyle choices and visits with your health care provider that can promote health and wellness. What does preventive care include? A yearly physical exam. This is also called an annual well check. Dental exams once or twice a year. Routine eye exams. Ask your health care provider how often you should have your eyes checked. Personal lifestyle choices, including: Daily care of your teeth and gums. Regular physical activity. Eating a healthy diet. Avoiding tobacco and drug use. Limiting alcohol use. Practicing safe sex. Taking low doses of  aspirin every day. Taking vitamin and mineral supplements as recommended by your health care provider. What happens during an annual well check? The services and screenings done by your health care provider during your annual well check will depend on your age, overall health, lifestyle risk factors, and family history of disease. Counseling  Your health care provider may ask you questions about your: Alcohol use. Tobacco use. Drug use. Emotional well-being. Home and relationship well-being. Sexual activity. Eating habits. History of falls. Memory and ability to understand (cognition). Work and work Astronomer. Screening  You may have the following tests or measurements: Height, weight, and BMI. Blood pressure. Lipid and cholesterol levels. These may be checked every 5 years, or more frequently if you are over 39 years old. Skin check. Lung cancer screening. You may have this screening every year starting at age 59 if you have a 30-pack-year history of smoking and currently smoke or have quit within the past 15 years. Fecal occult blood test (FOBT) of the stool. You may have this test every year starting at age 51. Flexible sigmoidoscopy or colonoscopy. You may have a sigmoidoscopy every 5 years or a colonoscopy every 10 years starting at age 33. Prostate cancer screening. Recommendations will vary depending on your family history and other risks. Hepatitis C  blood test. Hepatitis B blood test. Sexually transmitted disease (STD) testing. Diabetes screening. This is done by checking your blood sugar (glucose) after you have not eaten for a while (fasting). You may have this done every 1-3 years. Abdominal aortic aneurysm (AAA) screening. You may need this if you are a current or former smoker. Osteoporosis. You may be screened starting at age 69 if you are at high risk. Talk with your health care provider about your test results, treatment options, and if necessary, the need for more  tests. Vaccines  Your health care provider may recommend certain vaccines, such as: Influenza vaccine. This is recommended every year. Tetanus, diphtheria, and acellular pertussis (Tdap, Td) vaccine. You may need a Td booster every 10 years. Zoster vaccine. You may need this after age 77. Pneumococcal 13-valent conjugate (PCV13) vaccine. One dose is recommended after age 49. Pneumococcal polysaccharide (PPSV23) vaccine. One dose is recommended after age 32. Talk to your health care provider about which screenings and vaccines you need and how often you need them. This information is not intended to replace advice given to you by your health care provider. Make sure you discuss any questions you have with your health care provider. Document Released: 10/26/2015 Document Revised: 06/18/2016 Document Reviewed: 07/31/2015 Elsevier Interactive Patient Education  2017 [employer].  Fall Prevention in the Home Falls can cause injuries. They can happen to people of all ages. There are many things you can do to make your home safe and to help prevent falls. What can I do on the outside of my home? Regularly fix the edges of walkways and driveways and fix any cracks. Remove anything that might make you trip as you walk through a door, such as a raised step or threshold. Trim any bushes or trees on the path to your home. Use bright outdoor lighting. Clear any walking paths of anything that might make someone trip, such as rocks or tools. Regularly check to see if handrails are loose or broken. Make sure that both sides of any steps have handrails. Any raised decks and porches should have guardrails on the edges. Have any leaves, snow, or ice cleared regularly. Use sand or salt on walking paths during winter. Clean up any spills in your garage right away. This includes oil or grease spills. What can I do in the bathroom? Use night lights. Install grab bars by the toilet and in the tub and shower.  Do not use towel bars as grab bars. Use non-skid mats or decals in the tub or shower. If you need to sit down in the shower, use a plastic, non-slip stool. Keep the floor dry. Clean up any water that spills on the floor as soon as it happens. Remove soap buildup in the tub or shower regularly. Attach bath mats securely with double-sided non-slip rug tape. Do not have throw rugs and other things on the floor that can make you trip. What can I do in the bedroom? Use night lights. Make sure that you have a light by your bed that is easy to reach. Do not use any sheets or blankets that are too big for your bed. They should not hang down onto the floor. Have a firm chair that has side arms. You can use this for support while you get dressed. Do not have throw rugs and other things on the floor that can make you trip. What can I do in the kitchen? Clean up any spills right away. Avoid walking on  wet floors. Keep items that you use a lot in easy-to-reach places. If you need to reach something above you, use a strong step stool that has a grab bar. Keep electrical cords out of the way. Do not use floor polish or wax that makes floors slippery. If you must use wax, use non-skid floor wax. Do not have throw rugs and other things on the floor that can make you trip. What can I do with my stairs? Do not leave any items on the stairs. Make sure that there are handrails on both sides of the stairs and use them. Fix handrails that are broken or loose. Make sure that handrails are as long as the stairways. Check any carpeting to make sure that it is firmly attached to the stairs. Fix any carpet that is loose or worn. Avoid having throw rugs at the top or bottom of the stairs. If you do have throw rugs, attach them to the floor with carpet tape. Make sure that you have a light switch at the top of the stairs and the bottom of the stairs. If you do not have them, ask someone to add them for you. What else  can I do to help prevent falls? Wear shoes that: Do not have high heels. Have rubber bottoms. Are comfortable and fit you well. Are closed at the toe. Do not wear sandals. If you use a stepladder: Make sure that it is fully opened. Do not climb a closed stepladder. Make sure that both sides of the stepladder are locked into place. Ask someone to hold it for you, if possible. Clearly mark and make sure that you can see: Any grab bars or handrails. First and last steps. Where the edge of each step is. Use tools that help you move around (mobility aids) if they are needed. These include: Canes. Walkers. Scooters. Crutches. Turn on the lights when you go into a dark area. Replace any light bulbs as soon as they burn out. Set up your furniture so you have a clear path. Avoid moving your furniture around. If any of your floors are uneven, fix them. If there are any pets around you, be aware of where they are. Review your medicines with your doctor. Some medicines can make you feel dizzy. This can increase your chance of falling. Ask your doctor what other things that you can do to help prevent falls. This information is not intended to replace advice given to you by your health care provider. Make sure you discuss any questions you have with your health care provider. Document Released: 07/26/2009 Document Revised: 03/06/2016 Document Reviewed: 11/03/2014 Elsevier Interactive Patient Education  2017 [employer].

## 2023-05-08 ENCOUNTER — Ambulatory Visit (HOSPITAL_COMMUNITY)
Admission: RE | Admit: 2023-05-08 | Discharge: 2023-05-08 | Disposition: A | Discharge status: Home / Self Care | Payer: Medicare PPO | Source: Ambulatory Visit | Attending: General Practice | Admitting: General Practice

## 2023-05-08 ENCOUNTER — Ambulatory Visit (HOSPITAL_BASED_OUTPATIENT_CLINIC_OR_DEPARTMENT_OTHER)
Admission: RE | Admit: 2023-05-08 | Discharge: 2023-05-08 | Disposition: A | Discharge status: Home / Self Care | Payer: Medicare PPO | Source: Ambulatory Visit | Attending: General Practice | Admitting: General Practice

## 2023-05-08 DIAGNOSIS — Z95820 Peripheral vascular angioplasty status with implants and grafts: Secondary | ICD-10-CM

## 2023-05-08 DIAGNOSIS — I739 Peripheral vascular disease, unspecified: Secondary | ICD-10-CM

## 2023-05-08 LAB — VAS US ABI WITH/WO TBI
Left ABI: 0.37
Right ABI: 0.52

## 2023-05-15 DIAGNOSIS — D122 Benign neoplasm of ascending colon: Secondary | ICD-10-CM | POA: Diagnosis not present

## 2023-05-15 DIAGNOSIS — Z1211 Encounter for screening for malignant neoplasm of colon: Secondary | ICD-10-CM | POA: Diagnosis not present

## 2023-05-15 DIAGNOSIS — K5730 Diverticulosis of large intestine without perforation or abscess without bleeding: Secondary | ICD-10-CM | POA: Diagnosis not present

## 2023-05-15 DIAGNOSIS — Z86010 Personal history of colonic polyps: Secondary | ICD-10-CM | POA: Diagnosis not present

## 2023-05-15 DIAGNOSIS — K6389 Other specified diseases of intestine: Secondary | ICD-10-CM | POA: Diagnosis not present

## 2023-05-15 DIAGNOSIS — K5520 Angiodysplasia of colon without hemorrhage: Secondary | ICD-10-CM | POA: Diagnosis not present

## 2023-05-15 DIAGNOSIS — K635 Polyp of colon: Secondary | ICD-10-CM | POA: Diagnosis not present

## 2023-05-15 DIAGNOSIS — D125 Benign neoplasm of sigmoid colon: Secondary | ICD-10-CM | POA: Diagnosis not present

## 2023-05-15 DIAGNOSIS — K6289 Other specified diseases of anus and rectum: Secondary | ICD-10-CM | POA: Diagnosis not present

## 2023-05-15 LAB — HM COLONOSCOPY

## 2023-05-27 ENCOUNTER — Other Ambulatory Visit: Payer: Self-pay

## 2023-05-27 MED ORDER — OLMESARTAN MEDOXOMIL-HCTZ 20-12.5 MG PO TABS: 1.0000 | ORAL_TABLET | Freq: Every day | ORAL | 1 refills | Status: DC

## 2023-06-05 ENCOUNTER — Other Ambulatory Visit: Payer: Self-pay | Admitting: Nurse Practitioner

## 2023-06-08 ENCOUNTER — Telehealth: Payer: Self-pay | Admitting: Cardiovascular Disease

## 2023-06-08 NOTE — Telephone Encounter (Signed)
Call to patient. He is to follow up with Dr Allyson Sabal regarding [place] with Dr Allyson Sabal but OK to see APP? Once confirmed will call patient to schedule

## 2023-06-08 NOTE — Telephone Encounter (Signed)
Patient is requesting a phone appt with Dr. Allyson Sabal and would like a call back to discuss further.

## 2023-06-09 NOTE — Telephone Encounter (Signed)
Spoke with patient and set appt. For next wednesday

## 2023-06-17 ENCOUNTER — Ambulatory Visit: Payer: Medicare PPO | Attending: Physician Assistant | Admitting: Physician Assistant

## 2023-06-17 ENCOUNTER — Encounter: Payer: Self-pay | Admitting: Physician Assistant

## 2023-06-17 VITALS — BP 128/70 | HR 74 | Ht 68.0 in | Wt 159.2 lb

## 2023-06-17 DIAGNOSIS — E785 Hyperlipidemia, unspecified: Secondary | ICD-10-CM | POA: Diagnosis not present

## 2023-06-17 DIAGNOSIS — Z79899 Other long term (current) drug therapy: Secondary | ICD-10-CM | POA: Diagnosis not present

## 2023-06-17 DIAGNOSIS — I739 Peripheral vascular disease, unspecified: Secondary | ICD-10-CM

## 2023-06-17 DIAGNOSIS — I10 Essential (primary) hypertension: Secondary | ICD-10-CM

## 2023-06-17 MED ORDER — ATORVASTATIN CALCIUM 80 MG PO TABS
80.0000 mg | ORAL_TABLET | Freq: Every day | ORAL | 3 refills | Status: DC
Start: 2023-06-17 — End: 2024-07-08

## 2023-06-17 NOTE — Patient Instructions (Signed)
Medication Instructions:  INCREASE ATORVASTATIN TO 80 MG DAILY. *If you need a refill on your cardiac medications before your next appointment, please call your pharmacy*   Lab Work: BMET AND CBC TODAY. If you have labs (blood work) drawn today and your tests are completely normal, you will receive your results only by: MyChart Message (if you have MyChart) OR A paper copy in the mail If you have any lab test that is abnormal or we need to change your treatment, we will call you to review the results.  Your physician recommends that you return for lab work in: 3 MONTHS FOR FASTING LIPIDS AND LIVER FUNCTION PANEL.    Testing/Procedures: Your physician has requested that you have an Aorta/Iliac Duplex. This will be take place at [address] [facility], [address].  No food after 11PM the night before.  Water is OK. (Don't drink liquids if you have been instructed not to for ANOTHER test) Avoid foods that produce bowel gas, for 24 hours prior to exam (see below). No breakfast, no chewing gum, no smoking or carbonated beverages. Patient may take morning medications with water. Come in for test at least 15 minutes early to register.  To do 1-2 weeks after procedure( 06/29/23)  Your physician has requested that you have a lower extremity arterial duplex. During this test, ultrasound is used to evaluate arterial blood flow in the legs. Allow one hour for this exam. There are no restrictions or special instructions. This will take place at [address] [facility], [address]. To do 1-2 weeks after procedure( 06/29/23)  Your physician has requested that you have an ankle brachial index (ABI). During this test an ultrasound and blood pressure cuff are used to evaluate the arteries that supply the arms and legs with blood. Allow thirty minutes for this exam. There are no restrictions or special instructions. This will take place at [address] [facility], [address]. To do 1-2 weeks after procedure( 06/29/23)      Follow-Up: At [facility], you and your health needs are our priority.  As part of our continuing mission to provide you with exceptional heart care, we have created designated Provider Care Teams.  These Care Teams include your primary Cardiologist (physician) and Advanced Practice Providers (APPs -  Physician Assistants and Nurse Practitioners) who all work together to provide you with the care you need, when you need it.  We recommend signing up for the patient portal called "MyChart".  Sign up information is provided on this After Visit Summary.  MyChart is used to connect with patients for Virtual Visits (Telemedicine).  Patients are able to view lab/test results, encounter notes, upcoming appointments, etc.  Non-urgent messages can be sent to your provider as well.   To learn more about what you can do with MyChart, go to [email or web address].    Your next appointment:   2-3 week(s) AFTER PROCEDURE  Provider:   [name], MD  Other Instructions       Cardiac/Peripheral Catheterization   You are scheduled for a Peripheral Angiogram on Monday, September 16 with Dr. [name].  1. Please arrive at the [facility] (Main Entrance A) at [facility]: [address] [place], [place] [address] at 10:00 AM (This time is 2 hour(s) before your procedure to ensure your preparation). Free valet parking service is available. You will check in at ADMITTING. The support person will be asked to wait in the waiting room.  It is OK to have someone drop  you off and come back when you are ready to be discharged.        Special note: Every effort is made to have your procedure done on time. Please understand that emergencies sometimes delay scheduled procedures.  2. Diet: Do not eat solid foods after midnight.  You may have clear liquids until 5 AM the day of the procedure.  3. Labs: You will need to have blood drawn on 06/17/23 4. Medication instructions in preparation  for your procedure:   Stop taking, benicar Monday, September 16,   On the morning of your procedure, take Aspirin 81 mg and Plavix/Clopidogrel and any morning medicines NOT listed above.  You may use sips of water.  5. Plan to go home the same day, you will only stay overnight if medically necessary. 6. You MUST have a responsible adult to drive you home. 7. An adult MUST be with you the first 24 hours after you arrive home. 8. Bring a current list of your medications, and the last time and date medication taken. 9. Bring ID and current insurance cards. 10.Please wear clothes that are easy to get on and off and wear slip-on shoes.  Thank you for allowing [place] to care for you!   -- [facility]

## 2023-06-17 NOTE — H&P (View-Only) (Signed)
Cardiology Office Note:  .   Date:  06/17/2023  ID:  Tyrone Walton, DOB 11-20-1950, MRN [id number] PCP: Tyrone Felts, FNP  [facility] Providers Cardiologist:  Tyrone Batty, MD     History of Present Illness: .   Tyrone Walton is a 72 y.o. male with PMH of PAD, HTN, HLD and aortic atherosclerosis.  Patient previously underwent angiography by Tyrone Walton on 04/12/2015 with stenting of the left external iliac artery, he also noted to have long segment of occluded calcified left SFA that was unable to be crossed.  His claudication symptom improved.  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  After the procedure, he had significant improvement of the right ABI as well.  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  Repeat ABI and lower extremity arterial Doppler obtained on 05/09/2023 showed right ABI 0.52, left ABI 0.37.  The left ABI has significantly reduced when compared to the previous ABI.  Talking with the patient today, he complains of worsening left lower extremity claudication symptoms.  He actually has bilateral claudication symptom, however left side is worse than the right side.  He says walking from the parking lot to the school will cause bilateral calf pain.  He is still on aspirin and Plavix.  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  ROS:   Patient complains of increasing lower extremity claudication symptom, worse on the left side compared to the right side.  He denies any chest pain or shortness of breath  Studies Reviewed: .            Risk  Assessment/Calculations:             Physical Exam:   VS:  BP 128/70 (BP Location: Left Arm, Patient Position: Sitting, Cuff Size: Normal)   Pulse 74   Ht 5\' 8"  (1.727 m)   Wt 159 lb 3.2 oz (72.2 kg)   SpO2 96%   BMI 24.21 kg/m    Wt Readings from Last 3 Encounters:  06/17/23 159 lb 3.2 oz (72.2 kg)  04/30/23 164 lb (74.4 kg)  04/22/23 162 lb 9.6 oz (73.8 kg)    GEN: Well nourished, well developed in no acute distress NECK: No JVD; No carotid bruits CARDIAC: RRR, no murmurs, rubs, gallops RESPIRATORY:  Clear to auscultation without rales, wheezing or rhonchi  ABDOMEN: Soft, non-tender, non-distended EXTREMITIES:  No edema; No deformity   ASSESSMENT AND PLAN: .    Peripheral arterial disease with lifestyle limiting claudication: Patient has bilateral calf pain with exertion, worse on the left side compared to the right side.  Recent ABI showed significant drop in the left ABI when compared to the previous study.  Case was discussed with Tyrone Walton, we will proceed with lower extremity angiography next week.  Hypertension: Blood pressure stable  Hyperlipidemia: Increase Lipitor to 80 mg daily.    Informed Consent   Shared Decision Making/Informed Consent The risks [stroke (1 in 1000), death (1 in 1000), kidney failure [usually temporary] (1 in 500), bleeding (1 in 200), allergic reaction [possibly serious] (1 in  200)], benefits (diagnostic support and management of coronary artery disease) and alternatives of a lower extremity angiography were discussed in detail with Tyrone Walton and he is willing to proceed.     Dispo: Follow-up with Tyrone Walton after lower extremity angiography procedure next week.  Signed, Azalee Course, PA

## 2023-06-17 NOTE — Progress Notes (Signed)
Cardiology Office Note:  .   Date:  06/17/2023  ID:  Tyrone Walton, DOB 1950-11-22, MRN [id number] PCP: Arnette Felts, FNP  [facility] Providers Cardiologist:  [name], MD     History of Present Illness: .   Tyrone Walton is a 72 y.o. male with PMH of PAD, HTN, HLD and aortic atherosclerosis.  Patient previously underwent angiography by Dr. Allyson Sabal on 04/12/2015 with stenting of the left external iliac artery, he also noted to have long segment of occluded calcified left SFA that was unable to be crossed.  His claudication symptom improved.  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  After the procedure, he had significant improvement of the right ABI as well.  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  Repeat ABI and lower extremity arterial Doppler obtained on 05/09/2023 showed right ABI 0.52, left ABI 0.37.  The left ABI has significantly reduced when compared to the previous ABI.  Talking with the patient today, he complains of worsening left lower extremity claudication symptoms.  He actually has bilateral claudication symptom, however left side is worse than the right side.  He says walking from the parking lot to the school will cause bilateral calf pain.  He is still on aspirin and Plavix.  I reviewed his case with Dr. 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  ROS:   Patient complains of increasing lower extremity claudication symptom, worse on the left side compared to the right side.  He denies any chest pain or shortness of breath  Studies Reviewed: .            Risk  Assessment/Calculations:             Physical Exam:   VS:  BP 128/70 (BP Location: Left Arm, Patient Position: Sitting, Cuff Size: Normal)   Pulse 74   Ht 5\' 8"  (1.727 m)   Wt 159 lb 3.2 oz (72.2 kg)   SpO2 96%   BMI 24.21 kg/m    Wt Readings from Last 3 Encounters:  06/17/23 159 lb 3.2 oz (72.2 kg)  04/30/23 164 lb (74.4 kg)  04/22/23 162 lb 9.6 oz (73.8 kg)    GEN: Well nourished, well developed in no acute distress NECK: No JVD; No carotid bruits CARDIAC: RRR, no murmurs, rubs, gallops RESPIRATORY:  Clear to auscultation without rales, wheezing or rhonchi  ABDOMEN: Soft, non-tender, non-distended EXTREMITIES:  No edema; No deformity   ASSESSMENT AND PLAN: .    Peripheral arterial disease with lifestyle limiting claudication: Patient has bilateral calf pain with exertion, worse on the left side compared to the right side.  Recent ABI showed significant drop in the left ABI when compared to the previous study.  Case was discussed with Dr. Allyson Sabal, we will proceed with lower extremity angiography next week.  Hypertension: Blood pressure stable  Hyperlipidemia: Increase Lipitor to 80 mg daily.    Informed Consent   Shared Decision Making/Informed Consent The risks [stroke (1 in 1000), death (1 in 1000), kidney failure [usually temporary] (1 in 500), bleeding (1 in 200), allergic reaction [possibly serious] (1 in  200)], benefits (diagnostic support and management of coronary artery disease) and alternatives of a lower extremity angiography were discussed in detail with [name] and he is willing to proceed.     Dispo: Follow-up with Dr. Allyson Sabal after lower extremity angiography procedure next week.  Signed, Azalee Course, PA

## 2023-06-18 ENCOUNTER — Telehealth: Payer: Self-pay

## 2023-06-18 LAB — CBC
Hematocrit: 45.1 % (ref 37.5–51.0)
Hemoglobin: 15.1 g/dL (ref 13.0–17.7)
MCH: 29.6 pg (ref 26.6–33.0)
MCHC: 33.5 g/dL (ref 31.5–35.7)
MCV: 88 fL (ref 79–97)
Platelets: 290 10*3/uL (ref 150–450)
RBC: 5.1 x10E6/uL (ref 4.14–5.80)
RDW: 13.2 % (ref 11.6–15.4)
WBC: 6.9 10*3/uL (ref 3.4–10.8)

## 2023-06-18 LAB — BASIC METABOLIC PANEL
BUN/Creatinine Ratio: 11 (ref 10–24)
BUN: 11 mg/dL (ref 8–27)
CO2: 25 mmol/L (ref 20–29)
Calcium: 10.1 mg/dL (ref 8.6–10.2)
Chloride: 100 mmol/L (ref 96–106)
Creatinine, Ser: 0.99 mg/dL (ref 0.76–1.27)
Glucose: 94 mg/dL (ref 70–99)
Potassium: 4.4 mmol/L (ref 3.5–5.2)
Sodium: 138 mmol/L (ref 134–144)
eGFR: 81 mL/min/{1.73_m2} (ref >59)

## 2023-06-18 NOTE — Telephone Encounter (Addendum)
Called patient regarding results, patient had understanding of results----- Message from [facility] sent at 06/18/2023 11:43 AM EDT ----- Stable renal function and electrolyte. Normal red blood cell count.

## 2023-06-19 ENCOUNTER — Other Ambulatory Visit: Payer: Self-pay | Admitting: Physician Assistant

## 2023-06-25 ENCOUNTER — Telehealth: Payer: Self-pay | Admitting: *Deleted

## 2023-06-25 NOTE — Telephone Encounter (Signed)
Abdominal Aortogram scheduled at [facility] for: Monday June 29, 2023 12 Noon Arrival time [facility] Main Entrance A at: 10 AM  Nothing to eat after midnight prior to procedure, clear liquids until 5 AM day of procedure.  Medication instructions: -Hold:  Olmesartan/HCT- AM of procedure -Other usual morning medications can be taken with sips of water including aspirin 81 mg and Plavix 75 mg.  Plan to go home the same day, you will only stay overnight if medically necessary.  You must have responsible adult to drive you home.  Someone must be with you the first 24 hours after you arrive home.  Reviewed procedure instructions with patient.

## 2023-06-29 ENCOUNTER — Encounter (HOSPITAL_COMMUNITY): Payer: Self-pay | Admitting: Cardiovascular Disease

## 2023-06-29 ENCOUNTER — Encounter (HOSPITAL_COMMUNITY)
Admission: RE | Disposition: A | Discharge status: Home / Self Care | Payer: Self-pay | Source: Ambulatory Visit | Attending: Cardiovascular Disease

## 2023-06-29 ENCOUNTER — Other Ambulatory Visit: Payer: Self-pay

## 2023-06-29 ENCOUNTER — Ambulatory Visit (HOSPITAL_COMMUNITY)
Admission: RE | Admit: 2023-06-29 | Discharge: 2023-06-30 | Disposition: A | Discharge status: Home / Self Care | Payer: Medicare PPO | Source: Ambulatory Visit | Attending: Cardiovascular Disease | Admitting: Cardiovascular Disease

## 2023-06-29 DIAGNOSIS — I700 Atherosclerosis of aorta: Secondary | ICD-10-CM | POA: Diagnosis not present

## 2023-06-29 DIAGNOSIS — M79662 Pain in left lower leg: Secondary | ICD-10-CM | POA: Diagnosis not present

## 2023-06-29 DIAGNOSIS — I701 Atherosclerosis of renal artery: Secondary | ICD-10-CM | POA: Insufficient documentation

## 2023-06-29 DIAGNOSIS — I739 Peripheral vascular disease, unspecified: Secondary | ICD-10-CM | POA: Diagnosis not present

## 2023-06-29 DIAGNOSIS — Z7982 Long term (current) use of aspirin: Secondary | ICD-10-CM | POA: Diagnosis not present

## 2023-06-29 DIAGNOSIS — Z79899 Other long term (current) drug therapy: Secondary | ICD-10-CM | POA: Insufficient documentation

## 2023-06-29 DIAGNOSIS — I70212 Atherosclerosis of native arteries of extremities with intermittent claudication, left leg: Secondary | ICD-10-CM | POA: Diagnosis not present

## 2023-06-29 DIAGNOSIS — G8929 Other chronic pain: Secondary | ICD-10-CM

## 2023-06-29 DIAGNOSIS — M79661 Pain in right lower leg: Secondary | ICD-10-CM | POA: Insufficient documentation

## 2023-06-29 DIAGNOSIS — E785 Hyperlipidemia, unspecified: Secondary | ICD-10-CM | POA: Diagnosis not present

## 2023-06-29 DIAGNOSIS — I10 Essential (primary) hypertension: Secondary | ICD-10-CM | POA: Insufficient documentation

## 2023-06-29 DIAGNOSIS — Z7902 Long term (current) use of antithrombotics/antiplatelets: Secondary | ICD-10-CM | POA: Insufficient documentation

## 2023-06-29 HISTORY — PX: ABDOMINAL AORTOGRAM W/LOWER EXTREMITY: CATH118223

## 2023-06-29 HISTORY — PX: PERIPHERAL VASCULAR INTERVENTION: CATH118257

## 2023-06-29 LAB — POCT ACTIVATED CLOTTING TIME
Activated Clotting Time: 263 s
Activated Clotting Time: 275 s

## 2023-06-29 SURGERY — ABDOMINAL AORTOGRAM W/LOWER EXTREMITY
Anesthesia: LOCAL

## 2023-06-29 MED ORDER — LIDOCAINE HCL (PF) 1 % IJ SOLN
INTRAMUSCULAR | Status: DC | PRN
  Administered 2023-06-29: 10 mL

## 2023-06-29 MED ORDER — CLOPIDOGREL BISULFATE 300 MG PO TABS
ORAL_TABLET | ORAL | Status: DC | PRN
  Administered 2023-06-29: 300 mg via ORAL

## 2023-06-29 MED ORDER — ASPIRIN 81 MG PO CHEW
81.0000 mg | CHEWABLE_TABLET | Freq: Every day | ORAL | Status: DC
  Administered 2023-06-30: 81 mg via ORAL
  Filled 2023-06-29: qty 1

## 2023-06-29 MED ORDER — CLOPIDOGREL BISULFATE 75 MG PO TABS: 75.0000 mg | ORAL_TABLET | Freq: Every day | ORAL | Status: DC

## 2023-06-29 MED ORDER — FENTANYL CITRATE (PF) 100 MCG/2ML IJ SOLN
INTRAMUSCULAR | Status: AC
  Filled 2023-06-29: qty 2

## 2023-06-29 MED ORDER — ACETAMINOPHEN 325 MG PO TABS: 650.0000 mg | ORAL_TABLET | ORAL | Status: DC | PRN

## 2023-06-29 MED ORDER — SODIUM CHLORIDE 0.9 % WEIGHT BASED INFUSION: 1.0000 mL/kg/h | INTRAVENOUS | Status: DC

## 2023-06-29 MED ORDER — ALBUTEROL SULFATE HFA 108 (90 BASE) MCG/ACT IN AERS: 2.0000 | INHALATION_SPRAY | Freq: Four times a day (QID) | RESPIRATORY_TRACT | Status: DC | PRN

## 2023-06-29 MED ORDER — SODIUM CHLORIDE 0.9 % WEIGHT BASED INFUSION: 3.0000 mL/kg/h | INTRAVENOUS | Status: DC

## 2023-06-29 MED ORDER — ASPIRIN 81 MG PO TBEC: 81.0000 mg | DELAYED_RELEASE_TABLET | Freq: Every day | ORAL | Status: DC

## 2023-06-29 MED ORDER — FENTANYL CITRATE (PF) 100 MCG/2ML IJ SOLN
INTRAMUSCULAR | Status: DC | PRN
  Administered 2023-06-29: 25 ug via INTRAVENOUS

## 2023-06-29 MED ORDER — LIDOCAINE HCL (PF) 1 % IJ SOLN
INTRAMUSCULAR | Status: AC
  Filled 2023-06-29: qty 30

## 2023-06-29 MED ORDER — HEPARIN SODIUM (PORCINE) 1000 UNIT/ML IJ SOLN
INTRAMUSCULAR | Status: DC | PRN
  Administered 2023-06-29: 7500 [IU] via INTRAVENOUS
  Administered 2023-06-29: 2500 [IU] via INTRAVENOUS

## 2023-06-29 MED ORDER — ATORVASTATIN CALCIUM 80 MG PO TABS
80.0000 mg | ORAL_TABLET | Freq: Every day | ORAL | Status: DC
  Administered 2023-06-30: 80 mg via ORAL
  Filled 2023-06-29: qty 1

## 2023-06-29 MED ORDER — IRBESARTAN 150 MG PO TABS
150.0000 mg | ORAL_TABLET | Freq: Every day | ORAL | Status: DC
  Administered 2023-06-30: 150 mg via ORAL
  Filled 2023-06-29: qty 1

## 2023-06-29 MED ORDER — MORPHINE SULFATE (PF) 2 MG/ML IV SOLN: 2.0000 mg | INTRAVENOUS | Status: DC | PRN

## 2023-06-29 MED ORDER — SODIUM CHLORIDE 0.9 % IV SOLN: INTRAVENOUS | Status: AC

## 2023-06-29 MED ORDER — MIDAZOLAM HCL 2 MG/2ML IJ SOLN
INTRAMUSCULAR | Status: AC
  Filled 2023-06-29: qty 2

## 2023-06-29 MED ORDER — ASPIRIN 81 MG PO CHEW: 81.0000 mg | CHEWABLE_TABLET | ORAL | Status: DC

## 2023-06-29 MED ORDER — HYDRALAZINE HCL 20 MG/ML IJ SOLN: 5.0000 mg | INTRAMUSCULAR | Status: DC | PRN

## 2023-06-29 MED ORDER — MIDAZOLAM HCL 2 MG/2ML IJ SOLN
INTRAMUSCULAR | Status: DC | PRN
  Administered 2023-06-29: 1 mg via INTRAVENOUS

## 2023-06-29 MED ORDER — ASPIRIN 81 MG PO CHEW
81.0000 mg | CHEWABLE_TABLET | ORAL | Status: DC
  Administered 2023-06-29: 81 mg via ORAL

## 2023-06-29 MED ORDER — HEPARIN (PORCINE) IN NACL 1000-0.9 UT/500ML-% IV SOLN
INTRAVENOUS | Status: DC | PRN
  Administered 2023-06-29 (×2): 500 mL

## 2023-06-29 MED ORDER — CLOPIDOGREL BISULFATE 300 MG PO TABS
ORAL_TABLET | ORAL | Status: AC
  Filled 2023-06-29: qty 1

## 2023-06-29 MED ORDER — ONDANSETRON HCL 4 MG/2ML IJ SOLN: 4.0000 mg | Freq: Four times a day (QID) | INTRAMUSCULAR | Status: DC | PRN

## 2023-06-29 MED ORDER — SODIUM CHLORIDE 0.9 % IV SOLN: 250.0000 mL | INTRAVENOUS | Status: DC | PRN

## 2023-06-29 MED ORDER — OLMESARTAN MEDOXOMIL-HCTZ 20-12.5 MG PO TABS: 1.0000 | ORAL_TABLET | Freq: Every day | ORAL | Status: DC

## 2023-06-29 MED ORDER — CLOPIDOGREL BISULFATE 75 MG PO TABS
75.0000 mg | ORAL_TABLET | Freq: Every day | ORAL | Status: DC
  Administered 2023-06-30: 75 mg via ORAL
  Filled 2023-06-29: qty 1

## 2023-06-29 MED ORDER — SODIUM CHLORIDE 0.9 % WEIGHT BASED INFUSION
3.0000 mL/kg/h | INTRAVENOUS | Status: DC
  Administered 2023-06-29: 3 mL/kg/h via INTRAVENOUS

## 2023-06-29 MED ORDER — SODIUM CHLORIDE 0.9% FLUSH
3.0000 mL | Freq: Two times a day (BID) | INTRAVENOUS | Status: DC
  Administered 2023-06-30: 3 mL via INTRAVENOUS

## 2023-06-29 MED ORDER — HEPARIN SODIUM (PORCINE) 1000 UNIT/ML IJ SOLN
INTRAMUSCULAR | Status: AC
  Filled 2023-06-29: qty 10

## 2023-06-29 MED ORDER — HYDROCHLOROTHIAZIDE 12.5 MG PO TABS
12.5000 mg | ORAL_TABLET | Freq: Every day | ORAL | Status: DC
  Administered 2023-06-30: 12.5 mg via ORAL
  Filled 2023-06-29: qty 1

## 2023-06-29 MED ORDER — SODIUM CHLORIDE 0.9% FLUSH: 3.0000 mL | INTRAVENOUS | Status: DC | PRN

## 2023-06-29 MED ORDER — LABETALOL HCL 5 MG/ML IV SOLN: 10.0000 mg | INTRAVENOUS | Status: DC | PRN

## 2023-06-29 MED ORDER — IODIXANOL 320 MG/ML IV SOLN
INTRAVENOUS | Status: DC | PRN
  Administered 2023-06-29: 130 mL via INTRA_ARTERIAL

## 2023-06-29 SURGICAL SUPPLY — 18 items
BAG SNAP BAND KOVER 36X36 (MISCELLANEOUS) IMPLANT
BALLN MUSTANG 5.0X40 75 (BALLOONS) ×2
BALLN MUSTANG 7.0X40 75 (BALLOONS) ×2
BALLOON MUSTANG 5.0X40 75 (BALLOONS) IMPLANT
BALLOON MUSTANG 7.0X40 75 (BALLOONS) IMPLANT
CATH ANGIO 5F PIGTAIL 65CM (CATHETERS) IMPLANT
COVER DOME SNAP 22 D (MISCELLANEOUS) IMPLANT
KIT SYRINGE INJ CVI SPIKEX1 (MISCELLANEOUS) IMPLANT
SET ATX-X65L (MISCELLANEOUS) IMPLANT
SHEATH BRITE TIP 6FR 35CM (SHEATH) IMPLANT
SHEATH PINNACLE 5F 10CM (SHEATH) IMPLANT
SHEATH PINNACLE 6F 10CM (SHEATH) IMPLANT
SHEATH PROBE COVER 6X72 (BAG) IMPLANT
STENT ZILVER PTX 8X40 (Permanent Stent) IMPLANT
TAPE SHOOT N SEE (TAPE) IMPLANT
TRAY PV CATH (CUSTOM PROCEDURE TRAY) ×2 IMPLANT
WIRE HITORQ VERSACORE ST 145CM (WIRE) IMPLANT
WIRE VERSACORE LOC 115CM (WIRE) IMPLANT

## 2023-06-29 NOTE — Progress Notes (Signed)
Site area: left groin arterial sheath Site Prior to Removal:  Level 0 Pressure Applied For:30 minutes Manual:   yes Patient Status During Pull:  stable Post Pull Site:  Level 0 Post Pull Instructions Given:  yes Post Pull Pulses Present: bilateral PT dopplered Dressing Applied:  gauze and tegaderm Bedrest begins @ 1812 Comments:

## 2023-06-29 NOTE — Interval H&P Note (Signed)
History and Physical Interval Note:  06/29/2023 1:19 PM  Tyrone Walton  has presented today for surgery, with the diagnosis of pad.  The various methods of treatment have been discussed with the patient and family. After consideration of risks, benefits and other options for treatment, the patient has consented to  Procedure(s): ABDOMINAL AORTOGRAM W/LOWER EXTREMITY (N/A) as a surgical intervention.  The patient's history has been reviewed, patient examined, no change in status, stable for surgery.  I have reviewed the patient's chart and labs.  Questions were answered to the patient's satisfaction.     [name]

## 2023-06-30 ENCOUNTER — Encounter (HOSPITAL_COMMUNITY): Payer: Self-pay | Admitting: Cardiovascular Disease

## 2023-06-30 DIAGNOSIS — M79662 Pain in left lower leg: Secondary | ICD-10-CM | POA: Diagnosis not present

## 2023-06-30 DIAGNOSIS — M79661 Pain in right lower leg: Secondary | ICD-10-CM | POA: Diagnosis not present

## 2023-06-30 DIAGNOSIS — E785 Hyperlipidemia, unspecified: Secondary | ICD-10-CM | POA: Diagnosis not present

## 2023-06-30 DIAGNOSIS — I739 Peripheral vascular disease, unspecified: Secondary | ICD-10-CM

## 2023-06-30 DIAGNOSIS — Z7902 Long term (current) use of antithrombotics/antiplatelets: Secondary | ICD-10-CM | POA: Diagnosis not present

## 2023-06-30 DIAGNOSIS — Z79899 Other long term (current) drug therapy: Secondary | ICD-10-CM | POA: Diagnosis not present

## 2023-06-30 DIAGNOSIS — I10 Essential (primary) hypertension: Secondary | ICD-10-CM | POA: Diagnosis not present

## 2023-06-30 DIAGNOSIS — I700 Atherosclerosis of aorta: Secondary | ICD-10-CM | POA: Diagnosis not present

## 2023-06-30 DIAGNOSIS — Z7982 Long term (current) use of aspirin: Secondary | ICD-10-CM | POA: Diagnosis not present

## 2023-06-30 LAB — BASIC METABOLIC PANEL
Anion gap: 7 (ref 5–15)
BUN: 11 mg/dL (ref 8–23)
CO2: 23 mmol/L (ref 22–32)
Calcium: 8.4 mg/dL — ABNORMAL LOW (ref 8.9–10.3)
Chloride: 104 mmol/L (ref 98–111)
Creatinine, Ser: 0.83 mg/dL (ref 0.61–1.24)
GFR, Estimated: >60 mL/min (ref >60)
Glucose, Bld: 90 mg/dL (ref 70–99)
Potassium: 3.5 mmol/L (ref 3.5–5.1)
Sodium: 134 mmol/L — ABNORMAL LOW (ref 135–145)

## 2023-06-30 LAB — CBC
HCT: 41.1 % (ref 39.0–52.0)
Hemoglobin: 13.4 g/dL (ref 13.0–17.0)
MCH: 29.3 pg (ref 26.0–34.0)
MCHC: 32.6 g/dL (ref 30.0–36.0)
MCV: 89.7 fL (ref 80.0–100.0)
Platelets: 227 10*3/uL (ref 150–400)
RBC: 4.58 MIL/uL (ref 4.22–5.81)
RDW: 14.4 % (ref 11.5–15.5)
WBC: 7.4 10*3/uL (ref 4.0–10.5)
nRBC: 0 % (ref 0.0–0.2)

## 2023-06-30 LAB — POCT ACTIVATED CLOTTING TIME
Activated Clotting Time: 165 s
Activated Clotting Time: 183 s

## 2023-06-30 LAB — LIPID PANEL
Cholesterol: 150 mg/dL (ref 0–200)
HDL: 58 mg/dL (ref >40)
LDL Cholesterol: 71 mg/dL (ref 0–99)
Total CHOL/HDL Ratio: 2.6 ratio
Triglycerides: 105 mg/dL (ref <150)
VLDL: 21 mg/dL (ref 0–40)

## 2023-06-30 MED ORDER — DICLOFENAC SODIUM 1 % TD GEL: 2.0000 g | Freq: Four times a day (QID) | TRANSDERMAL | Status: AC | PRN

## 2023-06-30 NOTE — Discharge Summary (Signed)
Discharge Summary    Patient ID: Tyrone Walton MRN: [id number]; DOB: 01/31/1951  Admit date: 06/29/2023 Discharge date: 06/30/2023  PCP:  Arnette Felts, FNP   [facility] Providers Cardiologist:  [name], MD     Discharge Diagnoses    Principal Problem:   Claudication in peripheral vascular disease [facility])   Diagnostic Studies/Procedures    PV angiogram: 06/29/2023  Angiographic Data:    1: Abdominal aorta-there was a 40% proximal right renal artery stenosis.  The infrarenal abdominal aorta was mild to moderately atherosclerotic but not aneurysmal or obstructive. 2: Left lower extremity-there was a 90% exophytic plaque in the distal left common iliac artery.  There was 80-90% "in-stent restenosis within the proximal portion of the previously placed stent.  There was a 40 mm gradient across these 2 lesions at rest.  There was a 90% calcific stenosis in the left common femoral artery, 95% ostial profunda femoris stenosis and flush occlusion of the SFA reconstituting at the level of the knee. 3: Right lower extremity-90% calcified right common iliac artery stenosis, widely patent right common and external iliac artery stent, 99% calcified right common femoral artery stenosis extending into the origin of the right SFA, 95% ostial right profunda femoris artery stenosis.  Reconstitution of the popliteal by profunda femoris collaterals that was diffusely diseased.  Final Impression: Successful PTA and drug-eluting stenting of distal left common iliac and proximal portion of previously placed left external iliac artery in-stent restenosis with a drug eluting nitinol self-expanding stent.  The remainder of his of his anatomy would need to be addressed surgically.  I am hoping that the stent will alleviate a significant amount of claudication on the left side which is his most affected side clinically.  The sheath will be removed once ACT falls below 170 pressure will be held.  He  will be gently hydrated overnight and discharged home in the morning on DAPT.  Will obtain lower extremity arterial Doppler studies in our [place] office in the next 1 to 2 weeks and I will see him back as an outpatient thereafter.  He left the lab in stable condition.   [name]. MD, [facility] 06/29/2023 2:39 PM   _____________   History of Present Illness     Tyrone Walton is a 72 y.o. male with PMH of PAD, HTN, HLD and aortic atherosclerosis.  Patient previously underwent angiography by Dr. Allyson Sabal on 04/12/2015 with stenting of the left external iliac artery, he also noted to have long segment of occluded calcified left SFA that was unable to be crossed.  His claudication symptom improved.  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  After the procedure, he had significant improvement of the right ABI as well.  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  Repeat ABI and lower extremity arterial Doppler obtained on 05/09/2023 showed right ABI 0.52, left ABI 0.37.  The left ABI has significantly reduced when compared to the previous ABI.   At his recent office visit on 9/4 he complained of worsening left lower extremity claudication symptoms.  He actually had bilateral claudication symptom, however left side was worse than the right side.  He reported walking from the parking lot to the school will cause bilateral calf pain.  Discharge Summary    Patient ID: Tyrone Walton MRN: [id number]; DOB: 01/31/1951  Admit date: 06/29/2023 Discharge date: 06/30/2023  PCP:  Arnette Felts, FNP   [facility] Providers Cardiologist:  [name], MD     Discharge Diagnoses    Principal Problem:   Claudication in peripheral vascular disease [facility])   Diagnostic Studies/Procedures    PV angiogram: 06/29/2023  Angiographic Data:    1: Abdominal aorta-there was a 40% proximal right renal artery stenosis.  The infrarenal abdominal aorta was mild to moderately atherosclerotic but not aneurysmal or obstructive. 2: Left lower extremity-there was a 90% exophytic plaque in the distal left common iliac artery.  There was 80-90% "in-stent restenosis within the proximal portion of the previously placed stent.  There was a 40 mm gradient across these 2 lesions at rest.  There was a 90% calcific stenosis in the left common femoral artery, 95% ostial profunda femoris stenosis and flush occlusion of the SFA reconstituting at the level of the knee. 3: Right lower extremity-90% calcified right common iliac artery stenosis, widely patent right common and external iliac artery stent, 99% calcified right common femoral artery stenosis extending into the origin of the right SFA, 95% ostial right profunda femoris artery stenosis.  Reconstitution of the popliteal by profunda femoris collaterals that was diffusely diseased.  Final Impression: Successful PTA and drug-eluting stenting of distal left common iliac and proximal portion of previously placed left external iliac artery in-stent restenosis with a drug eluting nitinol self-expanding stent.  The remainder of his of his anatomy would need to be addressed surgically.  I am hoping that the stent will alleviate a significant amount of claudication on the left side which is his most affected side clinically.  The sheath will be removed once ACT falls below 170 pressure will be held.  He  will be gently hydrated overnight and discharged home in the morning on DAPT.  Will obtain lower extremity arterial Doppler studies in our [place] office in the next 1 to 2 weeks and I will see him back as an outpatient thereafter.  He left the lab in stable condition.   [name]. MD, [facility] 06/29/2023 2:39 PM   _____________   History of Present Illness     Tyrone Walton is a 72 y.o. male with PMH of PAD, HTN, HLD and aortic atherosclerosis.  Patient previously underwent angiography by Dr. Allyson Sabal on 04/12/2015 with stenting of the left external iliac artery, he also noted to have long segment of occluded calcified left SFA that was unable to be crossed.  His claudication symptom improved.  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  After the procedure, he had significant improvement of the right ABI as well.  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  Repeat ABI and lower extremity arterial Doppler obtained on 05/09/2023 showed right ABI 0.52, left ABI 0.37.  The left ABI has significantly reduced when compared to the previous ABI.   At his recent office visit on 9/4 he complained of worsening left lower extremity claudication symptoms.  He actually had bilateral claudication symptom, however left side was worse than the right side.  He reported walking from the parking lot to the school will cause bilateral calf pain.  He was still on aspirin and Plavix. His case was reviewed with Dr. Allyson Sabal, with recommendations to proceed with outpatient PV angiogram.   Hospital Course     Underwent successful PTA and drug-eluting stenting of distal left common iliac and proximal portion of previously placed left external iliac artery in-stent restenosis with a drug eluting nitinol  self-expanding stent. The remainder of his of his anatomy would need to be addressed surgically. Plan for DAPT with ASA/plavix. No complications noted overnight.   General: Well developed, well nourished, male appearing in no acute distress. Head: Normocephalic, atraumatic.  Neck: Supple without bruits, JVD. Lungs:  Resp regular and unlabored, CTA. Heart: RRR, S1, S2, no S3, S4, or murmur; no rub. Abdomen: Soft, non-tender, non-distended with normoactive bowel sounds. No hepatomegaly. No rebound/guarding. No obvious abdominal masses. Extremities: No clubbing, cyanosis, edema. Distal pedal pulses are 2+ bilaterally. Left femoral cath site stable without bruising or hematoma Neuro: Alert and oriented X 3. Moves all extremities spontaneously. Psych: Normal affect.  Patient seen by Dr. Clifton James and deemed stable for discharge home. Follow up arranged in the office. Medications unchanged.  _____________  Discharge Vitals Blood pressure (!) 153/74, pulse 65, temperature 97.8 F (36.6 C), temperature source Oral, resp. rate 18, height 5\' 8"  (1.727 m), weight 71.3 kg, SpO2 98%.  Filed Weights   06/29/23 1047 06/29/23 2027  Weight: 70.8 kg 71.3 kg    Labs & Radiologic Studies    CBC Recent Labs    06/30/23 0433  WBC 7.4  HGB 13.4  HCT 41.1  MCV 89.7  PLT 227   Basic Metabolic Panel Recent Labs    21/30/86 0433  NA 134*  K 3.5  CL 104  CO2 23  GLUCOSE 90  BUN 11  CREATININE 0.83  CALCIUM 8.4*   Liver Function Tests No results for input(s): "AST", "ALT", "ALKPHOS", "BILITOT", "PROT", "ALBUMIN" in the last 72 hours. No results for input(s): "LIPASE", "AMYLASE" in the last 72 hours. High Sensitivity Troponin:   No results for input(s): "TROPONINIHS" in the last 720 hours.  BNP Invalid input(s): "POCBNP" D-Dimer No results for input(s): "DDIMER" in the last 72 hours. Hemoglobin A1C No results for input(s): "HGBA1C" in the last 72 hours. Fasting Lipid Panel Recent Labs     06/30/23 0433  CHOL 150  HDL 58  LDLCALC 71  TRIG 105  CHOLHDL 2.6   Thyroid Function Tests No results for input(s): "TSH", "T4TOTAL", "T3FREE", "THYROIDAB" in the last 72 hours.  Invalid input(s): "FREET3" _____________  PERIPHERAL VASCULAR CATHETERIZATION  Result Date: 06/29/2023 Images from the original result were not included.  [id number] LOCATION:  FACILITY: [facility] PHYSICIAN: [name], M.D. 12/05/1950 DATE OF PROCEDURE:  06/29/2023 DATE OF DISCHARGE: PV Angiogram/Intervention History obtained from chart review.Tyrone Walton is a 72 y.o. male with PMH of PAD, HTN, HLD and aortic atherosclerosis.  Patient previously underwent angiography by Dr. Allyson Sabal on 04/12/2015 with stenting of the left external iliac artery, he also noted to have long segment of occluded calcified left SFA that was unable to be crossed.  His claudication symptom improved.  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  After the procedure, he had significant improvement of the right ABI as well.  He was recently seen by Edd Fabian on 04/22/2019 for at which time he continued to be physically active working as a  Discharge Summary    Patient ID: Tyrone Walton MRN: [id number]; DOB: 01/31/1951  Admit date: 06/29/2023 Discharge date: 06/30/2023  PCP:  Arnette Felts, FNP   [facility] Providers Cardiologist:  [name], MD     Discharge Diagnoses    Principal Problem:   Claudication in peripheral vascular disease [facility])   Diagnostic Studies/Procedures    PV angiogram: 06/29/2023  Angiographic Data:    1: Abdominal aorta-there was a 40% proximal right renal artery stenosis.  The infrarenal abdominal aorta was mild to moderately atherosclerotic but not aneurysmal or obstructive. 2: Left lower extremity-there was a 90% exophytic plaque in the distal left common iliac artery.  There was 80-90% "in-stent restenosis within the proximal portion of the previously placed stent.  There was a 40 mm gradient across these 2 lesions at rest.  There was a 90% calcific stenosis in the left common femoral artery, 95% ostial profunda femoris stenosis and flush occlusion of the SFA reconstituting at the level of the knee. 3: Right lower extremity-90% calcified right common iliac artery stenosis, widely patent right common and external iliac artery stent, 99% calcified right common femoral artery stenosis extending into the origin of the right SFA, 95% ostial right profunda femoris artery stenosis.  Reconstitution of the popliteal by profunda femoris collaterals that was diffusely diseased.  Final Impression: Successful PTA and drug-eluting stenting of distal left common iliac and proximal portion of previously placed left external iliac artery in-stent restenosis with a drug eluting nitinol self-expanding stent.  The remainder of his of his anatomy would need to be addressed surgically.  I am hoping that the stent will alleviate a significant amount of claudication on the left side which is his most affected side clinically.  The sheath will be removed once ACT falls below 170 pressure will be held.  He  will be gently hydrated overnight and discharged home in the morning on DAPT.  Will obtain lower extremity arterial Doppler studies in our [place] office in the next 1 to 2 weeks and I will see him back as an outpatient thereafter.  He left the lab in stable condition.   [name]. MD, [facility] 06/29/2023 2:39 PM   _____________   History of Present Illness     Tyrone Walton is a 72 y.o. male with PMH of PAD, HTN, HLD and aortic atherosclerosis.  Patient previously underwent angiography by Dr. Allyson Sabal on 04/12/2015 with stenting of the left external iliac artery, he also noted to have long segment of occluded calcified left SFA that was unable to be crossed.  His claudication symptom improved.  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  After the procedure, he had significant improvement of the right ABI as well.  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  Repeat ABI and lower extremity arterial Doppler obtained on 05/09/2023 showed right ABI 0.52, left ABI 0.37.  The left ABI has significantly reduced when compared to the previous ABI.   At his recent office visit on 9/4 he complained of worsening left lower extremity claudication symptoms.  He actually had bilateral claudication symptom, however left side was worse than the right side.  He reported walking from the parking lot to the school will cause bilateral calf pain.  He was still on aspirin and Plavix. His case was reviewed with Dr. Allyson Sabal, with recommendations to proceed with outpatient PV angiogram.   Hospital Course     Underwent successful PTA and drug-eluting stenting of distal left common iliac and proximal portion of previously placed left external iliac artery in-stent restenosis with a drug eluting nitinol  self-expanding stent. The remainder of his of his anatomy would need to be addressed surgically. Plan for DAPT with ASA/plavix. No complications noted overnight.   General: Well developed, well nourished, male appearing in no acute distress. Head: Normocephalic, atraumatic.  Neck: Supple without bruits, JVD. Lungs:  Resp regular and unlabored, CTA. Heart: RRR, S1, S2, no S3, S4, or murmur; no rub. Abdomen: Soft, non-tender, non-distended with normoactive bowel sounds. No hepatomegaly. No rebound/guarding. No obvious abdominal masses. Extremities: No clubbing, cyanosis, edema. Distal pedal pulses are 2+ bilaterally. Left femoral cath site stable without bruising or hematoma Neuro: Alert and oriented X 3. Moves all extremities spontaneously. Psych: Normal affect.  Patient seen by Dr. Clifton James and deemed stable for discharge home. Follow up arranged in the office. Medications unchanged.  _____________  Discharge Vitals Blood pressure (!) 153/74, pulse 65, temperature 97.8 F (36.6 C), temperature source Oral, resp. rate 18, height 5\' 8"  (1.727 m), weight 71.3 kg, SpO2 98%.  Filed Weights   06/29/23 1047 06/29/23 2027  Weight: 70.8 kg 71.3 kg    Labs & Radiologic Studies    CBC Recent Labs    06/30/23 0433  WBC 7.4  HGB 13.4  HCT 41.1  MCV 89.7  PLT 227   Basic Metabolic Panel Recent Labs    21/30/86 0433  NA 134*  K 3.5  CL 104  CO2 23  GLUCOSE 90  BUN 11  CREATININE 0.83  CALCIUM 8.4*   Liver Function Tests No results for input(s): "AST", "ALT", "ALKPHOS", "BILITOT", "PROT", "ALBUMIN" in the last 72 hours. No results for input(s): "LIPASE", "AMYLASE" in the last 72 hours. High Sensitivity Troponin:   No results for input(s): "TROPONINIHS" in the last 720 hours.  BNP Invalid input(s): "POCBNP" D-Dimer No results for input(s): "DDIMER" in the last 72 hours. Hemoglobin A1C No results for input(s): "HGBA1C" in the last 72 hours. Fasting Lipid Panel Recent Labs     06/30/23 0433  CHOL 150  HDL 58  LDLCALC 71  TRIG 105  CHOLHDL 2.6   Thyroid Function Tests No results for input(s): "TSH", "T4TOTAL", "T3FREE", "THYROIDAB" in the last 72 hours.  Invalid input(s): "FREET3" _____________  PERIPHERAL VASCULAR CATHETERIZATION  Result Date: 06/29/2023 Images from the original result were not included.  [id number] LOCATION:  FACILITY: [facility] PHYSICIAN: [name], M.D. 12/05/1950 DATE OF PROCEDURE:  06/29/2023 DATE OF DISCHARGE: PV Angiogram/Intervention History obtained from chart review.Tyrone Walton is a 72 y.o. male with PMH of PAD, HTN, HLD and aortic atherosclerosis.  Patient previously underwent angiography by Dr. Allyson Sabal on 04/12/2015 with stenting of the left external iliac artery, he also noted to have long segment of occluded calcified left SFA that was unable to be crossed.  His claudication symptom improved.  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  After the procedure, he had significant improvement of the right ABI as well.  He was recently seen by Edd Fabian on 04/22/2019 for at which time he continued to be physically active working as a

## 2023-07-13 ENCOUNTER — Ambulatory Visit (HOSPITAL_BASED_OUTPATIENT_CLINIC_OR_DEPARTMENT_OTHER)
Admission: RE | Admit: 2023-07-13 | Discharge: 2023-07-13 | Disposition: A | Discharge status: Home / Self Care | Payer: Medicare PPO | Source: Ambulatory Visit | Attending: Physician Assistant | Admitting: Physician Assistant

## 2023-07-13 ENCOUNTER — Ambulatory Visit (HOSPITAL_COMMUNITY)
Admission: RE | Admit: 2023-07-13 | Discharge: 2023-07-13 | Disposition: A | Discharge status: Home / Self Care | Payer: Medicare PPO | Source: Ambulatory Visit | Attending: Internal Medicine | Admitting: Internal Medicine

## 2023-07-13 DIAGNOSIS — Z95828 Presence of other vascular implants and grafts: Secondary | ICD-10-CM | POA: Diagnosis not present

## 2023-07-13 DIAGNOSIS — I739 Peripheral vascular disease, unspecified: Secondary | ICD-10-CM

## 2023-07-13 DIAGNOSIS — Z79899 Other long term (current) drug therapy: Secondary | ICD-10-CM | POA: Diagnosis not present

## 2023-07-13 LAB — HEPATIC FUNCTION PANEL
ALT: 27 [IU]/L (ref 0–44)
AST: 25 [IU]/L (ref 0–40)
Albumin: 4.3 g/dL (ref 3.8–4.8)
Alkaline Phosphatase: 78 [IU]/L (ref 44–121)
Bilirubin Total: 0.3 mg/dL (ref 0.0–1.2)
Bilirubin, Direct: 0.12 mg/dL (ref 0.00–0.40)
Total Protein: 6.6 g/dL (ref 6.0–8.5)

## 2023-07-13 LAB — VAS US ABI WITH/WO TBI
Left ABI: 0.46
Right ABI: 0.42

## 2023-07-13 LAB — LIPID PANEL
Chol/HDL Ratio: 2.6 {ratio} (ref 0.0–5.0)
Cholesterol, Total: 164 mg/dL (ref 100–199)
HDL: 63 mg/dL (ref >39)
LDL Chol Calc (NIH): 78 mg/dL (ref 0–99)
Triglycerides: 136 mg/dL (ref 0–149)
VLDL Cholesterol Cal: 23 mg/dL (ref 5–40)

## 2023-07-21 ENCOUNTER — Encounter: Payer: Self-pay | Admitting: Cardiovascular Disease

## 2023-07-21 ENCOUNTER — Ambulatory Visit: Payer: Medicare PPO | Attending: Cardiovascular Disease | Admitting: Cardiovascular Disease

## 2023-07-21 VITALS — BP 138/72 | HR 74 | Ht 68.0 in | Wt 160.0 lb

## 2023-07-21 DIAGNOSIS — I739 Peripheral vascular disease, unspecified: Secondary | ICD-10-CM

## 2023-07-21 NOTE — Patient Instructions (Signed)
Medication Instructions:  Your physician recommends that you continue on your current medications as directed. Please refer to the Current Medication list given to you today.  *If you need a refill on your cardiac medications before your next appointment, please call your pharmacy*   Lab Work: None   Testing/Procedures: None   Follow-Up: At [facility], you and your health needs are our priority.  As part of our continuing mission to provide you with exceptional heart care, we have created designated Provider Care Teams.  These Care Teams include your primary Cardiologist (physician) and Advanced Practice Providers (APPs -  Physician Assistants and Nurse Practitioners) who all work together to provide you with the care you need, when you need it.   Your next appointment:   3 month(s)  Provider:   Jonathan Berry, MD   

## 2023-07-21 NOTE — Progress Notes (Signed)
07/21/2023 Tyrone Walton   [birth year]  [id number]  Primary Physician Arnette Felts, FNP Primary Cardiologist: Runell Gess MD Nicholes Calamity, [place]  HPI:  Tyrone Walton is a 72 y.o.   male with PMH of PAD, HTN, HLD and aortic atherosclerosis.  Patient previously underwent angiography by Dr. Allyson Sabal on 04/12/2015 with stenting of the left external iliac artery, he also noted to have long segment of occluded calcified left SFA that was unable to be crossed.  His claudication symptom improved.  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  After the procedure, he had significant improvement of the right ABI as well.  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  Repeat ABI and lower extremity arterial Doppler obtained on 05/09/2023 showed right ABI 0.52, left ABI 0.37.  The left ABI has significantly reduced when compared to the previous ABI.   Talking with the patient today, he complains of worsening left lower extremity claudication symptoms.  He actually has bilateral claudication symptom, however left side is worse than the right side.  He says walking from the parking lot to the school will cause bilateral calf pain.  He is still on aspirin and Plavix.  I reviewed his case with Dr. 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  Since his procedure a month ago he does note marked improvement in his left lower extremity claudication although he is limited by his right side.  Technical issues involved whether his right common iliac artery stenosis is percutaneously addressable or whether he will need surgical  revascularization.  I will see him back in 3 months which time we will discuss his options.   Current Meds  Medication Sig   acetaminophen (TYLENOL) 325 MG tablet Take 2 tablets (650 mg total) by mouth every 4 (four) hours as needed for headache or mild pain.   albuterol (VENTOLIN HFA) 108 (90 Base) MCG/ACT inhaler INHALE 2 PUFFS INTO THE LUNGS EVERY 6 HOURS AS NEEDED FOR WHEEZING OR SHORTNESS OF BREATH   aspirin 81 MG chewable tablet Chew 1 tablet (81 mg total) by mouth daily.   atorvastatin (LIPITOR) 80 MG tablet Take 1 tablet (80 mg total) by mouth daily.   Benzocaine (BOIL EASE MAXIMUM STRENGTH) 20 % OINT Apply 1 application  topically as needed (irritation).   calcium carbonate (OSCAL) 1500 (600 Ca) MG TABS tablet Take 600 mg of elemental calcium by mouth daily.   clopidogrel (PLAVIX) 75 MG tablet Take 1 tablet (75 mg total) by mouth daily.   cyclobenzaprine (FLEXERIL) 10 MG tablet Take 1 tablet (10 mg total) by mouth 3 (three) times daily as needed for muscle spasms.   diclofenac sodium (VOLTAREN) 1 % GEL Apply 2 g topically 4 (four) times daily as needed (joint pain).   MAGNESIUM OXIDE PO Take 500 mg by mouth daily.    Multiple Vitamins-Minerals (ONE-A-DAY MENS 50+ ADVANTAGE) TABS Take 1 tablet by mouth daily.   olmesartan-hydrochlorothiazide (BENICAR HCT) 20-12.5 MG tablet Take 1 tablet by mouth daily.   tiotropium (SPIRIVA HANDIHALER) 18 MCG inhalation capsule Place 1 capsule (18 mcg total) into inhaler  and inhale daily. (Patient taking differently: Place 18 mcg into inhaler and inhale daily as needed (Wheezing/Irritation).)     No Known Allergies  Social History   Socioeconomic History   Marital status: Married    Spouse name: Not on file   Number of children: Not on file   Years of education: Not on file   Highest education level: Not on file  Occupational History   Occupation: retired   Occupation: part time employed  Tobacco Use   Smoking status: Former    Current  packs/day: 0.00    Average packs/day: 0.5 packs/day for 49.0 years (24.5 ttl pk-yrs)    Types: Cigarettes    Start date: 04/12/1966    Quit date: 04/13/2015    Years since quitting: 8.2   Smokeless tobacco: Never   Tobacco comments:    Currently use Vapor, Quit vaping since December 01, 2020  Vaping Use   Vaping status: Former   Substances: Nicotine, Flavoring  Substance and Sexual Activity   Alcohol use: Yes    Alcohol/week: 6.0 standard drinks of alcohol    Types: 6 Cans of beer per week    Comment: 05/10/2015 "I only drink on the weekends; down from 12 to 6 cans of beer"   Drug use: No   Sexual activity: Yes  Other Topics Concern   Not on file  Social History Narrative   Not on file   Social Determinants of Health   Financial Resource Strain: Low Risk  (04/30/2023)   Overall Financial Resource Strain (CARDIA)    Difficulty of Paying Living Expenses: Not hard at all  Food Insecurity: No Food Insecurity (06/29/2023)   Hunger Vital Sign    Worried About Running Out of Food in the Last Year: Never true    Ran Out of Food in the Last Year: Never true  Transportation Needs: No Transportation Needs (06/29/2023)   PRAPARE - Administrator, Civil Service (Medical): No    Lack of Transportation (Non-Medical): No  Physical Activity: Inactive (04/30/2023)   Exercise Vital Sign    Days of Exercise per Week: 0 days    Minutes of Exercise per Session: 110 min  Stress: No Stress Concern Present (04/30/2023)   [employer] of Occupational Health - Occupational Stress Questionnaire    Feeling of Stress : Not at all  Social Connections: Moderately Isolated (04/30/2023)   Social Connection and Isolation Panel [NHANES]    Frequency of Communication with Friends and Family: More than three times a week    Frequency of Social Gatherings with Friends and Family: Twice a week    Attends Religious Services: Never    Database administrator or Organizations: No    Attends Tax inspector Meetings: Never    Marital Status: Married  Catering manager Violence: Not At Risk (06/29/2023)   Humiliation, Afraid, Rape, and Kick questionnaire    Fear of Current or Ex-Partner: No    Emotionally Abused: No    Physically Abused: No    Sexually Abused: No     Review of Systems: General: negative for chills, fever, night sweats or weight changes.  Cardiovascular: negative for chest pain, dyspnea on exertion, edema, orthopnea, palpitations, paroxysmal nocturnal dyspnea or shortness of breath Dermatological: negative for rash Respiratory: negative for cough or wheezing Urologic: negative for hematuria Abdominal: negative for nausea, vomiting, diarrhea, bright red blood per rectum, melena, or hematemesis Neurologic: negative for visual changes, syncope, or dizziness All other systems reviewed and  are otherwise negative except as noted above.    Blood pressure 138/72, pulse 74, height 5\' 8"  (1.727 m), weight 160 lb (72.6 kg), SpO2 99%.  General appearance: alert and no distress Neck: no adenopathy, no carotid bruit, no JVD, supple, symmetrical, trachea midline, and thyroid not enlarged, symmetric, no tenderness/mass/nodules Lungs: clear to auscultation bilaterally Heart: regular rate and rhythm, S1, S2 normal, no murmur, click, rub or gallop Extremities: extremities normal, atraumatic, no cyanosis or edema Pulses: Absent pedal pulses Skin: Skin color, texture, turgor normal. No rashes or lesions Neurologic: Grossly normal  EKG not performed today      ASSESSMENT AND PLAN:   Peripheral vascular disease [facility]) Mr. Gilham returns today for postprocedure follow-up.  He had left common and external iliac artery stenting by myself 06/29/2023.  I previously placed iliac stents on him 04/12/2015.  His Dopplers performed 05/09/2023 revealed a right ABI of 0.52 and a left of 0.37.  He had a in-stent restenosis within his proximal left extrailiac artery stent as well as distal left  common artery both of which I stented with a drug-eluting stent.  His right extrailiac artery stent was patent.  He did have a 90% calcific eccentric proximal right common iliac artery stenosis, bilateral common femoral and profunda femoris stenoses and occluded SFAs bilaterally.  Since his procedure his left lower extremity claudication has almost completely resolved although his Dopplers did not change much.  He continues to have right lower extremity claudication.  I will see him back in 3 months which time we will discuss endovascular therapy of his right common iliac artery versus surgical referral.     Runell Gess MD [facility], [facility] 07/21/2023 9:07 AM

## 2023-07-21 NOTE — Assessment & Plan Note (Signed)
Tyrone Walton returns today for postprocedure follow-up.  He had left common and external iliac artery stenting by myself 06/29/2023.  I previously placed iliac stents on him 04/12/2015.  His Dopplers performed 05/09/2023 revealed a right ABI of 0.52 and a left of 0.37.  He had a in-stent restenosis within his proximal left extrailiac artery stent as well as distal left common artery both of which I stented with a drug-eluting stent.  His right extrailiac artery stent was patent.  He did have a 90% calcific eccentric proximal right common iliac artery stenosis, bilateral common femoral and profunda femoris stenoses and occluded SFAs bilaterally.  Since his procedure his left lower extremity claudication has almost completely resolved although his Dopplers did not change much.  He continues to have right lower extremity claudication.  I will see him back in 3 months which time we will discuss endovascular therapy of his right common iliac artery versus surgical referral.

## 2023-07-22 ENCOUNTER — Telehealth: Payer: Self-pay

## 2023-07-22 DIAGNOSIS — Z79899 Other long term (current) drug therapy: Secondary | ICD-10-CM

## 2023-07-22 NOTE — Telephone Encounter (Addendum)
Called regarding results, patient had understanding of results. Labs ordered.  ----- Message from Azalee Course sent at 07/14/2023  4:47 PM EDT ----- Improved cholesterol control when compare to 4 month ago. Bad cholesterol still not at goal of <70, recommend work on diet and activity. Repeat FLP and LFT in 6 month. Liver enzyme normal.  Left lower extremity ABI improved slightly after the recent stenting of left common iliac artery.

## 2023-08-07 ENCOUNTER — Other Ambulatory Visit: Payer: Self-pay | Admitting: Nurse Practitioner

## 2023-10-03 ENCOUNTER — Other Ambulatory Visit: Payer: Self-pay | Admitting: Nurse Practitioner

## 2023-10-06 ENCOUNTER — Other Ambulatory Visit: Payer: Self-pay

## 2023-10-06 MED ORDER — CLOPIDOGREL BISULFATE 75 MG PO TABS: 75.0000 mg | ORAL_TABLET | Freq: Every day | ORAL | 1 refills | Status: DC

## 2023-10-21 ENCOUNTER — Encounter: Payer: Self-pay | Admitting: Cardiovascular Disease

## 2023-10-21 ENCOUNTER — Ambulatory Visit: Payer: Medicare PPO | Attending: Cardiovascular Disease | Admitting: Cardiovascular Disease

## 2023-10-21 VITALS — BP 126/68 | HR 66 | Ht 68.0 in | Wt 159.0 lb

## 2023-10-21 DIAGNOSIS — I700 Atherosclerosis of aorta: Secondary | ICD-10-CM

## 2023-10-21 DIAGNOSIS — E782 Mixed hyperlipidemia: Secondary | ICD-10-CM

## 2023-10-21 DIAGNOSIS — I70213 Atherosclerosis of native arteries of extremities with intermittent claudication, bilateral legs: Secondary | ICD-10-CM

## 2023-10-21 DIAGNOSIS — I739 Peripheral vascular disease, unspecified: Secondary | ICD-10-CM

## 2023-10-21 DIAGNOSIS — R0989 Other specified symptoms and signs involving the circulatory and respiratory systems: Secondary | ICD-10-CM

## 2023-10-21 DIAGNOSIS — I119 Hypertensive heart disease without heart failure: Secondary | ICD-10-CM

## 2023-10-21 DIAGNOSIS — I10 Essential (primary) hypertension: Secondary | ICD-10-CM

## 2023-10-21 MED ORDER — CILOSTAZOL 50 MG PO TABS: 50.0000 mg | ORAL_TABLET | Freq: Two times a day (BID) | ORAL | 1 refills | Status: AC

## 2023-10-21 NOTE — Assessment & Plan Note (Signed)
 History of hyperlipidemia on statin therapy with lipid profile performed 07/13/2023 revealing total cholesterol 164, LDL 78 and HDL 63.

## 2023-10-21 NOTE — Assessment & Plan Note (Signed)
 History of severe PAD status post stenting of his left extrailiac artery by myself 04/12/2015.  He does have bilateral SFA occlusions.  He also has a stent in his right extrailiac artery.  I read angiogram to him because of left greater than right lower extremity claudication on 06/29/2023 revealing a 90% distal left common iliac artery stenosis and 80% in-stent restenosis within the [facility] spliced left extrailiac artery stent both of which I stented with an 8 mm x 40 mm long Zilver drug-coated self-expanding stent.  His claudication on the left side markedly improved.  He still has some claudication on the right.  He has a subtotally occluded highly calcified right common femoral artery extending into his profunda as well as a 90% eccentric calcified right common iliac artery stenosis proximal to the previously placed stent.  This is probably only percutaneously addressable via the left brachial approach.  His claudication currently is not lifestyle limiting.  I am going to start him on Pletal  50 mg p.o. twice daily and we will give him a 46-month trial.  I will see him back in 6 months for follow-up.

## 2023-10-21 NOTE — Progress Notes (Signed)
 10/21/2023 Victory CROME Tangney   [birth year]  [id number]  Primary Physician Georgina Speaks, FNP Primary Cardiologist: Dorn JINNY Lesches MD GENI CODY MADEIRA, FSCAI  HPI:  Tyrone Walton is a 73 y.o.   male with PMH of PAD, HTN, HLD and aortic atherosclerosis.  I last saw him in the office 07/21/2023.  Patient previously underwent angiography by Dr. Lesches on 04/12/2015 with stenting of the left external iliac artery, he also noted to have long segment of occluded calcified left SFA that was unable to be crossed.  His claudication symptom improved.  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  After the procedure, he had significant improvement of the right ABI as well.  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  Repeat ABI and lower extremity arterial Doppler obtained on 05/09/2023 showed right ABI 0.52, left ABI 0.37.  The left ABI has significantly reduced when compared to the previous ABI.   Talking with the patient today, he complains of worsening left lower extremity claudication symptoms.  He actually has bilateral claudication symptom, however left side is worse than the right side.  He says walking from the parking lot to the school will cause bilateral calf pain.  He is still on aspirin  and Plavix .  I reviewed his case with Dr. Lesches, I will concerns he may have developed restenosis of left external iliac artery, risk and benefit of lower extremity angiography has been discussed with the patient, he is willing to proceed.   Since his procedure a month ago he does note marked improvement in his left lower extremity claudication although he is limited by his right side.  Technical issues involved whether his right common iliac artery stenosis is percutaneously  addressable or whether he will need surgical revascularization.    Since I saw him 3 months ago he says that his left lower extremity is markedly improved since his recent percutaneous intervention.  He still has mild to moderate right lower extremity claudication which is not necessarily lifestyle limiting at the current time.  Addressing this would require left brachial approach to intervention of his calcified proximal right common iliac artery.  I am going to begin him on Pletal  50 mg p.o. twice daily empirically.  He denies chest pain or shortness of breath.  Current Meds  Medication Sig   acetaminophen  (TYLENOL ) 325 MG tablet Take 2 tablets (650 mg total) by mouth every 4 (four) hours as needed for headache or mild pain.   albuterol  (VENTOLIN  HFA) 108 (90 Base) MCG/ACT inhaler INHALE 2 PUFFS INTO THE LUNGS EVERY 6 HOURS AS NEEDED FOR WHEEZING OR SHORTNESS OF BREATH   aspirin  81 MG chewable tablet Chew 1 tablet (81 mg total) by mouth daily.   Benzocaine (BOIL EASE MAXIMUM STRENGTH) 20 % OINT Apply 1 application  topically as needed (irritation).   calcium  carbonate (OSCAL) 1500 (600 Ca) MG TABS tablet Take 600 mg of elemental calcium  by mouth daily.   clopidogrel  (PLAVIX ) 75 MG tablet Take 1 tablet (75 mg total) by mouth daily.   cyclobenzaprine  (FLEXERIL ) 10 MG tablet Take 1 tablet (10 mg total) by mouth 3 (three) times daily as needed for muscle spasms.   diclofenac  sodium (VOLTAREN ) 1 % GEL Apply 2 g topically 4 (  four) times daily as needed (joint pain).   MAGNESIUM OXIDE PO Take 500 mg by mouth daily.    Multiple Vitamins-Minerals (ONE-A-DAY MENS 50+ ADVANTAGE) TABS Take 1 tablet by mouth daily.   olmesartan -hydrochlorothiazide  (BENICAR  HCT) 20-12.5 MG tablet Take 1 tablet by mouth daily.   tiotropium (SPIRIVA  HANDIHALER) 18 MCG inhalation capsule Place 1 capsule (18 mcg total) into inhaler and inhale daily. (Patient taking differently: Place 18 mcg into inhaler and inhale daily as needed  (Wheezing/Irritation).)     No Known Allergies  Social History   Socioeconomic History   Marital status: Married    Spouse name: Not on file   Number of children: Not on file   Years of education: Not on file   Highest education level: Not on file  Occupational History   Occupation: retired   Occupation: part time employed  Tobacco Use   Smoking status: Former    Current packs/day: 0.00    Average packs/day: 0.5 packs/day for 49.0 years (24.5 ttl pk-yrs)    Types: Cigarettes    Start date: 04/12/1966    Quit date: 04/13/2015    Years since quitting: 8.5   Smokeless tobacco: Never   Tobacco comments:    Currently use Vapor, Quit vaping since December 01, 2020  Vaping Use   Vaping status: Former   Substances: Nicotine, Flavoring  Substance and Sexual Activity   Alcohol use: Yes    Alcohol/week: 6.0 standard drinks of alcohol    Types: 6 Cans of beer per week    Comment: 05/10/2015 I only drink on the weekends; down from 12 to 6 cans of beer   Drug use: No   Sexual activity: Yes  Other Topics Concern   Not on file  Social History Narrative   Not on file   Social Drivers of Health   Financial Resource Strain: Low Risk  (04/30/2023)   Overall Financial Resource Strain (CARDIA)    Difficulty of Paying Living Expenses: Not hard at all  Food Insecurity: No Food Insecurity (06/29/2023)   Hunger Vital Sign    Worried About Running Out of Food in the Last Year: Never true    Ran Out of Food in the Last Year: Never true  Transportation Needs: No Transportation Needs (06/29/2023)   PRAPARE - Administrator, Civil Service (Medical): No    Lack of Transportation (Non-Medical): No  Physical Activity: Inactive (04/30/2023)   Exercise Vital Sign    Days of Exercise per Week: 0 days    Minutes of Exercise per Session: 110 min  Stress: No Stress Concern Present (04/30/2023)   [employer] of Occupational Health - Occupational Stress Questionnaire    Feeling of Stress  : Not at all  Social Connections: Moderately Isolated (04/30/2023)   Social Connection and Isolation Panel [NHANES]    Frequency of Communication with Friends and Family: More than three times a week    Frequency of Social Gatherings with Friends and Family: Twice a week    Attends Religious Services: Never    Database Administrator or Organizations: No    Attends Banker Meetings: Never    Marital Status: Married  Catering Manager Violence: Not At Risk (06/29/2023)   Humiliation, Afraid, Rape, and Kick questionnaire    Fear of Current or Ex-Partner: No    Emotionally Abused: No    Physically Abused: No    Sexually Abused: No     Review of Systems: General: negative for chills, fever,  night sweats or weight changes.  Cardiovascular: negative for chest pain, dyspnea on exertion, edema, orthopnea, palpitations, paroxysmal nocturnal dyspnea or shortness of breath Dermatological: negative for rash Respiratory: negative for cough or wheezing Urologic: negative for hematuria Abdominal: negative for nausea, vomiting, diarrhea, bright red blood per rectum, melena, or hematemesis Neurologic: negative for visual changes, syncope, or dizziness All other systems reviewed and are otherwise negative except as noted above.    Blood pressure 126/68, pulse 66, height 5' 8 (1.727 m), weight 159 lb (72.1 kg), SpO2 99%.  General appearance: alert and no distress Neck: no adenopathy, no carotid bruit, no JVD, supple, symmetrical, trachea midline, and thyroid  not enlarged, symmetric, no tenderness/mass/nodules Lungs: clear to auscultation bilaterally Heart: regular rate and rhythm, S1, S2 normal, no murmur, click, rub or gallop Extremities: extremities normal, atraumatic, no cyanosis or edema Pulses: Absent pedal pulses Skin: Skin color, texture, turgor normal. No rashes or lesions Neurologic: Grossly normal  EKG EKG Interpretation Date/Time:  Wednesday October 21 2023 09:47:59  EST Ventricular Rate:  66 PR Interval:  138 QRS Duration:  94 QT Interval:  386 QTC Calculation: 404 R Axis:   77  Text Interpretation: Sinus rhythm with Premature atrial complexes Cannot rule out Anterior infarct , age undetermined When compared with ECG of 22-Apr-2023 13:52, No significant change was found Confirmed by Court Carrier [phone]) on 10/21/2023 10:11:16 AM    ASSESSMENT AND PLAN:   Atherosclerosis of native arteries of extremity with intermittent claudication (HCC) History of severe PAD status post stenting of his left extrailiac artery by myself 04/12/2015.  He does have bilateral SFA occlusions.  He also has a stent in his right extrailiac artery.  I read angiogram to him because of left greater than right lower extremity claudication on 06/29/2023 revealing a 90% distal left common iliac artery stenosis and 80% in-stent restenosis within the [facility] spliced left extrailiac artery stent both of which I stented with an 8 mm x 40 mm long Zilver drug-coated self-expanding stent.  His claudication on the left side markedly improved.  He still has some claudication on the right.  He has a subtotally occluded highly calcified right common femoral artery extending into his profunda as well as a 90% eccentric calcified right common iliac artery stenosis proximal to the previously placed stent.  This is probably only percutaneously addressable via the left brachial approach.  His claudication currently is not lifestyle limiting.  I am going to start him on Pletal  50 mg p.o. twice daily and we will give him a 31-month trial.  I will see him back in 6 months for follow-up.  Essential hypertension History of essential hypertension her blood pressure measured today at 126/68.  He is on Benicar /hydrochlorothiazide .  Hyperlipidemia History of hyperlipidemia on statin therapy with lipid profile performed 07/13/2023 revealing total cholesterol 164, LDL 78 and HDL 63.     Carrier DOROTHA Court MD  [facility], [facility] 10/21/2023 10:21 AM

## 2023-10-21 NOTE — Assessment & Plan Note (Signed)
 History of essential hypertension her blood pressure measured today at 126/68.  He is on Benicar/hydrochlorothiazide.

## 2023-10-21 NOTE — Patient Instructions (Addendum)
 Medication Instructions:  Your physician has recommended you make the following change in your medication:   -Start taking cilostazol  (pletal ) 50mg  twice daily.  *If you need a refill on your cardiac medications before your next appointment, please call your pharmacy*   Testing/Procedures: Your physician has requested that you have a carotid duplex. This test is an ultrasound of the carotid arteries in your neck. It looks at blood flow through these arteries that supply the brain with blood. Allow one hour for this exam. There are no restrictions or special instructions. This will take place at [address], [address].  Please note: We ask at that you not bring children with you during ultrasound (echo/ vascular) testing. Due to room size and safety concerns, children are not allowed in the ultrasound rooms during exams. Our front office staff cannot provide observation of children in our lobby area while testing is being conducted. An adult accompanying a patient to their appointment will only be allowed in the ultrasound room at the discretion of the ultrasound technician under special circumstances. We apologize for any inconvenience.   Follow-Up: At [facility], you and your health needs are our priority.  As part of our continuing mission to provide you with exceptional heart care, we have created designated Provider Care Teams.  These Care Teams include your primary Cardiologist (physician) and Advanced Practice Providers (APPs -  Physician Assistants and Nurse Practitioners) who all work together to provide you with the care you need, when you need it.  We recommend signing up for the patient portal called MyChart.  Sign up information is provided on this After Visit Summary.  MyChart is used to connect with patients for Virtual Visits (Telemedicine).  Patients are able to view lab/test results, encounter notes, upcoming appointments, etc.  Non-urgent messages can be sent to  your provider as well.   To learn more about what you can do with MyChart, go to [email or web address].    Your next appointment:   6 month(s)  Provider:   Dorn Lesches, MD

## 2023-10-26 ENCOUNTER — Ambulatory Visit (HOSPITAL_COMMUNITY)
Admission: RE | Admit: 2023-10-26 | Payer: Medicare PPO | Source: Ambulatory Visit | Attending: Cardiovascular Disease | Admitting: Cardiovascular Disease

## 2023-11-02 ENCOUNTER — Other Ambulatory Visit: Payer: Self-pay | Admitting: Cardiovascular Disease

## 2023-11-02 DIAGNOSIS — I700 Atherosclerosis of aorta: Secondary | ICD-10-CM

## 2023-11-02 DIAGNOSIS — R0989 Other specified symptoms and signs involving the circulatory and respiratory systems: Secondary | ICD-10-CM

## 2023-11-02 DIAGNOSIS — E782 Mixed hyperlipidemia: Secondary | ICD-10-CM

## 2023-11-02 DIAGNOSIS — I119 Hypertensive heart disease without heart failure: Secondary | ICD-10-CM

## 2023-11-02 DIAGNOSIS — I739 Peripheral vascular disease, unspecified: Secondary | ICD-10-CM

## 2023-11-02 DIAGNOSIS — I10 Essential (primary) hypertension: Secondary | ICD-10-CM

## 2023-11-02 DIAGNOSIS — I70213 Atherosclerosis of native arteries of extremities with intermittent claudication, bilateral legs: Secondary | ICD-10-CM

## 2023-11-06 ENCOUNTER — Ambulatory Visit (HOSPITAL_COMMUNITY)
Admission: RE | Admit: 2023-11-06 | Discharge: 2023-11-06 | Disposition: A | Discharge status: Home / Self Care | Payer: Medicare PPO | Source: Ambulatory Visit | Attending: Internal Medicine | Admitting: Internal Medicine

## 2023-11-06 DIAGNOSIS — R0989 Other specified symptoms and signs involving the circulatory and respiratory systems: Secondary | ICD-10-CM | POA: Insufficient documentation

## 2023-11-29 ENCOUNTER — Other Ambulatory Visit: Payer: Self-pay | Admitting: Nurse Practitioner

## 2023-11-29 DIAGNOSIS — E782 Mixed hyperlipidemia: Secondary | ICD-10-CM

## 2023-12-21 ENCOUNTER — Other Ambulatory Visit: Payer: Self-pay | Admitting: Nurse Practitioner

## 2023-12-21 NOTE — Telephone Encounter (Unsigned)
 Copied from CRM [id number]. Topic: Clinical - Medication Refill >> Dec 21, 2023 10:28 AM Payton Doughty wrote: Most Recent Primary Care Visit:  Provider: Barb Merino  Department: [facility]  Visit Type: MEDICARE AWV, SEQUENTIAL  Date: 04/30/2023  Medication: olmesartan-hydrochlorothiazide (BENICAR HCT) 20-12.5 MG tablet  Has the patient contacted their pharmacy? No Wife calling and states it is too hard to get through to his pharmacy, so she is calling the dr  Is this the correct pharmacy for this prescription? Yes If no, delete pharmacy and type the correct one.  This is the patient's preferred pharmacy:  [facility] [address] [place] - [address] AT [address] [place] [address] Phone: [phone] Fax: [phone]   Has the prescription been filled recently? Yes  Is the patient out of the medication? Yes  Has the patient been seen for an appointment in the last year OR does the patient have an upcoming appointment? Yes  Can we respond through MyChart? No  Agent: Please be advised that Rx refills may take up to 3 business days. We ask that you follow-up with your pharmacy.

## 2023-12-22 MED ORDER — OLMESARTAN MEDOXOMIL-HCTZ 20-12.5 MG PO TABS: 1.0000 | ORAL_TABLET | Freq: Every day | ORAL | 1 refills | Status: DC

## 2023-12-28 ENCOUNTER — Ambulatory Visit: Payer: Self-pay | Admitting: Nurse Practitioner

## 2023-12-28 NOTE — Telephone Encounter (Signed)
  Chief Complaint: right side of neck numbness  Symptoms: tingling in right side of neck  Disposition: [] ED /[] Urgent Care (no appt availability in office) / [x] Appointment(In office/virtual)/ []  [facility]/ [] Home Care/ [] Refused Recommended Disposition /[] [facility]/ []  Follow-up with PCP Additional Notes: Pt called with right sided neck numbness. Pt states this has gone on for a year. When pt turns his head left or tries to sleep on left side, he feels the numbness. Pt denies pain.  Pt states after back surgery, his surgeon told him his neck "has some  discs that will need attention" and has progressed to be more noticeable as time has gone on. Pt denies any numbness in extremities. Pt has appt 3/20 due to work schedule. RN gave care advice and pt verbalized understanding.           Copied from CRM [id number]. Topic: Clinical - Red Word Triage >> Dec 28, 2023 12:07 PM Yolanda T wrote: Red Word that prompted transfer to Nurse Triage: patient experiencing numbness on the right side of his neck Reason for Disposition  [1] Weakness of arm / hand, or leg / foot AND [2] is a chronic symptom (recurrent or ongoing AND present > 4 weeks)  Answer Assessment - Initial Assessment Questions 1. SYMPTOM: "What is the main symptom you are concerned about?" (e.g., weakness, numbness)     Right side of neck becomes numb 2. ONSET: "When did this start?" (minutes, hours, days; while sleeping)     Year  3. LAST NORMAL: "When was the last time you (the patient) were normal (no symptoms)?"     Over   a  year  4. PATTERN "Does this come and go, or has it been constant since it started?"  "Is it present now?"     Turns neck left he feels numbness  5. CARDIAC SYMPTOMS: "Have you had any of the following symptoms: chest pain, difficulty breathing, palpitations?"     Denies  6. NEUROLOGIC SYMPTOMS: "Have you had any of the following symptoms: headache, dizziness, vision loss, double  vision, changes in speech, unsteady on your feet?"     Denies  7. OTHER SYMPTOMS: "Do you have any other symptoms?"     Denies  Protocols used: Neurologic Deficit-A-AH

## 2023-12-31 ENCOUNTER — Ambulatory Visit: Payer: Self-pay | Admitting: Family Medicine

## 2024-02-23 NOTE — Progress Notes (Deleted)
 Del Favia, CMA,acting as a Neurosurgeon for Tyrone Epley, FNP.,have documented all relevant documentation on the behalf of Tyrone Epley, FNP,as directed by  Tyrone Epley, FNP while in the presence of Tyrone Epley, FNP.  Subjective:   Patient ID: Tyrone Walton , male    DOB: [birth year] , 73 y.o.   MRN: [id number]  No chief complaint on file.   HPI  HPI   Past Medical History:  Diagnosis Date   CAD (coronary artery disease)    Chronic left shoulder pain 02/10/2019   Constipation    ED (erectile dysfunction)    Elevated vitamin B12 level 11/24/2022   Will recheck B12 low goals were elevated at last visit.   Hyperlipidemia    Hypertension    Neuromuscular disorder (HCC)    Pain in limb    Peripheral vascular disease (HCC)    claudication   Tobacco abuse 03/13/2015   Tobacco abuse     Family History  Problem Relation Age of Onset   Dementia Mother    Heart failure Mother    Heart attack Brother      Current Outpatient Medications:    acetaminophen  (TYLENOL ) 325 MG tablet, Take 2 tablets (650 mg total) by mouth every 4 (four) hours as needed for headache or mild pain., Disp: , Rfl:    albuterol  (VENTOLIN  HFA) 108 (90 Base) MCG/ACT inhaler, INHALE 2 PUFFS INTO THE LUNGS EVERY 6 HOURS AS NEEDED FOR WHEEZING OR SHORTNESS OF BREATH, Disp: 6.7 g, Rfl: 1   aspirin  81 MG chewable tablet, Chew 1 tablet (81 mg total) by mouth daily., Disp: , Rfl:    atorvastatin  (LIPITOR) 80 MG tablet, Take 1 tablet (80 mg total) by mouth daily., Disp: 90 tablet, Rfl: 3   Benzocaine (BOIL EASE MAXIMUM STRENGTH) 20 % OINT, Apply 1 application  topically as needed (irritation)., Disp: , Rfl:    calcium  carbonate (OSCAL) 1500 (600 Ca) MG TABS tablet, Take 600 mg of elemental calcium  by mouth daily., Disp: , Rfl:    cilostazol  (PLETAL ) 50 MG tablet, Take 1 tablet (50 mg total) by mouth 2 (two) times daily., Disp: 180 tablet, Rfl: 1   clopidogrel  (PLAVIX ) 75 MG tablet, Take 1 tablet (75 mg total) by mouth  daily., Disp: 90 tablet, Rfl: 1   cyclobenzaprine  (FLEXERIL ) 10 MG tablet, Take 1 tablet (10 mg total) by mouth 3 (three) times daily as needed for muscle spasms., Disp: 30 tablet, Rfl: 0   diclofenac  sodium (VOLTAREN ) 1 % GEL, Apply 2 g topically 4 (four) times daily as needed (joint pain)., Disp: , Rfl:    MAGNESIUM OXIDE PO, Take 500 mg by mouth daily. , Disp: , Rfl:    Multiple Vitamins-Minerals (ONE-A-DAY MENS 50+ ADVANTAGE) TABS, Take 1 tablet by mouth daily., Disp: , Rfl:    olmesartan -hydrochlorothiazide  (BENICAR  HCT) 20-12.5 MG tablet, Take 1 tablet by mouth daily., Disp: 90 tablet, Rfl: 1   tiotropium (SPIRIVA  HANDIHALER) 18 MCG inhalation capsule, Place 1 capsule (18 mcg total) into inhaler and inhale daily. (Patient taking differently: Place 18 mcg into inhaler and inhale daily as needed (Wheezing/Irritation).), Disp: 90 each, Rfl: 1   No Known Allergies   Men's preventive visit. Patient Health Questionnaire (PHQ-2) is  Flowsheet Row Clinical Support from 04/30/2023 in [facility]  PHQ-2 Total Score 0     . Patient is on a *** diet. Marital status: Married. Relevant history for alcohol use is:  Social History   Substance and Sexual  Activity  Alcohol Use Yes   Alcohol/week: 6.0 standard drinks of alcohol   Types: 6 Cans of beer per week   Comment: 05/10/2015 "I only drink on the weekends; down from 12 to 6 cans of beer"  . Relevant history for tobacco use is:  Social History   Tobacco Use  Smoking Status Former   Current packs/day: 0.00   Average packs/day: 0.5 packs/day for 49.0 years (24.5 ttl pk-yrs)   Types: Cigarettes   Start date: 04/12/1966   Quit date: 04/13/2015   Years since quitting: 8.8  Smokeless Tobacco Never  Tobacco Comments   Currently use Vapor, Quit vaping since December 01, 2020  .   Review of Systems   There were no vitals filed for this visit. There is no height or weight on file to calculate BMI.  Wt Readings  from Last 3 Encounters:  10/21/23 159 lb (72.1 kg)  07/21/23 160 lb (72.6 kg)  06/29/23 157 lb 3 oz (71.3 kg)    Objective:  Physical Exam      Assessment And Plan:    Encounter for annual health examination  Hypertensive heart disease without congestive heart failure  Mixed hyperlipidemia  Other insomnia     No follow-ups on file. Patient was given opportunity to ask questions. Patient verbalized understanding of the plan and was able to repeat key elements of the plan. All questions were answered to their satisfaction.   Tyrone Epley, FNP  I, Tyrone Epley, FNP, have reviewed all documentation for this visit. The documentation on 02/23/24 for the exam, diagnosis, procedures, and orders are all accurate and complete.

## 2024-02-24 ENCOUNTER — Encounter: Payer: Medicare PPO | Admitting: Nurse Practitioner

## 2024-02-24 DIAGNOSIS — Z0000 Encounter for general adult medical examination without abnormal findings: Secondary | ICD-10-CM

## 2024-02-24 DIAGNOSIS — I119 Hypertensive heart disease without heart failure: Secondary | ICD-10-CM

## 2024-02-24 DIAGNOSIS — G4709 Other insomnia: Secondary | ICD-10-CM

## 2024-02-24 DIAGNOSIS — E782 Mixed hyperlipidemia: Secondary | ICD-10-CM

## 2024-02-29 NOTE — Progress Notes (Unsigned)
 Del Favia, CMA,acting as a Neurosurgeon for Tyrone Epley, FNP.,have documented all relevant documentation on the behalf of Tyrone Epley, FNP,as directed by  Tyrone Epley, FNP while in the presence of Tyrone Epley, FNP.  Subjective:  Patient ID: Tyrone Walton , male    DOB: 1951/07/26 , 73 y.o.   MRN: [id number]  Chief Complaint  Patient presents with   Neck Pain    Patient presents today for neck pain that started about 6 months ago. Patient reports he has been forgetting to tell PCP about the neck pain.    Hypertension    Patient presents today for a bp and chol follow up, Patient reports compliance with medication. Patient denies any chest pain, SOB, or headaches. Patient has no concerns today.    Hyperlipidemia   leg cramps    Patient reports he also has leg cramps often.     HPI  Patient presents with neck pain that has been ongoing for over six months. The pain is described as a tingling sensation that occurs when turning the head or lying on the right side. It can also be triggered by sitting up and turning the head in certain ways. The patient reports that the symptoms have gradually worsened over time. Initially, it was just a little tingle, but it has become more pronounced. The patient mentions a history of back surgery, during which the neurosurgeon noted some issues in the neck but decided not to intervene at that time. The patient expresses concern about potential surgical intervention, referencing his wife's experience. Associated symptoms include leg cramps, which is likely attributed to poor circulation. The patient reports that the neck pain impacts daily activities, particularly sleep and watching television.  He is also getting leg cramps to his right leg. Admits he needs to drink more water. He is now on clostazol to see if it improves, he is able to walk further with a little pain and then will ease up. Dr. Katheryne Pane gave him this medication to see if effective.  He is not  wearing compression socks felt like it was cutting her circulation off.      Past Medical History:  Diagnosis Date   CAD (coronary artery disease)    Chronic left shoulder pain 02/10/2019   Constipation    ED (erectile dysfunction)    Elevated vitamin B12 level 11/24/2022   Will recheck B12 low goals were elevated at last visit.   Hyperlipidemia    Hypertension    Neuromuscular disorder (HCC)    Pain in limb    Peripheral vascular disease (HCC)    claudication   Tobacco abuse 03/13/2015   Tobacco abuse     Family History  Problem Relation Age of Onset   Dementia Mother    Heart failure Mother    Heart attack Brother      Current Outpatient Medications:    acetaminophen  (TYLENOL ) 325 MG tablet, Take 2 tablets (650 mg total) by mouth every 4 (four) hours as needed for headache or mild pain., Disp: , Rfl:    albuterol  (VENTOLIN  HFA) 108 (90 Base) MCG/ACT inhaler, INHALE 2 PUFFS INTO THE LUNGS EVERY 6 HOURS AS NEEDED FOR WHEEZING OR SHORTNESS OF BREATH, Disp: 6.7 g, Rfl: 1   aspirin  81 MG chewable tablet, Chew 1 tablet (81 mg total) by mouth daily., Disp: , Rfl:    atorvastatin  (LIPITOR) 80 MG tablet, Take 1 tablet (80 mg total) by mouth daily., Disp: 90 tablet, Rfl: 3   Benzocaine (BOIL  EASE MAXIMUM STRENGTH) 20 % OINT, Apply 1 application  topically as needed (irritation)., Disp: , Rfl:    calcium  carbonate (OSCAL) 1500 (600 Ca) MG TABS tablet, Take 600 mg of elemental calcium  by mouth daily., Disp: , Rfl:    cilostazol  (PLETAL ) 50 MG tablet, Take 1 tablet (50 mg total) by mouth 2 (two) times daily., Disp: 180 tablet, Rfl: 1   clopidogrel  (PLAVIX ) 75 MG tablet, Take 1 tablet (75 mg total) by mouth daily., Disp: 90 tablet, Rfl: 1   diclofenac  sodium (VOLTAREN ) 1 % GEL, Apply 2 g topically 4 (four) times daily as needed (joint pain)., Disp: , Rfl:    MAGNESIUM OXIDE PO, Take 500 mg by mouth daily. , Disp: , Rfl:    Multiple Vitamins-Minerals (ONE-A-DAY MENS 50+ ADVANTAGE) TABS,  Take 1 tablet by mouth daily., Disp: , Rfl:    olmesartan -hydrochlorothiazide  (BENICAR  HCT) 20-12.5 MG tablet, Take 1 tablet by mouth daily., Disp: 90 tablet, Rfl: 1   cyclobenzaprine  (FLEXERIL ) 10 MG tablet, Take 1 tablet (10 mg total) by mouth 3 (three) times daily as needed for muscle spasms., Disp: 30 tablet, Rfl: 0   tiotropium (SPIRIVA  HANDIHALER) 18 MCG inhalation capsule, Place 1 capsule (18 mcg total) into inhaler and inhale daily., Disp: 90 capsule, Rfl: 1   No Known Allergies   Review of Systems  Constitutional: Negative.   Respiratory: Negative.  Negative for shortness of breath.   Cardiovascular: Negative.  Negative for chest pain, palpitations and leg swelling.  Musculoskeletal:  Positive for neck pain.  Neurological: Negative.   Psychiatric/Behavioral:  Negative for sleep disturbance.      Today's Vitals   03/01/24 0831 03/01/24 0859  BP: (!) 140/70 130/70  Pulse: 71   Temp: (!) 97.5 F (36.4 C)   TempSrc: Oral   Weight: 159 lb 6.4 oz (72.3 kg)   Height: 5\' 8"  (1.727 m)   PainSc: 0-No pain    Body mass index is 24.24 kg/m.  Wt Readings from Last 3 Encounters:  03/01/24 159 lb 6.4 oz (72.3 kg)  10/21/23 159 lb (72.1 kg)  07/21/23 160 lb (72.6 kg)     Objective:  Physical Exam Vitals and nursing note reviewed.  Constitutional:      General: He is not in acute distress.    Appearance: Normal appearance.  Neck:     Comments: No abnormal findings on physical exam Cardiovascular:     Rate and Rhythm: Normal rate and regular rhythm.     Pulses:          Carotid pulses are 1+ on the right side and 1+ on the left side.      Dorsalis pedis pulses are 1+ on the right side and 1+ on the left side.     Heart sounds: Normal heart sounds. No murmur heard. Pulmonary:     Effort: Pulmonary effort is normal. No respiratory distress.     Breath sounds: Normal breath sounds. No wheezing.  Musculoskeletal:        General: Normal range of motion.     Cervical back:  Normal range of motion. No tenderness.  Skin:    General: Skin is warm and dry.     Capillary Refill: Capillary refill takes less than 2 seconds.  Neurological:     General: No focal deficit present.     Mental Status: He is alert and oriented to person, place, and time.     Cranial Nerves: No cranial nerve deficit.     Motor:  No weakness.  Psychiatric:        Mood and Affect: Mood normal.        Behavior: Behavior normal.        Thought Content: Thought content normal.        Judgment: Judgment normal.       Assessment And Plan:  Neck pain on right side Assessment & Plan: No acute findings on physical exam however will check carotid dopplers.   Orders: -     US  Carotid Bilateral; Future  Hypertensive heart disease without congestive heart failure Assessment & Plan: Blood pressure is controlled, continue current medications.   Orders: -     BMP8+eGFR  Tingling sensation Assessment & Plan: To his neck, may be related to laying on that side vs cervical issue  Orders: -     US  Carotid Bilateral; Future  Mixed hyperlipidemia Assessment & Plan: Cholesterol levels are stable, continue statin, tolerating well.   Orders: -     Lipid panel  Atherosclerosis of native artery of both lower extremities with intermittent claudication (HCC) Assessment & Plan: Continue Plavix , does have intermittent calf pain. Advised to make sure wearing support socks.   Peripheral vascular disease (HCC) Assessment & Plan: Continue current medications   Leg cramps Assessment & Plan: Continue with medications for circulation and taking magnesium  Orders: -     Cyclobenzaprine  HCl; Take 1 tablet (10 mg total) by mouth 3 (three) times daily as needed for muscle spasms.  Dispense: 30 tablet; Refill: 0  History of tobacco abuse Assessment & Plan: Will check yearly low dose CT scan  Orders: -     CT CHEST LUNG CANCER SCREENING LOW DOSE WO CONTRAST; Future  Pulmonary emphysema,  unspecified emphysema type (HCC) Assessment & Plan: Continue Spiriva   Orders: -     Tiotropium Bromide Monohydrate ; Place 1 capsule (18 mcg total) into inhaler and inhale daily.  Dispense: 90 capsule; Refill: 1    Return in about 6 months (around 09/01/2024) for NEEDS PHY.  Patient was given opportunity to ask questions. Patient verbalized understanding of the plan and was able to repeat key elements of the plan. All questions were answered to their satisfaction.    Inge Mangle, FNP, have reviewed all documentation for this visit. The documentation on 03/01/24 for the exam, diagnosis, procedures, and orders are all accurate and complete.   IF YOU HAVE BEEN REFERRED TO A SPECIALIST, IT MAY TAKE 1-2 WEEKS TO SCHEDULE/PROCESS THE REFERRAL. IF YOU HAVE NOT HEARD FROM US /SPECIALIST IN TWO WEEKS, PLEASE GIVE US  A CALL AT [phone] X [phone].

## 2024-03-01 ENCOUNTER — Encounter: Payer: Self-pay | Admitting: Nurse Practitioner

## 2024-03-01 ENCOUNTER — Ambulatory Visit: Payer: Self-pay | Admitting: Nurse Practitioner

## 2024-03-01 VITALS — BP 130/70 | HR 71 | Temp 97.5°F | Ht 68.0 in | Wt 159.4 lb

## 2024-03-01 DIAGNOSIS — I119 Hypertensive heart disease without heart failure: Secondary | ICD-10-CM | POA: Diagnosis not present

## 2024-03-01 DIAGNOSIS — Z87891 Personal history of nicotine dependence: Secondary | ICD-10-CM | POA: Diagnosis not present

## 2024-03-01 DIAGNOSIS — J439 Emphysema, unspecified: Secondary | ICD-10-CM | POA: Diagnosis not present

## 2024-03-01 DIAGNOSIS — E782 Mixed hyperlipidemia: Secondary | ICD-10-CM

## 2024-03-01 DIAGNOSIS — M542 Cervicalgia: Secondary | ICD-10-CM | POA: Diagnosis not present

## 2024-03-01 DIAGNOSIS — R202 Paresthesia of skin: Secondary | ICD-10-CM | POA: Diagnosis not present

## 2024-03-01 DIAGNOSIS — R252 Cramp and spasm: Secondary | ICD-10-CM | POA: Diagnosis not present

## 2024-03-01 DIAGNOSIS — I739 Peripheral vascular disease, unspecified: Secondary | ICD-10-CM

## 2024-03-01 DIAGNOSIS — I70213 Atherosclerosis of native arteries of extremities with intermittent claudication, bilateral legs: Secondary | ICD-10-CM

## 2024-03-01 MED ORDER — TIOTROPIUM BROMIDE MONOHYDRATE 18 MCG IN CAPS
18.0000 ug | ORAL_CAPSULE | Freq: Every day | RESPIRATORY_TRACT | 1 refills | Status: DC
Start: 2024-03-01 — End: 2024-09-06

## 2024-03-01 MED ORDER — CYCLOBENZAPRINE HCL 10 MG PO TABS: 10.0000 mg | ORAL_TABLET | Freq: Three times a day (TID) | ORAL | 0 refills | Status: AC | PRN

## 2024-03-01 NOTE — Assessment & Plan Note (Signed)
 Continue current medications.

## 2024-03-02 LAB — LIPID PANEL
Chol/HDL Ratio: 2.4 ratio (ref 0.0–5.0)
Cholesterol, Total: 185 mg/dL (ref 100–199)
HDL: 76 mg/dL (ref >39)
LDL Chol Calc (NIH): 94 mg/dL (ref 0–99)
Triglycerides: 83 mg/dL (ref 0–149)
VLDL Cholesterol Cal: 15 mg/dL (ref 5–40)

## 2024-03-02 LAB — BMP8+EGFR
BUN/Creatinine Ratio: 12 (ref 10–24)
BUN: 12 mg/dL (ref 8–27)
CO2: 23 mmol/L (ref 20–29)
Calcium: 9.8 mg/dL (ref 8.6–10.2)
Chloride: 99 mmol/L (ref 96–106)
Creatinine, Ser: 0.98 mg/dL (ref 0.76–1.27)
Glucose: 92 mg/dL (ref 70–99)
Potassium: 4.5 mmol/L (ref 3.5–5.2)
Sodium: 138 mmol/L (ref 134–144)
eGFR: 81 mL/min/{1.73_m2} (ref >59)

## 2024-03-04 ENCOUNTER — Encounter: Payer: Self-pay | Admitting: Nurse Practitioner

## 2024-03-10 ENCOUNTER — Ambulatory Visit
Admission: RE | Admit: 2024-03-10 | Discharge: 2024-03-10 | Disposition: A | Discharge status: Home / Self Care | Source: Ambulatory Visit | Attending: Nurse Practitioner | Admitting: Nurse Practitioner

## 2024-03-10 ENCOUNTER — Ambulatory Visit: Payer: Self-pay | Admitting: Nurse Practitioner

## 2024-03-10 DIAGNOSIS — R202 Paresthesia of skin: Secondary | ICD-10-CM

## 2024-03-10 DIAGNOSIS — Z87891 Personal history of nicotine dependence: Secondary | ICD-10-CM | POA: Diagnosis not present

## 2024-03-10 DIAGNOSIS — Z122 Encounter for screening for malignant neoplasm of respiratory organs: Secondary | ICD-10-CM | POA: Diagnosis not present

## 2024-03-10 DIAGNOSIS — M542 Cervicalgia: Secondary | ICD-10-CM

## 2024-03-10 DIAGNOSIS — I6523 Occlusion and stenosis of bilateral carotid arteries: Secondary | ICD-10-CM | POA: Diagnosis not present

## 2024-03-10 NOTE — Assessment & Plan Note (Signed)
 No acute findings on physical exam however will check carotid dopplers.

## 2024-03-10 NOTE — Assessment & Plan Note (Signed)
 Continue Spiriva

## 2024-03-10 NOTE — Assessment & Plan Note (Signed)
 To his neck, may be related to laying on that side vs cervical issue

## 2024-03-10 NOTE — Assessment & Plan Note (Signed)
 Will check yearly low dose CT scan

## 2024-03-10 NOTE — Assessment & Plan Note (Signed)
 Continue with medications for circulation and taking magnesium

## 2024-03-10 NOTE — Assessment & Plan Note (Signed)
Continue Plavix, does have intermittent calf pain. Advised to make sure wearing support socks.

## 2024-03-10 NOTE — Assessment & Plan Note (Signed)
 Blood pressure is controlled, continue current medications

## 2024-03-10 NOTE — Assessment & Plan Note (Signed)
Cholesterol levels are stable, continue statin, tolerating well.  

## 2024-04-13 ENCOUNTER — Ambulatory Visit

## 2024-04-14 ENCOUNTER — Other Ambulatory Visit: Payer: Self-pay | Admitting: Nurse Practitioner

## 2024-05-27 ENCOUNTER — Other Ambulatory Visit: Payer: Self-pay | Admitting: Nurse Practitioner

## 2024-07-08 ENCOUNTER — Other Ambulatory Visit: Payer: Self-pay | Admitting: Physician Assistant

## 2024-08-26 ENCOUNTER — Ambulatory Visit: Payer: Self-pay

## 2024-08-30 ENCOUNTER — Other Ambulatory Visit: Payer: Self-pay | Admitting: Nurse Practitioner

## 2024-08-30 DIAGNOSIS — J439 Emphysema, unspecified: Secondary | ICD-10-CM

## 2024-09-01 ENCOUNTER — Encounter: Payer: Self-pay | Admitting: Nurse Practitioner

## 2024-09-01 ENCOUNTER — Ambulatory Visit: Payer: Self-pay | Admitting: Nurse Practitioner

## 2024-09-01 VITALS — BP 130/78 | HR 85 | Temp 97.6°F | Ht 68.0 in | Wt 161.8 lb

## 2024-09-01 DIAGNOSIS — Z13228 Encounter for screening for other metabolic disorders: Secondary | ICD-10-CM | POA: Diagnosis not present

## 2024-09-01 DIAGNOSIS — I119 Hypertensive heart disease without heart failure: Secondary | ICD-10-CM | POA: Diagnosis not present

## 2024-09-01 DIAGNOSIS — Z23 Encounter for immunization: Secondary | ICD-10-CM

## 2024-09-01 DIAGNOSIS — E782 Mixed hyperlipidemia: Secondary | ICD-10-CM | POA: Diagnosis not present

## 2024-09-01 DIAGNOSIS — I739 Peripheral vascular disease, unspecified: Secondary | ICD-10-CM | POA: Diagnosis not present

## 2024-09-01 DIAGNOSIS — I700 Atherosclerosis of aorta: Secondary | ICD-10-CM | POA: Diagnosis not present

## 2024-09-01 DIAGNOSIS — J439 Emphysema, unspecified: Secondary | ICD-10-CM | POA: Diagnosis not present

## 2024-09-01 DIAGNOSIS — Z79899 Other long term (current) drug therapy: Secondary | ICD-10-CM | POA: Diagnosis not present

## 2024-09-01 DIAGNOSIS — Z0000 Encounter for general adult medical examination without abnormal findings: Secondary | ICD-10-CM | POA: Diagnosis not present

## 2024-09-01 LAB — POCT URINALYSIS DIP (CLINITEK)
Bilirubin, UA: NEGATIVE
Blood, UA: NEGATIVE
Glucose, UA: NEGATIVE mg/dL
Ketones, POC UA: NEGATIVE mg/dL
Leukocytes, UA: NEGATIVE
Nitrite, UA: NEGATIVE
POC PROTEIN,UA: NEGATIVE
Spec Grav, UA: 1.015 (ref 1.010–1.025)
Urobilinogen, UA: 0.2 U/dL
pH, UA: 7 (ref 5.0–8.0)

## 2024-09-01 NOTE — Assessment & Plan Note (Signed)
 Routine wellness visit with no new complaints or significant changes. Engages in some physical activity. Dietary changes noted. No urinary or gastrointestinal issues. No significant swelling or pain. No recent dental procedures planned. - Continue current medications as prescribed. - Encouraged continued dietary modifications and increased water intake. - Advised against using Q-tips for ear cleaning. - Encouraged follow-up with vascular specialist.

## 2024-09-01 NOTE — Assessment & Plan Note (Signed)
 Blood pressure is controlled, continue current medications.  EKG done no change from previous

## 2024-09-01 NOTE — Progress Notes (Signed)
 1I,Tyrone Walton, CMA,acting as a neurosurgeon for [name], FNP.,have documented all relevant documentation on the behalf of [name], FNP,as directed by  [name], FNP while in the presence of [name], FNP.  Subjective:   Patient ID: Tyrone Walton , male    DOB: 1951-09-21 , 73 y.o.   MRN: [id number]  Chief Complaint  Patient presents with   Annual Exam    Patient presents today for HM, Patient reports compliance with medication. Patient denies any chest pain, SOB, or headaches. Patient has no concerns today.    Leg Pain    Patient reports his leg muscles are sore daily when walking. He reports he walks a lot for work.     HPI  Discussed the use of AI scribe software for clinical note transcription with the patient, who gave verbal consent to proceed.  History of Present Illness Tyrone Walton is a 73 year old male with a history of vascular disease who presents for an annual physical exam.  He experiences right leg pain (cramping), which he attributes to his work that involves a lot of walking. He has tried wearing compression socks but found them uncomfortable and causing more pain. He has not visited his vascular doctor in over a year.  He has a history of vascular disease and recalls a previous procedure where a stent was placed in his left leg, which was successful, but the right leg procedure was incomplete. He was prescribed medication to help with his symptoms, and he notes a perceived improvement, so he continues to take it without any side effects.  He describes an episode of swelling in his feet and toes after a day of extensive walking and activity, which resolved by the next morning. No urinary issues, constipation, or diarrhea.  He has made dietary changes, reducing his intake of country ham and fried foods, and increasing his water consumption while cutting down on sugar in his coffee and soda. He does not engage in regular exercise but mentions being active  with tasks like raking leaves and removing carpet from his bedroom.   Past Medical History:  Diagnosis Date   CAD (coronary artery disease)    Chronic left shoulder pain 02/10/2019   Constipation    ED (erectile dysfunction)    Elevated vitamin B12 level 11/24/2022   Will recheck B12 low goals were elevated at last visit.   Hyperlipidemia    Hypertension    Neuromuscular disorder (HCC)    Pain in limb    Peripheral vascular disease    claudication   Tobacco abuse 03/13/2015   Tobacco abuse     Family History  Problem Relation Age of Onset   Dementia Mother    Heart failure Mother    Heart attack Brother      Current Outpatient Medications:    acetaminophen  (TYLENOL ) 325 MG tablet, Take 2 tablets (650 mg total) by mouth every 4 (four) hours as needed for headache or mild pain., Disp: , Rfl:    albuterol  (VENTOLIN  HFA) 108 (90 Base) MCG/ACT inhaler, INHALE 2 PUFFS INTO THE LUNGS EVERY 6 HOURS AS NEEDED FOR WHEEZING OR SHORTNESS OF BREATH, Disp: 6.7 g, Rfl: 1   aspirin  81 MG chewable tablet, Chew 1 tablet (81 mg total) by mouth daily., Disp: , Rfl:    atorvastatin  (LIPITOR) 80 MG tablet, TAKE 1 TABLET(80 MG) BY MOUTH DAILY, Disp: 90 tablet, Rfl: 0   Benzocaine (BOIL EASE MAXIMUM STRENGTH) 20 % OINT, Apply 1  application  topically as needed (irritation)., Disp: , Rfl:    calcium  carbonate (OSCAL) 1500 (600 Ca) MG TABS tablet, Take 600 mg of elemental calcium  by mouth daily., Disp: , Rfl:    cilostazol  (PLETAL ) 50 MG tablet, Take 1 tablet (50 mg total) by mouth 2 (two) times daily., Disp: 180 tablet, Rfl: 1   clopidogrel  (PLAVIX ) 75 MG tablet, TAKE 1 TABLET(75 MG) BY MOUTH DAILY, Disp: 90 tablet, Rfl: 1   cyclobenzaprine  (FLEXERIL ) 10 MG tablet, Take 1 tablet (10 mg total) by mouth 3 (three) times daily as needed for muscle spasms., Disp: 30 tablet, Rfl: 0   diclofenac  sodium (VOLTAREN ) 1 % GEL, Apply 2 g topically 4 (four) times daily as needed (joint pain)., Disp: , Rfl:     MAGNESIUM OXIDE PO, Take 500 mg by mouth daily. , Disp: , Rfl:    Multiple Vitamins-Minerals (ONE-A-DAY MENS 50+ ADVANTAGE) TABS, Take 1 tablet by mouth daily., Disp: , Rfl:    olmesartan -hydrochlorothiazide  (BENICAR  HCT) 20-12.5 MG tablet, TAKE 1 TABLET BY MOUTH DAILY, Disp: 90 tablet, Rfl: 1   tiotropium (SPIRIVA  HANDIHALER) 18 MCG inhalation capsule, Place 1 capsule (18 mcg total) into inhaler and inhale daily., Disp: 90 capsule, Rfl: 1   No Known Allergies   Men's preventive visit. Patient Health Questionnaire (PHQ-2) is  Flowsheet Row Office Visit from 09/01/2024 in [facility]  PHQ-2 Total Score 0  Patient is on a Regular diet; he has cut back on country ham a whole lot, eating less fried foods. . Exercise: he rakes leaves for his activity. Marital status: Married. Relevant history for alcohol use is:  Social History   Substance and Sexual Activity  Alcohol Use Yes   Alcohol/week: 6.0 standard drinks of alcohol   Types: 6 Cans of beer per week   Comment: 05/10/2015 I only drink on the weekends; down from 12 to 6 cans of beer  . Relevant history for tobacco use is:  Social History   Tobacco Use  Smoking Status Former   Current packs/day: 0.00   Average packs/day: 0.5 packs/day for 49.0 years (24.5 ttl pk-yrs)   Types: Cigarettes   Start date: 04/12/1966   Quit date: 04/13/2015   Years since quitting: 9.3  Smokeless Tobacco Never  Tobacco Comments   Currently use Vapor, Quit vaping since December 01, 2020  .   Review of Systems  Constitutional: Negative.   HENT: Negative.    Eyes: Negative.   Respiratory: Negative.    Cardiovascular: Negative.   Gastrointestinal: Negative.   Endocrine: Negative.   Genitourinary: Negative.   Musculoskeletal:  Positive for myalgias (calf intermittently).  Neurological:  Negative for numbness.  Hematological: Negative.   Psychiatric/Behavioral: Negative.       Today's Vitals   09/01/24 0856  BP:  130/78  Pulse: 85  Temp: 97.6 F (36.4 C)  TempSrc: Oral  Weight: 161 lb 12.8 oz (73.4 kg)  Height: 5' 8 (1.727 m)  PainSc: 0-No pain   Body mass index is 24.6 kg/m.  Wt Readings from Last 3 Encounters:  09/01/24 161 lb 12.8 oz (73.4 kg)  03/01/24 159 lb 6.4 oz (72.3 kg)  10/21/23 159 lb (72.1 kg)    Objective:  Physical Exam Vitals and nursing note reviewed.  Constitutional:      General: He is not in acute distress.    Appearance: Normal appearance.  HENT:     Head: Normocephalic and atraumatic.     Right Ear: Hearing, tympanic membrane,  ear canal and external ear normal. There is no impacted cerumen.     Left Ear: Hearing, tympanic membrane, ear canal and external ear normal. There is no impacted cerumen.     Nose: Nose normal.     Mouth/Throat:     Mouth: Mucous membranes are moist.  Eyes:     Extraocular Movements: Extraocular movements intact.     Conjunctiva/sclera: Conjunctivae normal.     Pupils: Pupils are equal, round, and reactive to light.  Cardiovascular:     Rate and Rhythm: Normal rate and regular rhythm.     Pulses: Normal pulses.     Heart sounds: Normal heart sounds. No murmur heard. Pulmonary:     Effort: Pulmonary effort is normal. No respiratory distress.     Breath sounds: Normal breath sounds. No wheezing.  Abdominal:     General: Abdomen is flat. Bowel sounds are normal. There is no distension.     Palpations: Abdomen is soft. There is no mass.     Tenderness: There is no abdominal tenderness.  Musculoskeletal:        General: No swelling or tenderness. Normal range of motion.     Cervical back: Normal range of motion and neck supple. No tenderness.  Skin:    General: Skin is warm and dry.     Capillary Refill: Capillary refill takes less than 2 seconds.  Neurological:     General: No focal deficit present.     Mental Status: He is alert and oriented to person, place, and time.     Cranial Nerves: No cranial nerve deficit.  Psychiatric:         Mood and Affect: Mood normal.        Behavior: Behavior normal.        Thought Content: Thought content normal.        Judgment: Judgment normal.      Assessment And Plan:    Encounter for annual health examination Assessment & Plan: Routine wellness visit with no new complaints or significant changes. Engages in some physical activity. Dietary changes noted. No urinary or gastrointestinal issues. No significant swelling or pain. No recent dental procedures planned. - Continue current medications as prescribed. - Encouraged continued dietary modifications and increased water intake. - Advised against using Q-tips for ear cleaning. - Encouraged follow-up with vascular specialist.   Hypertensive heart disease without congestive heart failure Assessment & Plan: Blood pressure is controlled, continue current medications.  EKG done no change from previous  Orders: -     EKG 12-Lead -     POCT URINALYSIS DIP (CLINITEK) -     Microalbumin / creatinine urine ratio -     CMP14+EGFR  Mixed hyperlipidemia Assessment & Plan: Cholesterol levels are stable, continue statin, tolerating well.   Orders: -     Lipid panel  Need for influenza vaccination -     Flu vaccine HIGH DOSE PF(Fluzone Trivalent)  Other long term (current) drug therapy -     CBC with Differential/Platelet  Encounter for screening for metabolic disorder -     Hemoglobin A1c  Pulmonary emphysema (HCC) Assessment & Plan: Continue Spiriva , no concerns   Aortic atherosclerosis Assessment & Plan: Continue statin, tolerating well   Claudication  Peripheral vascular disease Assessment & Plan: Chronic peripheral vascular disease with improvement on current medication. Compression socks caused discomfort. No recent specialist follow-up. - Advised wearing compression socks for shorter durations. - Recommended magnesium spray for leg cramps. - Encouraged follow-up with vascular  specialist. - Continue  current medication regimen.     Return for 1 year physical, 6 month bp check. Patient was given opportunity to ask questions. Patient verbalized understanding of the plan and was able to repeat key elements of the plan. All questions were answered to their satisfaction.   [name], FNP  I, [name], FNP, have reviewed all documentation for this visit. The documentation on 09/01/24 for the exam, diagnosis, procedures, and orders are all accurate and complete.

## 2024-09-01 NOTE — Assessment & Plan Note (Signed)
 Continue Spiriva , no concerns

## 2024-09-01 NOTE — Assessment & Plan Note (Signed)
 Chronic peripheral vascular disease with improvement on current medication. Compression socks caused discomfort. No recent specialist follow-up. - Advised wearing compression socks for shorter durations. - Recommended magnesium spray for leg cramps. - Encouraged follow-up with vascular specialist. - Continue current medication regimen.

## 2024-09-01 NOTE — Assessment & Plan Note (Signed)
 Continue statin, tolerating well

## 2024-09-01 NOTE — Assessment & Plan Note (Signed)
Cholesterol levels are stable, continue statin, tolerating well.  

## 2024-09-01 NOTE — Patient Instructions (Signed)
 Health Maintenance  Topic Date Due   Medicare Annual Wellness Visit  04/29/2024   COVID-19 Vaccine (7 - 2025-26 season) 09/17/2024*   Screening for Lung Cancer  03/10/2025   DTaP/Tdap/Td vaccine (2 - Td or Tdap) 03/13/2026   Colon Cancer Screening  05/14/2028   Pneumococcal Vaccine for age over 73  Completed   Flu Shot  Completed   Hepatitis C Screening  Completed   Zoster (Shingles) Vaccine  Completed   Meningitis B Vaccine  Aged Out  *Topic was postponed. The date shown is not the original due date.   You can get magnesium spray for your leg cramps and call your vascular provider to have a f/u

## 2024-09-02 LAB — MICROALBUMIN / CREATININE URINE RATIO
Creatinine, Urine: 34 mg/dL
Microalb/Creat Ratio: 11 mg/g{creat} (ref 0–29)
Microalbumin, Urine: 3.7 ug/mL

## 2024-09-06 ENCOUNTER — Ambulatory Visit

## 2024-09-16 ENCOUNTER — Ambulatory Visit: Payer: Self-pay

## 2024-09-16 DIAGNOSIS — Z0000 Encounter for general adult medical examination without abnormal findings: Secondary | ICD-10-CM | POA: Diagnosis not present

## 2024-09-16 NOTE — Progress Notes (Deleted)
 No chief complaint on file.    Subjective:   Tyrone Walton is a 73 y.o. male who presents for a Medicare Annual Wellness Visit.  Fall Screening Falls in the past year?: 1 Number of falls in past year: 0 Was there an injury with Fall?: 0 Fall Risk Category Calculator: 1 Patient Fall Risk Level: Low Fall Risk  Fall Risk Patient at Risk for Falls Due to: No Fall Risks Fall risk Follow up: Falls evaluation completed    Allergies (verified) Patient has no known allergies.   Current Medications (verified) Outpatient Encounter Medications as of 09/16/2024  Medication Sig   acetaminophen  (TYLENOL ) 325 MG tablet Take 2 tablets (650 mg total) by mouth every 4 (four) hours as needed for headache or mild pain.   albuterol  (VENTOLIN  HFA) 108 (90 Base) MCG/ACT inhaler INHALE 2 PUFFS INTO THE LUNGS EVERY 6 HOURS AS NEEDED FOR WHEEZING OR SHORTNESS OF BREATH   aspirin  81 MG chewable tablet Chew 1 tablet (81 mg total) by mouth daily.   atorvastatin  (LIPITOR) 80 MG tablet TAKE 1 TABLET(80 MG) BY MOUTH DAILY   Benzocaine (BOIL EASE MAXIMUM STRENGTH) 20 % OINT Apply 1 application  topically as needed (irritation).   calcium  carbonate (OSCAL) 1500 (600 Ca) MG TABS tablet Take 600 mg of elemental calcium  by mouth daily.   cilostazol  (PLETAL ) 50 MG tablet Take 1 tablet (50 mg total) by mouth 2 (two) times daily.   clopidogrel  (PLAVIX ) 75 MG tablet TAKE 1 TABLET(75 MG) BY MOUTH DAILY   cyclobenzaprine  (FLEXERIL ) 10 MG tablet Take 1 tablet (10 mg total) by mouth 3 (three) times daily as needed for muscle spasms.   diclofenac  sodium (VOLTAREN ) 1 % GEL Apply 2 g topically 4 (four) times daily as needed (joint pain).   MAGNESIUM OXIDE PO Take 500 mg by mouth daily.    Multiple Vitamins-Minerals (ONE-A-DAY MENS 50+ ADVANTAGE) TABS Take 1 tablet by mouth daily.   olmesartan -hydrochlorothiazide  (BENICAR  HCT) 20-12.5 MG tablet TAKE 1 TABLET BY MOUTH DAILY   Tiotropium Bromide  (SPIRIVA  HANDIHALER) 18 MCG  CAPS PLACE 1 CAPSULE INTO INHALER AND INHALE DAILY   No facility-administered encounter medications on file as of 09/16/2024.    History: Past Medical History:  Diagnosis Date   CAD (coronary artery disease)    Chronic left shoulder pain 02/10/2019   Constipation    ED (erectile dysfunction)    Elevated vitamin B12 level 11/24/2022   Will recheck B12 low goals were elevated at last visit.   Hyperlipidemia    Hypertension    Neuromuscular disorder (HCC)    Pain in limb    Peripheral vascular disease    claudication   Tobacco abuse 03/13/2015   Tobacco abuse   Past Surgical History:  Procedure Laterality Date   ABDOMINAL AORTOGRAM W/LOWER EXTREMITY N/A 06/29/2023   Procedure: ABDOMINAL AORTOGRAM W/LOWER EXTREMITY;  Surgeon: Court Dorn PARAS, MD;  Location: [facility] INVASIVE CV LAB;  Service: Cardiovascular;  Laterality: N/A;   BACK SURGERY     LUMBAR DISC SURGERY  ~ 2013   PERIPHERAL VASCULAR CATHETERIZATION N/A 04/12/2015   Procedure: Lower Extremity Angiography;  Surgeon: Dorn PARAS Court, MD;  Location: [facility] INVASIVE CV LAB;  Service: Cardiovascular;  Laterality: N/A;   PERIPHERAL VASCULAR CATHETERIZATION Right 05/10/2015   Procedure: Peripheral Vascular Intervention;  Surgeon: Dorn PARAS Court, MD;  Location: [facility] INVASIVE CV LAB;  Service: Cardiovascular;  Laterality: Right;  rt ext iliac stent   PERIPHERAL VASCULAR INTERVENTION  06/29/2023   Procedure: PERIPHERAL  VASCULAR INTERVENTION;  Surgeon: Court Dorn PARAS, MD;  Location: [facility] INVASIVE CV LAB;  Service: Cardiovascular;;   Family History  Problem Relation Age of Onset   Dementia Mother    Heart failure Mother    Heart attack Brother    Social History   Occupational History   Occupation: retired   Occupation: part time employed  Tobacco Use   Smoking status: Former    Current packs/day: 0.00    Average packs/day: 0.5 packs/day for 49.0 years (24.5 ttl pk-yrs)    Types: Cigarettes    Start date: 04/12/1966    Quit date:  04/13/2015    Years since quitting: 9.4   Smokeless tobacco: Never   Tobacco comments:    Currently use Vapor, Quit vaping since December 01, 2020  Vaping Use   Vaping status: Former   Substances: Nicotine, Flavoring  Substance and Sexual Activity   Alcohol use: Yes    Alcohol/week: 6.0 standard drinks of alcohol    Types: 6 Cans of beer per week    Comment: 05/10/2015 I only drink on the weekends; down from 12 to 6 cans of beer   Drug use: No   Sexual activity: Yes   Tobacco Counseling Counseling given: Not Answered Tobacco comments: Currently use Vapor, Quit vaping since December 01, 2020  SDOH Screenings   Food Insecurity: No Food Insecurity (06/29/2023)  Housing: Low Risk  (06/29/2023)  Transportation Needs: No Transportation Needs (06/29/2023)  Utilities: Not At Risk (06/29/2023)  Alcohol Screen: Low Risk  (04/30/2023)  Depression (PHQ2-9): Low Risk  (09/01/2024)  Financial Resource Strain: Low Risk  (04/30/2023)  Physical Activity: Inactive (04/30/2023)  Social Connections: Moderately Isolated (04/30/2023)  Stress: No Stress Concern Present (04/30/2023)  Tobacco Use: Medium Risk (09/01/2024)  Health Literacy: Adequate Health Literacy (04/30/2023)   See flowsheets for full screening details  Depression Screen PHQ 2 & 9 Depression Scale- Over the past 2 weeks, how often have you been bothered by any of the following problems? Little interest or pleasure in doing things: 0 Feeling down, depressed, or hopeless (PHQ Adolescent also includes...irritable): 0 PHQ-2 Total Score: 0 Trouble falling or staying asleep, or sleeping too much: 0 Feeling tired or having little energy: 0 Poor appetite or overeating (PHQ Adolescent also includes...weight loss): 0 Feeling bad about yourself - or that you are a failure or have let yourself or your family down: 0 Trouble concentrating on things, such as reading the newspaper or watching television (PHQ Adolescent also includes...like school work):  0 Moving or speaking so slowly that other people could have noticed. Or the opposite - being so fidgety or restless that you have been moving around a lot more than usual: 0 Thoughts that you would be better off dead, or of hurting yourself in some way: 0 PHQ-9 Total Score: 0 If you checked off any problems, how difficult have these problems made it for you to do your work, take care of things at home, or get along with other people?: Not difficult at all     Goals Addressed   None          Objective:    There were no vitals filed for this visit. There is no height or weight on file to calculate BMI.  Hearing/Vision screen No results found. Immunizations and Health Maintenance Health Maintenance  Topic Date Due   Medicare Annual Wellness (AWV)  04/29/2024   COVID-19 Vaccine (7 - 2025-26 season) 09/17/2024 (Originally 06/13/2024)   Lung Cancer Screening  03/10/2025   DTaP/Tdap/Td (2 - Td or Tdap) 03/13/2026   Colonoscopy  05/14/2028   Pneumococcal Vaccine: 50+ Years  Completed   Influenza Vaccine  Completed   Hepatitis C Screening  Completed   Zoster Vaccines- Shingrix  Completed   Meningococcal B Vaccine  Aged Out        Assessment/Plan:  This is a routine wellness examination for Tyrone Walton.  Patient Care Team: Georgina Speaks, FNP as PCP - General (General Practice) Court Dorn PARAS, MD as PCP - Cardiology (Cardiology)  I have personally reviewed and noted the following in the patient's chart:   Medical and social history Use of alcohol, tobacco or illicit drugs  Current medications and supplements including opioid prescriptions. Functional ability and status Nutritional status Physical activity Advanced directives List of other physicians Hospitalizations, surgeries, and ER visits in previous 12 months Vitals Screenings to include cognitive, depression, and falls Referrals and appointments  No orders of the defined types were placed in this encounter.  In  addition, I have reviewed and discussed with patient certain preventive protocols, quality metrics, and best practice recommendations. A written personalized care plan for preventive services as well as general preventive health recommendations were provided to patient.   Tyrone Walton, CMA   09/16/2024   No follow-ups on file.  After Visit Summary: (MyChart) Due to this being a telephonic visit, the after visit summary with patients personalized plan was offered to patient via MyChart   Nurse Notes:

## 2024-10-15 ENCOUNTER — Other Ambulatory Visit: Payer: Self-pay | Admitting: Cardiovascular Disease

## 2024-10-18 ENCOUNTER — Other Ambulatory Visit: Payer: Self-pay

## 2024-10-18 MED ORDER — CLOPIDOGREL BISULFATE 75 MG PO TABS: 75.0000 mg | ORAL_TABLET | Freq: Every day | ORAL | 1 refills | Status: AC

## 2024-10-25 ENCOUNTER — Ambulatory Visit: Attending: Cardiovascular Disease | Admitting: Cardiovascular Disease

## 2024-10-25 ENCOUNTER — Encounter: Payer: Self-pay | Admitting: Cardiovascular Disease

## 2024-10-25 VITALS — BP 132/76 | HR 68 | Ht 68.0 in | Wt 158.6 lb

## 2024-10-25 DIAGNOSIS — I10 Essential (primary) hypertension: Secondary | ICD-10-CM | POA: Diagnosis not present

## 2024-10-25 DIAGNOSIS — E782 Mixed hyperlipidemia: Secondary | ICD-10-CM | POA: Diagnosis not present

## 2024-10-25 DIAGNOSIS — I739 Peripheral vascular disease, unspecified: Secondary | ICD-10-CM

## 2024-10-25 DIAGNOSIS — R0989 Other specified symptoms and signs involving the circulatory and respiratory systems: Secondary | ICD-10-CM | POA: Diagnosis not present

## 2024-10-25 MED ORDER — EZETIMIBE 10 MG PO TABS: 10.0000 mg | ORAL_TABLET | Freq: Every day | ORAL | 3 refills | Status: AC

## 2024-10-25 NOTE — Assessment & Plan Note (Signed)
 History of essential hypertension blood pressure measured today 132/76.  He is on Benicar , and hydrochlorothiazide .

## 2024-10-25 NOTE — Progress Notes (Signed)
 "     10/25/2024 Tyrone Walton   02-May-1951  [id number]  Primary Physician Georgina Speaks, FNP Primary Cardiologist: Dorn JINNY Lesches MD GENI CODY MADEIRA, FSCAI  HPI:  Tyrone Walton is a 74 y.o.    male with PMH of PAD, HTN, HLD and aortic atherosclerosis.  I last saw him in the office 10/21/2023.  Patient previously underwent angiography by Dr. Lesches on 04/12/2015 with stenting of the left external iliac artery, he also noted to have long segment of occluded calcified left SFA that was unable to be crossed.  His claudication symptom improved.  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  After the procedure, he had significant improvement of the right ABI as well.  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  Repeat ABI and lower extremity arterial Doppler obtained on 05/09/2023 showed right ABI 0.52, left ABI 0.37.  The left ABI has significantly reduced when compared to the previous ABI.   Talking with the patient today, he complains of worsening left lower extremity claudication symptoms.  He actually has bilateral claudication symptom, however left side is worse than the right side.  He says walking from the parking lot to the school will cause bilateral calf pain.  He is still on aspirin  and Plavix .  I reviewed his case with Dr. Lesches, I will concerns he may have developed restenosis of left external iliac artery, risk and benefit of lower extremity angiography has been discussed with the patient, he is willing to proceed.   Since his procedure a month ago he does note marked improvement in his left lower extremity claudication although he is limited by his right side.  Technical issues involved whether his right common iliac artery stenosis is percutaneously  addressable or whether he will need surgical revascularization.     Since I saw him a year ago he continues to do well.  He does complain of some claudication right greater than left but it does not appear to be lifestyle-limiting.  He does continue on Pletal .  Should he require intervention on his right common iliac artery I suspect this will need to be done through the left brachial approach.  He denies chest pain or shortness of breath.  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   Active Medications[1]   Allergies[2]  Social History   Socioeconomic History   Marital status: Married    Spouse name: Not on file   Number of children: Not on file   Years of education: Not on file   Highest education level: Not on file  Occupational History   Occupation: retired   Occupation: part time employed  Tobacco Use   Smoking status: Former    Current packs/day: 0.00    Average packs/day: 0.5 packs/day for 49.0 years (24.5 ttl pk-yrs)    Types: Cigarettes    Start date: 04/12/1966    Quit date: 04/13/2015    Years since quitting: 9.5   Smokeless tobacco: Never   Tobacco comments:    Currently use Vapor, Quit vaping since December 01, 2020  Vaping Use   Vaping status: Former   Substances:  Nicotine, Flavoring  Substance and Sexual Activity   Alcohol use: Yes    Alcohol/week: 10.0 standard drinks of alcohol    Types: 10 Cans of beer per week    Comment: 05/10/2015 I only drink on the weekends; down from 12 to 6 cans of beer   Drug use: No   Sexual activity: Yes  Other Topics Concern   Not on file  Social History Narrative   Not on file   Social Drivers of Health   Tobacco Use: Medium Risk (10/25/2024)   Patient History    Smoking Tobacco Use: Former    Smokeless Tobacco Use: Never    Passive Exposure: Not on file  Financial Resource Strain: Low Risk (09/16/2024)    Overall Financial Resource Strain (CARDIA)    Difficulty of Paying Living Expenses: Not hard at all  Food Insecurity: No Food Insecurity (09/16/2024)   Epic    Worried About Programme Researcher, Broadcasting/film/video in the Last Year: Never true    Ran Out of Food in the Last Year: Never true  Recent Concern: Food Insecurity - Food Insecurity Present (09/16/2024)   Epic    Worried About Programme Researcher, Broadcasting/film/video in the Last Year: Sometimes true    Ran Out of Food in the Last Year: Never true  Transportation Needs: No Transportation Needs (09/16/2024)   Epic    Lack of Transportation (Medical): No    Lack of Transportation (Non-Medical): No  Physical Activity: Inactive (09/16/2024)   Exercise Vital Sign    Days of Exercise per Week: 0 days    Minutes of Exercise per Session: 0 min  Stress: No Stress Concern Present (09/16/2024)   [employer] of Occupational Health - Occupational Stress Questionnaire    Feeling of Stress: Not at all  Social Connections: Moderately Isolated (09/16/2024)   Social Connection and Isolation Panel    Frequency of Communication with Friends and Family: More than three times a week    Frequency of Social Gatherings with Friends and Family: Twice a week    Attends Religious Services: Never    Database Administrator or Organizations: No    Attends Banker Meetings: Never    Marital Status: Married  Catering Manager Violence: Not At Risk (09/16/2024)   Epic    Fear of Current or Ex-Partner: No    Emotionally Abused: No    Physically Abused: No    Sexually Abused: No  Depression (PHQ2-9): Low Risk (09/16/2024)   Depression (PHQ2-9)    PHQ-2 Score: 0  Alcohol Screen: Low Risk (09/16/2024)   Alcohol Screen    Last Alcohol Screening Score (AUDIT): 6  Housing: Low Risk (09/16/2024)   Epic    Unable to Pay for Housing in the Last Year: No    Number of Times Moved in the Last Year: 0    Homeless in the Last Year: No  Utilities: Not At Risk (09/16/2024)   Epic    Threatened  with loss of utilities: No  Health Literacy: Inadequate Health Literacy (09/16/2024)   B1300 Health Literacy    Frequency of need for help with medical instructions: Sometimes     Review of Systems: General: negative for chills, fever, night sweats or weight changes.  Cardiovascular: negative for chest pain, dyspnea on exertion, edema, orthopnea, palpitations, paroxysmal nocturnal dyspnea or shortness of breath Dermatological: negative for rash Respiratory: negative for cough or wheezing Urologic: negative for hematuria Abdominal: negative for nausea, vomiting, diarrhea, bright red blood per rectum,  melena, or hematemesis Neurologic: negative for visual changes, syncope, or dizziness All other systems reviewed and are otherwise negative except as noted above.    Blood pressure 132/76, pulse 68, height 5' 8 (1.727 m), weight 158 lb 9.6 oz (71.9 kg), SpO2 95%.  General appearance: alert and no distress Neck: no adenopathy, no carotid bruit, no JVD, supple, symmetrical, trachea midline, and thyroid  not enlarged, symmetric, no tenderness/mass/nodules Lungs: clear to auscultation bilaterally Heart: regular rate and rhythm, S1, S2 normal, no murmur, click, rub or gallop Extremities: extremities normal, atraumatic, no cyanosis or edema Pulses: Absent pedal pulses Skin: Skin color, texture, turgor normal. No rashes or lesions Neurologic: Grossly normal  EKG not performed today      ASSESSMENT AND PLAN:   Peripheral vascular disease History of PAD status post multiple interventions on bilateral iliac arteries beginning in 2016 when I stented his left extrailiac artery and he was also found to have long segment occlusion and calcified left SFA that I was unable to cross.  His claudication did improve at that time.  He underwent repeat angiography 05/08/2015 with the intention to treat his right SFA and was noted to have disease in his entire right external iliac artery which was stented as  well with improvement in his symptoms and ABIs.  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  His last procedure by myself was 06/29/2023 at which time I stented in-stent restenosis within the proximal portion of his left external iliac artery stent and then his distal left common iliac artery with improvement in his left sided claudication symptoms.  He did have a 90% left common femoral artery stenosis, left profundus stenosis as well as a 99% calcified distal right common femoral and profundus stenosis.  He had a 90% calcified proximal right common iliac artery stenosis which was technically difficult to intervene on.  He is on Pletal  which affords him some clinical benefit.  He does have claudication right greater than left but at the current time it is not lifestyle-limiting and he does not wish to pursue intervention.  Essential hypertension History of essential hypertension blood pressure measured today 132/76.  He is on Benicar , and hydrochlorothiazide .  Hyperlipidemia History of hyperlipidemia on high-dose atorvastatin  with lipid profile performed 03/01/2024 revealing total cholesterol 185, LDL 94 and HDL 76, not at goal for secondary prevention.  I am going to add Zetia  10 mg a day and we will recheck a lipid liver profile in 3 months.  He may ultimately need to be on a PCSK9.     Dorn DOROTHA Lesches MD FACP,FACC,FAHA, FSCAI 10/25/2024 8:27 AM     [1]  Current Meds  Medication Sig   acetaminophen  (TYLENOL ) 325 MG tablet Take 2 tablets (650 mg total) by mouth every 4 (four) hours as needed for headache or mild pain.   albuterol  (VENTOLIN  HFA) 108 (90 Base) MCG/ACT inhaler INHALE 2 PUFFS INTO THE LUNGS EVERY 6 HOURS AS NEEDED FOR WHEEZING OR SHORTNESS OF BREATH   aspirin  81 MG chewable tablet Chew 1 tablet (81 mg total) by mouth daily.   atorvastatin  (LIPITOR) 80 MG tablet TAKE 1 TABLET(80 MG) BY MOUTH DAILY   Benzocaine (BOIL EASE MAXIMUM STRENGTH) 20 % OINT Apply 1 application  topically  as needed (irritation).   calcium  carbonate (OSCAL) 1500 (600 Ca) MG TABS tablet Take 600 mg of elemental calcium  by mouth daily.   cilostazol  (PLETAL ) 50 MG tablet Take 1 tablet (50 mg total) by mouth 2 (  two) times daily.   clopidogrel  (PLAVIX ) 75 MG tablet Take 1 tablet (75 mg total) by mouth daily.   cyclobenzaprine  (FLEXERIL ) 10 MG tablet Take 1 tablet (10 mg total) by mouth 3 (three) times daily as needed for muscle spasms.   diclofenac  sodium (VOLTAREN ) 1 % GEL Apply 2 g topically 4 (four) times daily as needed (joint pain).   MAGNESIUM OXIDE PO Take 500 mg by mouth daily.    Multiple Vitamins-Minerals (ONE-A-DAY MENS 50+ ADVANTAGE) TABS Take 1 tablet by mouth daily.   olmesartan -hydrochlorothiazide  (BENICAR  HCT) 20-12.5 MG tablet TAKE 1 TABLET BY MOUTH DAILY   Tiotropium Bromide  (SPIRIVA  HANDIHALER) 18 MCG CAPS PLACE 1 CAPSULE INTO INHALER AND INHALE DAILY  [2] No Known Allergies  "

## 2024-10-25 NOTE — Assessment & Plan Note (Signed)
 History of hyperlipidemia on high-dose atorvastatin  with lipid profile performed 03/01/2024 revealing total cholesterol 185, LDL 94 and HDL 76, not at goal for secondary prevention.  I am going to add Zetia  10 mg a day and we will recheck a lipid liver profile in 3 months.  He may ultimately need to be on a PCSK9.

## 2024-10-25 NOTE — Assessment & Plan Note (Signed)
 History of PAD status post multiple interventions on bilateral iliac arteries beginning in 2016 when I stented his left extrailiac artery and he was also found to have long segment occlusion and calcified left SFA that I was unable to cross.  His claudication did improve at that time.  He underwent repeat angiography 05/08/2015 with the intention to treat his right SFA and was noted to have disease in his entire right external iliac artery which was stented as well with improvement in his symptoms and ABIs.  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  His last procedure by myself was 06/29/2023 at which time I stented in-stent restenosis within the proximal portion of his left external iliac artery stent and then his distal left common iliac artery with improvement in his left sided claudication symptoms.  He did have a 90% left common femoral artery stenosis, left profundus stenosis as well as a 99% calcified distal right common femoral and profundus stenosis.  He had a 90% calcified proximal right common iliac artery stenosis which was technically difficult to intervene on.  He is on Pletal  which affords him some clinical benefit.  He does have claudication right greater than left but at the current time it is not lifestyle-limiting and he does not wish to pursue intervention.

## 2024-10-25 NOTE — Patient Instructions (Signed)
 Medication Instructions:  Your physician has recommended you make the following change in your medication:   -Start taking ezetimibe  (Zetia ) 10mg  once daily.  *If you need a refill on your cardiac medications before your next appointment, please call your pharmacy*  Lab Work: Your physician recommends that you return for lab work in: 3 months for FASTING lipid/liver  If you have labs (blood work) drawn today and your tests are completely normal, you will receive your results only by: MyChart Message (if you have MyChart) OR A paper copy in the mail If you have any lab test that is abnormal or we need to change your treatment, we will call you to review the results.  Testing/Procedures: Your physician has requested that you have an Aorta/Iliac Duplex. This will take place at [address] 4th floor  No food after 11PM the night before.  Water is OK. (Don't drink liquids if you have been instructed not to for ANOTHER test) Avoid foods that produce bowel gas, for 24 hours prior to exam (see below). No breakfast, no chewing gum, no smoking or carbonated beverages. Patient may take morning medications with water. Come in for test at least 15 minutes early to register.  Please note: We ask at that you not bring children with you during ultrasound (echo/ vascular) testing. Due to room size and safety concerns, children are not allowed in the ultrasound rooms during exams. Our front office staff cannot provide observation of children in our lobby area while testing is being conducted. An adult accompanying a patient to their appointment will only be allowed in the ultrasound room at the discretion of the ultrasound technician under special circumstances. We apologize for any inconvenience.  Your physician has requested that you have an ankle brachial index (ABI). During this test an ultrasound and blood pressure cuff are used to evaluate the arteries that supply the arms and legs with blood.  Allow thirty minutes for this exam. There are no restrictions or special instructions. This will take place at [address], 4th floor   Please note: We ask at that you not bring children with you during ultrasound (echo/ vascular) testing. Due to room size and safety concerns, children are not allowed in the ultrasound rooms during exams. Our front office staff cannot provide observation of children in our lobby area while testing is being conducted. An adult accompanying a patient to their appointment will only be allowed in the ultrasound room at the discretion of the ultrasound technician under special circumstances. We apologize for any inconvenience.   Your physician has requested that you have a carotid duplex. This test is an ultrasound of the carotid arteries in your neck. It looks at blood flow through these arteries that supply the brain with blood. Allow one hour for this exam. There are no restrictions or special instructions. This will take place at [address], 4th floor  **To do in May**  Please note: We ask at that you not bring children with you during ultrasound (echo/ vascular) testing. Due to room size and safety concerns, children are not allowed in the ultrasound rooms during exams. Our front office staff cannot provide observation of children in our lobby area while testing is being conducted. An adult accompanying a patient to their appointment will only be allowed in the ultrasound room at the discretion of the ultrasound technician under special circumstances. We apologize for any inconvenience.   Follow-Up: At [facility], you and your health needs are  our priority.  As part of our continuing mission to provide you with exceptional heart care, our providers are all part of one team.  This team includes your primary Cardiologist (physician) and Advanced Practice Providers or APPs (Physician Assistants and Nurse Practitioners) who all work together to provide  you with the care you need, when you need it.  Your next appointment:   12 month(s)  Provider:   Dorn Lesches, MD    We recommend signing up for the patient portal called MyChart.  Sign up information is provided on this After Visit Summary.  MyChart is used to connect with patients for Virtual Visits (Telemedicine).  Patients are able to view lab/test results, encounter notes, upcoming appointments, etc.  Non-urgent messages can be sent to your provider as well.   To learn more about what you can do with MyChart, go to [email or web address].   Other Instructions

## 2024-10-28 NOTE — Patient Instructions (Signed)
 I connected with  Tyrone Walton on 10/28/24 by a audio enabled telemedicine application and verified that I am speaking with the correct person using two identifiers.  Patient Location: Home  Provider Location: Office/Clinic  Persons Participating in Visit: Patient.  I discussed the limitations of evaluation and management by telemedicine. The patient expressed understanding and agreed to proceed.   Vital Signs: Because this visit was a virtual/telehealth visit, some criteria may be missing or patient reported. Any vitals not documented were not able to be obtained and vitals that have been documented are patient reported.

## 2024-11-16 ENCOUNTER — Other Ambulatory Visit: Payer: Self-pay | Admitting: Cardiovascular Disease

## 2024-11-16 NOTE — Progress Notes (Cosign Needed Addendum)
 "  Chief Complaint  Patient presents with   Medicare Wellness     Subjective:   Tyrone Walton is a 74 y.o. male who presents for a Medicare Annual Wellness Visit.  Visit info / Clinical Intake: Medicare Wellness Visit Type:: Subsequent Annual Wellness Visit Persons participating in visit and providing information:: patient Medicare Wellness Visit Mode:: Telephone If telephone:: video declined Since this visit was completed virtually, some vitals may be partially provided or unavailable. Missing vitals are due to the limitations of the virtual format.: Unable to obtain vitals - no equipment If Telephone or Video please confirm:: I connected with patient using audio/video enable telemedicine. I verified patient identity with two identifiers, discussed telehealth limitations, and patient agreed to proceed. Patient Location:: home Provider Location:: office Interpreter Needed?: No Pre-visit prep was completed: no AWV questionnaire completed by patient prior to visit?: no Living arrangements:: lives with spouse/significant other Patient's Overall Health Status Rating: very good Typical amount of pain: some Does pain affect daily life?: (!) yes Are you currently prescribed opioids?: no  Dietary Habits and Nutritional Risks How many meals a day?: 2 Eats fruit and vegetables daily?: (!) no Most meals are obtained by: having others provide food In the last 2 weeks, have you had any of the following?: none Diabetic:: no  Functional Status Activities of Daily Living (to include ambulation/medication): Independent Ambulation: Independent Medication Administration: Independent Home Management (perform basic housework or laundry): Independent Manage your own finances?: yes Primary transportation is: driving Concerns about vision?: no *vision screening is required for WTM* Concerns about hearing?: no  Fall Screening Falls in the past year?: 1 Number of falls in past year: 0 Was  there an injury with Fall?: 0 Fall Risk Category Calculator: 1 Patient Fall Risk Level: Low Fall Risk  Fall Risk Patient at Risk for Falls Due to: No Fall Risks Fall risk Follow up: Falls evaluation completed  Home and Transportation Safety: All rugs have non-skid backing?: yes All stairs or steps have railings?: yes Grab bars in the bathtub or shower?: (!) no Have non-skid surface in bathtub or shower?: yes Good home lighting?: yes Regular seat belt use?: yes Hospital stays in the last year:: no  Cognitive Assessment Difficulty concentrating, remembering, or making decisions? : no Will 6CIT or Mini Cog be Completed: yes What year is it?: 0 points What month is it?: 0 points Give patient an address phrase to remember (5 components): [address] [place] About what time is it?: 0 points Count backwards from 20 to 1: 0 points Say the months of the year in reverse: 4 points Repeat the address phrase from earlier: 0 points 6 CIT Score: 4 points  Advance Directives (For Healthcare) Does Patient Have a Medical Advance Directive?: Yes Does patient want to make changes to medical advance directive?: Yes (ED - send information to MyChart)  Reviewed/Updated  Reviewed/Updated: Reviewed All (Medical, Surgical, Family, Medications, Allergies, Care Teams, Patient Goals); Medical History; Surgical History; Family History; Medications; Allergies; Care Teams; Patient Goals    Allergies (verified) Patient has no known allergies.   Current Medications (verified) Outpatient Encounter Medications as of 09/16/2024  Medication Sig   acetaminophen  (TYLENOL ) 325 MG tablet Take 2 tablets (650 mg total) by mouth every 4 (four) hours as needed for headache or mild pain.   albuterol  (VENTOLIN  HFA) 108 (90 Base) MCG/ACT inhaler INHALE 2 PUFFS INTO THE LUNGS EVERY 6 HOURS AS NEEDED FOR WHEEZING OR SHORTNESS OF BREATH   aspirin  81 MG  chewable tablet Chew 1 tablet (81 mg total) by mouth daily.    Benzocaine (BOIL EASE MAXIMUM STRENGTH) 20 % OINT Apply 1 application  topically as needed (irritation).   calcium  carbonate (OSCAL) 1500 (600 Ca) MG TABS tablet Take 600 mg of elemental calcium  by mouth daily.   cilostazol  (PLETAL ) 50 MG tablet Take 1 tablet (50 mg total) by mouth 2 (two) times daily.   cyclobenzaprine  (FLEXERIL ) 10 MG tablet Take 1 tablet (10 mg total) by mouth 3 (three) times daily as needed for muscle spasms.   diclofenac  sodium (VOLTAREN ) 1 % GEL Apply 2 g topically 4 (four) times daily as needed (joint pain).   MAGNESIUM OXIDE PO Take 500 mg by mouth daily.    Multiple Vitamins-Minerals (ONE-A-DAY MENS 50+ ADVANTAGE) TABS Take 1 tablet by mouth daily.   olmesartan -hydrochlorothiazide  (BENICAR  HCT) 20-12.5 MG tablet TAKE 1 TABLET BY MOUTH DAILY   Tiotropium Bromide  (SPIRIVA  HANDIHALER) 18 MCG CAPS PLACE 1 CAPSULE INTO INHALER AND INHALE DAILY   [DISCONTINUED] atorvastatin  (LIPITOR) 80 MG tablet TAKE 1 TABLET(80 MG) BY MOUTH DAILY   [DISCONTINUED] clopidogrel  (PLAVIX ) 75 MG tablet TAKE 1 TABLET(75 MG) BY MOUTH DAILY   No facility-administered encounter medications on file as of 09/16/2024.    History: Past Medical History:  Diagnosis Date   CAD (coronary artery disease)    Chronic left shoulder pain 02/10/2019   Constipation    ED (erectile dysfunction)    Elevated vitamin B12 level 11/24/2022   Will recheck B12 low goals were elevated at last visit.   Hyperlipidemia    Hypertension    Neuromuscular disorder (HCC)    Pain in limb    Peripheral vascular disease    claudication   Tobacco abuse 03/13/2015   Tobacco abuse   Past Surgical History:  Procedure Laterality Date   ABDOMINAL AORTOGRAM W/LOWER EXTREMITY N/A 06/29/2023   Procedure: ABDOMINAL AORTOGRAM W/LOWER EXTREMITY;  Surgeon: Court Dorn PARAS, MD;  Location: [facility] INVASIVE [facility];  Service: Cardiovascular;  Laterality: N/A;   BACK SURGERY     LUMBAR DISC SURGERY  ~ 2013   PERIPHERAL VASCULAR  CATHETERIZATION N/A 04/12/2015   Procedure: Lower Extremity Angiography;  Surgeon: Dorn PARAS Court, MD;  Location: [facility] INVASIVE CV LAB;  Service: Cardiovascular;  Laterality: N/A;   PERIPHERAL VASCULAR CATHETERIZATION Right 05/10/2015   Procedure: Peripheral Vascular Intervention;  Surgeon: Dorn PARAS Court, MD;  Location: [facility] INVASIVE CV LAB;  Service: Cardiovascular;  Laterality: Right;  rt ext iliac stent   PERIPHERAL VASCULAR INTERVENTION  06/29/2023   Procedure: PERIPHERAL VASCULAR INTERVENTION;  Surgeon: Court Dorn PARAS, MD;  Location: [facility] INVASIVE CV LAB;  Service: Cardiovascular;;   Family History  Problem Relation Age of Onset   Dementia Mother    Heart failure Mother    Heart attack Brother    Social History   Occupational History   Occupation: retired   Occupation: part time employed  Tobacco Use   Smoking status: Former    Current packs/day: 0.00    Average packs/day: 0.5 packs/day for 49.0 years (24.5 ttl pk-yrs)    Types: Cigarettes    Start date: 04/12/1966    Quit date: 04/13/2015    Years since quitting: 9.6   Smokeless tobacco: Never   Tobacco comments:    Currently use Vapor, Quit vaping since December 01, 2020  Vaping Use   Vaping status: Former   Substances: Nicotine, Flavoring  Substance and Sexual Activity   Alcohol use: Yes    Alcohol/week: 10.0  standard drinks of alcohol    Types: 10 Cans of beer per week    Comment: 05/10/2015 I only drink on the weekends; down from 12 to 6 cans of beer   Drug use: No   Sexual activity: Yes   Tobacco Counseling Counseling given: Not Answered Tobacco comments: Currently use Vapor, Quit vaping since December 01, 2020  SDOH Screenings   Food Insecurity: No Food Insecurity (09/16/2024)  Recent Concern: Food Insecurity - Food Insecurity Present (09/16/2024)  Housing: Low Risk (09/16/2024)  Transportation Needs: No Transportation Needs (09/16/2024)  Utilities: Not At Risk (09/16/2024)  Alcohol Screen: Low Risk (09/16/2024)   Depression (PHQ2-9): Low Risk (09/16/2024)  Financial Resource Strain: Low Risk (09/16/2024)  Physical Activity: Inactive (09/16/2024)  Social Connections: Moderately Isolated (09/16/2024)  Stress: No Stress Concern Present (09/16/2024)  Tobacco Use: Medium Risk (10/25/2024)  Health Literacy: Inadequate Health Literacy (09/16/2024)   See flowsheets for full screening details  Depression Screen PHQ 2 & 9 Depression Scale- Over the past 2 weeks, how often have you been bothered by any of the following problems? Little interest or pleasure in doing things: 0 Feeling down, depressed, or hopeless (PHQ Adolescent also includes...irritable): 0 PHQ-2 Total Score: 0 Trouble falling or staying asleep, or sleeping too much: 0 Feeling tired or having little energy: 0 Poor appetite or overeating (PHQ Adolescent also includes...weight loss): 0 Feeling bad about yourself - or that you are a failure or have let yourself or your family down: 0 Trouble concentrating on things, such as reading the newspaper or watching television (PHQ Adolescent also includes...like school work): 0 Moving or speaking so slowly that other people could have noticed. Or the opposite - being so fidgety or restless that you have been moving around a lot more than usual: 0 Thoughts that you would be better off dead, or of hurting yourself in some way: 0 PHQ-9 Total Score: 0 If you checked off any problems, how difficult have these problems made it for you to do your work, take care of things at home, or get along with other people?: Not difficult at all     Goals Addressed               This Visit's Progress     Patient Stated (pt-stated)        Work to retire.             Objective:    Today's Vitals   There is no height or weight on file to calculate BMI.  Hearing/Vision screen No results found. Immunizations and Health Maintenance Health Maintenance  Topic Date Due   Lung Cancer Screening  03/10/2025    COVID-19 Vaccine (7 - Pfizer risk 2025-26 season) 03/18/2025   Medicare Annual Wellness (AWV)  09/16/2025   DTaP/Tdap/Td (2 - Td or Tdap) 03/13/2026   Colonoscopy  05/14/2028   Pneumococcal Vaccine: 50+ Years  Completed   Influenza Vaccine  Completed   Hepatitis C Screening  Completed   Zoster Vaccines- Shingrix  Completed   Meningococcal B Vaccine  Aged Out        Assessment/Plan:  This is a routine wellness examination for Orien.  Patient Care Team: Georgina Speaks, FNP as PCP - General (General Practice) Court Dorn PARAS, MD as PCP - Cardiology (Cardiology)  I have personally reviewed and noted the following in the patients chart:   Medical and social history Use of alcohol, tobacco or illicit drugs  Current medications and supplements including opioid prescriptions. Functional ability  and status Nutritional status Physical activity Advanced directives List of other physicians Hospitalizations, surgeries, and ER visits in previous 12 months Vitals Screenings to include cognitive, depression, and falls Referrals and appointments  No orders of the defined types were placed in this encounter.  In addition, I have reviewed and discussed with patient certain preventive protocols, quality metrics, and best practice recommendations. A written personalized care plan for preventive services as well as general preventive health recommendations were provided to patient.   Kristeen JINNY Lunger, CMA    09/16/2024  Return in 1 year (on 09/16/2025).  After Visit Summary: (MyChart) Due to this being a telephonic visit, the after visit summary with patients personalized plan was offered to patient via MyChart   Nurse Notes:  "

## 2024-11-16 NOTE — Addendum Note (Signed)
 Addended by: GLADIS KRISTEEN PARAS on: 11/16/2024 10:33 AM   Modules accepted: Orders

## 2025-02-23 ENCOUNTER — Ambulatory Visit (HOSPITAL_COMMUNITY)

## 2025-03-02 ENCOUNTER — Ambulatory Visit: Admitting: Nurse Practitioner

## 2025-09-05 ENCOUNTER — Encounter: Payer: Self-pay | Admitting: Nurse Practitioner
